# Patient Record
Sex: Female | Born: 1980 | Race: White | Hispanic: No | Smoking: Never smoker
Health system: Southern US, Community
[De-identification: ages and names within clinical notes are randomized; demographics above are authoritative.]

## PROBLEM LIST (undated history)

## (undated) DIAGNOSIS — D649 Anemia, unspecified: Secondary | ICD-10-CM

## (undated) DIAGNOSIS — X12XXXA Contact with other hot fluids, initial encounter: Secondary | ICD-10-CM

## (undated) DIAGNOSIS — N390 Urinary tract infection, site not specified: Secondary | ICD-10-CM

## (undated) DIAGNOSIS — I959 Hypotension, unspecified: Secondary | ICD-10-CM

## (undated) DIAGNOSIS — F419 Anxiety disorder, unspecified: Secondary | ICD-10-CM

## (undated) DIAGNOSIS — F329 Major depressive disorder, single episode, unspecified: Secondary | ICD-10-CM

## (undated) DIAGNOSIS — T7840XA Allergy, unspecified, initial encounter: Secondary | ICD-10-CM

## (undated) DIAGNOSIS — B019 Varicella without complication: Secondary | ICD-10-CM

## (undated) DIAGNOSIS — F32A Depression, unspecified: Secondary | ICD-10-CM

## (undated) DIAGNOSIS — G43909 Migraine, unspecified, not intractable, without status migrainosus: Secondary | ICD-10-CM

## (undated) DIAGNOSIS — T300 Burn of unspecified body region, unspecified degree: Secondary | ICD-10-CM

## (undated) DIAGNOSIS — E11610 Type 2 diabetes mellitus with diabetic neuropathic arthropathy: Secondary | ICD-10-CM

## (undated) HISTORY — PX: OTHER SURGICAL HISTORY: SHX169

## (undated) HISTORY — PX: TONSILLECTOMY: SUR1361

## (undated) HISTORY — PX: BELOW KNEE LEG AMPUTATION: SUR23

## (undated) HISTORY — DX: Allergy, unspecified, initial encounter: T78.40XA

## (undated) HISTORY — DX: Urinary tract infection, site not specified: N39.0

## (undated) HISTORY — DX: Varicella without complication: B01.9

## (undated) HISTORY — DX: Depression, unspecified: F32.A

## (undated) HISTORY — DX: Migraine, unspecified, not intractable, without status migrainosus: G43.909

## (undated) HISTORY — DX: Type 2 diabetes mellitus with diabetic neuropathic arthropathy: E11.610

## (undated) HISTORY — DX: Contact with other hot fluids, initial encounter: X12.XXXA

## (undated) HISTORY — DX: Burn of unspecified body region, unspecified degree: T30.0

## (undated) HISTORY — DX: Major depressive disorder, single episode, unspecified: F32.9

---

## 1998-05-06 ENCOUNTER — Encounter: Admission: RE | Admit: 1998-05-06 | Discharge: 1998-08-04 | Payer: Self-pay | Admitting: Rheumatology

## 1998-08-31 ENCOUNTER — Other Ambulatory Visit: Admission: RE | Admit: 1998-08-31 | Discharge: 1998-08-31 | Payer: Self-pay | Admitting: *Deleted

## 1999-04-21 ENCOUNTER — Other Ambulatory Visit: Admission: RE | Admit: 1999-04-21 | Discharge: 1999-04-21 | Payer: Self-pay | Admitting: Internal Medicine

## 1999-09-05 ENCOUNTER — Inpatient Hospital Stay (HOSPITAL_COMMUNITY): Admission: RE | Admit: 1999-09-05 | Discharge: 1999-09-10 | Payer: Self-pay | Admitting: Neurosurgery

## 2001-07-22 ENCOUNTER — Other Ambulatory Visit: Admission: RE | Admit: 2001-07-22 | Discharge: 2001-07-22 | Payer: Self-pay | Admitting: Internal Medicine

## 2002-06-17 ENCOUNTER — Encounter: Payer: Self-pay | Admitting: Emergency Medicine

## 2002-06-17 ENCOUNTER — Inpatient Hospital Stay (HOSPITAL_COMMUNITY): Admission: EM | Admit: 2002-06-17 | Discharge: 2002-06-18 | Payer: Self-pay | Admitting: Emergency Medicine

## 2002-06-17 ENCOUNTER — Encounter: Payer: Self-pay | Admitting: *Deleted

## 2002-09-23 ENCOUNTER — Other Ambulatory Visit: Admission: RE | Admit: 2002-09-23 | Discharge: 2002-09-23 | Payer: Self-pay | Admitting: Internal Medicine

## 2003-09-09 ENCOUNTER — Encounter: Admission: RE | Admit: 2003-09-09 | Discharge: 2003-09-09 | Payer: Self-pay | Admitting: *Deleted

## 2003-10-20 ENCOUNTER — Other Ambulatory Visit: Admission: RE | Admit: 2003-10-20 | Discharge: 2003-10-20 | Payer: Self-pay | Admitting: Internal Medicine

## 2003-12-21 ENCOUNTER — Emergency Department (HOSPITAL_COMMUNITY): Admission: AD | Admit: 2003-12-21 | Discharge: 2003-12-21 | Payer: Self-pay | Admitting: Family Medicine

## 2004-01-28 ENCOUNTER — Encounter: Admission: RE | Admit: 2004-01-28 | Discharge: 2004-01-28 | Payer: Self-pay | Admitting: Allergy and Immunology

## 2004-08-01 ENCOUNTER — Emergency Department (HOSPITAL_COMMUNITY): Admission: EM | Admit: 2004-08-01 | Discharge: 2004-08-02 | Payer: Self-pay | Admitting: Emergency Medicine

## 2004-08-17 ENCOUNTER — Emergency Department (HOSPITAL_COMMUNITY): Admission: AC | Admit: 2004-08-17 | Discharge: 2004-08-17 | Payer: Self-pay

## 2004-09-30 ENCOUNTER — Inpatient Hospital Stay (HOSPITAL_COMMUNITY): Admission: AD | Admit: 2004-09-30 | Discharge: 2004-10-04 | Payer: Self-pay | Admitting: Internal Medicine

## 2004-12-20 ENCOUNTER — Other Ambulatory Visit: Admission: RE | Admit: 2004-12-20 | Discharge: 2004-12-20 | Payer: Self-pay | Admitting: Internal Medicine

## 2005-03-02 ENCOUNTER — Encounter: Admission: RE | Admit: 2005-03-02 | Discharge: 2005-05-31 | Payer: Self-pay | Admitting: Endocrinology

## 2005-03-15 ENCOUNTER — Emergency Department (HOSPITAL_COMMUNITY): Admission: EM | Admit: 2005-03-15 | Discharge: 2005-03-16 | Payer: Self-pay | Admitting: Emergency Medicine

## 2005-11-18 ENCOUNTER — Emergency Department (HOSPITAL_COMMUNITY): Admission: EM | Admit: 2005-11-18 | Discharge: 2005-11-19 | Payer: Self-pay | Admitting: Emergency Medicine

## 2005-11-22 ENCOUNTER — Inpatient Hospital Stay (HOSPITAL_COMMUNITY): Admission: EM | Admit: 2005-11-22 | Discharge: 2005-11-24 | Payer: Self-pay | Admitting: Emergency Medicine

## 2005-12-06 ENCOUNTER — Emergency Department (HOSPITAL_COMMUNITY): Admission: EM | Admit: 2005-12-06 | Discharge: 2005-12-06 | Payer: Self-pay | Admitting: Emergency Medicine

## 2005-12-21 ENCOUNTER — Other Ambulatory Visit: Admission: RE | Admit: 2005-12-21 | Discharge: 2005-12-21 | Payer: Self-pay | Admitting: Internal Medicine

## 2006-01-02 ENCOUNTER — Emergency Department (HOSPITAL_COMMUNITY): Admission: EM | Admit: 2006-01-02 | Discharge: 2006-01-02 | Payer: Self-pay | Admitting: Emergency Medicine

## 2006-01-07 ENCOUNTER — Emergency Department (HOSPITAL_COMMUNITY): Admission: EM | Admit: 2006-01-07 | Discharge: 2006-01-08 | Payer: Self-pay | Admitting: Emergency Medicine

## 2006-01-10 ENCOUNTER — Emergency Department (HOSPITAL_COMMUNITY): Admission: EM | Admit: 2006-01-10 | Discharge: 2006-01-10 | Payer: Self-pay | Admitting: Emergency Medicine

## 2006-01-14 ENCOUNTER — Emergency Department (HOSPITAL_COMMUNITY): Admission: EM | Admit: 2006-01-14 | Discharge: 2006-01-15 | Payer: Self-pay | Admitting: Emergency Medicine

## 2006-07-24 ENCOUNTER — Ambulatory Visit (HOSPITAL_COMMUNITY): Admission: RE | Admit: 2006-07-24 | Discharge: 2006-07-24 | Payer: Self-pay | Admitting: Obstetrics and Gynecology

## 2006-09-01 ENCOUNTER — Inpatient Hospital Stay (HOSPITAL_COMMUNITY): Admission: AD | Admit: 2006-09-01 | Discharge: 2006-09-01 | Payer: Self-pay | Admitting: Obstetrics and Gynecology

## 2006-10-08 ENCOUNTER — Inpatient Hospital Stay (HOSPITAL_COMMUNITY): Admission: AD | Admit: 2006-10-08 | Discharge: 2006-10-08 | Payer: Self-pay | Admitting: Obstetrics and Gynecology

## 2006-10-09 ENCOUNTER — Inpatient Hospital Stay (HOSPITAL_COMMUNITY): Admission: AD | Admit: 2006-10-09 | Discharge: 2006-10-12 | Payer: Self-pay | Admitting: Obstetrics and Gynecology

## 2006-10-10 ENCOUNTER — Encounter: Payer: Self-pay | Admitting: Vascular Surgery

## 2006-10-14 ENCOUNTER — Inpatient Hospital Stay (HOSPITAL_COMMUNITY): Admission: AD | Admit: 2006-10-14 | Discharge: 2006-10-14 | Payer: Self-pay | Admitting: Obstetrics and Gynecology

## 2006-10-16 ENCOUNTER — Inpatient Hospital Stay (HOSPITAL_COMMUNITY): Admission: AD | Admit: 2006-10-16 | Discharge: 2006-10-17 | Payer: Self-pay | Admitting: Obstetrics and Gynecology

## 2006-10-31 ENCOUNTER — Inpatient Hospital Stay (HOSPITAL_COMMUNITY): Admission: AD | Admit: 2006-10-31 | Discharge: 2006-11-01 | Payer: Self-pay | Admitting: Obstetrics and Gynecology

## 2006-11-08 ENCOUNTER — Inpatient Hospital Stay (HOSPITAL_COMMUNITY): Admission: AD | Admit: 2006-11-08 | Discharge: 2006-11-14 | Payer: Self-pay | Admitting: Obstetrics and Gynecology

## 2006-11-09 ENCOUNTER — Encounter (INDEPENDENT_AMBULATORY_CARE_PROVIDER_SITE_OTHER): Payer: Self-pay | Admitting: Specialist

## 2007-12-23 ENCOUNTER — Inpatient Hospital Stay (HOSPITAL_COMMUNITY): Admission: EM | Admit: 2007-12-23 | Discharge: 2007-12-24 | Payer: Self-pay | Admitting: Emergency Medicine

## 2009-03-07 ENCOUNTER — Inpatient Hospital Stay (HOSPITAL_COMMUNITY): Admission: EM | Admit: 2009-03-07 | Discharge: 2009-03-09 | Payer: Self-pay | Admitting: Emergency Medicine

## 2010-07-01 ENCOUNTER — Inpatient Hospital Stay (HOSPITAL_COMMUNITY): Admission: EM | Admit: 2010-07-01 | Discharge: 2010-07-03 | Payer: Self-pay | Admitting: Emergency Medicine

## 2010-11-30 LAB — CBC
HCT: 33.1 % — ABNORMAL LOW (ref 36.0–46.0)
HCT: 33.6 % — ABNORMAL LOW (ref 36.0–46.0)
Hemoglobin: 11.4 g/dL — ABNORMAL LOW (ref 12.0–15.0)
MCH: 31.5 pg (ref 26.0–34.0)
MCHC: 34.6 g/dL (ref 30.0–36.0)
RDW: 12.6 % (ref 11.5–15.5)
WBC: 6 10*3/uL (ref 4.0–10.5)

## 2010-11-30 LAB — GLUCOSE, CAPILLARY
Glucose-Capillary: 106 mg/dL — ABNORMAL HIGH (ref 70–99)
Glucose-Capillary: 121 mg/dL — ABNORMAL HIGH (ref 70–99)
Glucose-Capillary: 136 mg/dL — ABNORMAL HIGH (ref 70–99)
Glucose-Capillary: 182 mg/dL — ABNORMAL HIGH (ref 70–99)
Glucose-Capillary: 195 mg/dL — ABNORMAL HIGH (ref 70–99)
Glucose-Capillary: 199 mg/dL — ABNORMAL HIGH (ref 70–99)
Glucose-Capillary: 391 mg/dL — ABNORMAL HIGH (ref 70–99)
Glucose-Capillary: 75 mg/dL (ref 70–99)

## 2010-11-30 LAB — BASIC METABOLIC PANEL
BUN: 10 mg/dL (ref 6–23)
BUN: 13 mg/dL (ref 6–23)
CO2: 19 mEq/L (ref 19–32)
CO2: 21 mEq/L (ref 19–32)
Chloride: 112 mEq/L (ref 96–112)
Chloride: 112 mEq/L (ref 96–112)
Creatinine, Ser: 0.86 mg/dL (ref 0.4–1.2)
GFR calc Af Amer: >60 mL/min (ref >60)
GFR calc non Af Amer: >60 mL/min (ref >60)
Potassium: 3.5 mEq/L (ref 3.5–5.1)
Sodium: 136 mEq/L (ref 135–145)
Sodium: 137 mEq/L (ref 135–145)

## 2010-12-01 LAB — BASIC METABOLIC PANEL
BUN: 15 mg/dL (ref 6–23)
BUN: 17 mg/dL (ref 6–23)
CO2: 10 mEq/L — ABNORMAL LOW (ref 19–32)
Calcium: 9.3 mg/dL (ref 8.4–10.5)
Chloride: 107 mEq/L (ref 96–112)
Chloride: 111 mEq/L (ref 96–112)
Creatinine, Ser: 1.28 mg/dL — ABNORMAL HIGH (ref 0.4–1.2)
GFR calc Af Amer: 33 mL/min — ABNORMAL LOW (ref >60)
GFR calc non Af Amer: 27 mL/min — ABNORMAL LOW (ref >60)
GFR calc non Af Amer: 56 mL/min — ABNORMAL LOW (ref >60)
Glucose, Bld: 241 mg/dL — ABNORMAL HIGH (ref 70–99)
Potassium: 3.9 mEq/L (ref 3.5–5.1)
Potassium: 4.5 mEq/L (ref 3.5–5.1)
Sodium: 125 mEq/L — ABNORMAL LOW (ref 135–145)
Sodium: 135 mEq/L (ref 135–145)

## 2010-12-01 LAB — GLUCOSE, CAPILLARY
Glucose-Capillary: 241 mg/dL — ABNORMAL HIGH (ref 70–99)
Glucose-Capillary: 245 mg/dL — ABNORMAL HIGH (ref 70–99)
Glucose-Capillary: 328 mg/dL — ABNORMAL HIGH (ref 70–99)
Glucose-Capillary: 367 mg/dL — ABNORMAL HIGH (ref 70–99)
Glucose-Capillary: 495 mg/dL — ABNORMAL HIGH (ref 70–99)

## 2010-12-01 LAB — URINE MICROSCOPIC-ADD ON

## 2010-12-01 LAB — DIFFERENTIAL
Basophils Absolute: 0 10*3/uL (ref 0.0–0.1)
Lymphocytes Relative: 7 % — ABNORMAL LOW (ref 12–46)
Lymphs Abs: 1.3 10*3/uL (ref 0.7–4.0)
Neutro Abs: 16 10*3/uL — ABNORMAL HIGH (ref 1.7–7.7)
Neutrophils Relative %: 88 % — ABNORMAL HIGH (ref 43–77)

## 2010-12-01 LAB — CBC
Hemoglobin: 15 g/dL (ref 12.0–15.0)
MCHC: 33.9 g/dL (ref 30.0–36.0)
RBC: 4.69 MIL/uL (ref 3.87–5.11)
RDW: 12.6 % (ref 11.5–15.5)
WBC: 18.2 10*3/uL — ABNORMAL HIGH (ref 4.0–10.5)

## 2010-12-01 LAB — URINALYSIS, ROUTINE W REFLEX MICROSCOPIC
Bilirubin Urine: NEGATIVE
Glucose, UA: >1000 mg/dL — AB
Specific Gravity, Urine: 1.03 (ref 1.005–1.030)
Urobilinogen, UA: 0.2 mg/dL (ref 0.0–1.0)
pH: 5 (ref 5.0–8.0)

## 2010-12-01 LAB — LIPASE, BLOOD: Lipase: 41 U/L (ref 11–59)

## 2010-12-01 LAB — MRSA PCR SCREENING: MRSA by PCR: NEGATIVE

## 2010-12-01 LAB — PREGNANCY, URINE: Preg Test, Ur: NEGATIVE

## 2010-12-14 ENCOUNTER — Observation Stay (HOSPITAL_COMMUNITY)
Admission: EM | Admit: 2010-12-14 | Discharge: 2010-12-14 | Disposition: A | Discharge status: Home / Self Care | Payer: PRIVATE HEALTH INSURANCE | Attending: Emergency Medicine | Admitting: Emergency Medicine

## 2010-12-14 ENCOUNTER — Emergency Department (HOSPITAL_COMMUNITY): Payer: PRIVATE HEALTH INSURANCE

## 2010-12-14 DIAGNOSIS — E109 Type 1 diabetes mellitus without complications: Principal | ICD-10-CM | POA: Insufficient documentation

## 2010-12-14 DIAGNOSIS — R631 Polydipsia: Secondary | ICD-10-CM | POA: Insufficient documentation

## 2010-12-14 DIAGNOSIS — R3589 Other polyuria: Secondary | ICD-10-CM | POA: Insufficient documentation

## 2010-12-14 DIAGNOSIS — R358 Other polyuria: Secondary | ICD-10-CM | POA: Insufficient documentation

## 2010-12-14 DIAGNOSIS — Z794 Long term (current) use of insulin: Secondary | ICD-10-CM | POA: Insufficient documentation

## 2010-12-14 LAB — COMPREHENSIVE METABOLIC PANEL
ALT: 41 U/L — ABNORMAL HIGH (ref 0–35)
AST: 21 U/L (ref 0–37)
Albumin: 3.3 g/dL — ABNORMAL LOW (ref 3.5–5.2)
Alkaline Phosphatase: 85 U/L (ref 39–117)
BUN: 10 mg/dL (ref 6–23)
CO2: 20 mEq/L (ref 19–32)
Calcium: 8.7 mg/dL (ref 8.4–10.5)
Chloride: 104 mEq/L (ref 96–112)
Creatinine, Ser: 0.9 mg/dL (ref 0.4–1.2)
GFR calc Af Amer: >60 mL/min (ref >60)
GFR calc non Af Amer: >60 mL/min (ref >60)
Glucose, Bld: 306 mg/dL — ABNORMAL HIGH (ref 70–99)
Potassium: 4 mEq/L (ref 3.5–5.1)
Sodium: 133 mEq/L — ABNORMAL LOW (ref 135–145)
Total Bilirubin: 1.1 mg/dL (ref 0.3–1.2)
Total Protein: 6.3 g/dL (ref 6.0–8.3)

## 2010-12-14 LAB — GLUCOSE, CAPILLARY
Glucose-Capillary: 545 mg/dL — ABNORMAL HIGH (ref 70–99)
Glucose-Capillary: 364 mg/dL — ABNORMAL HIGH (ref 70–99)
Glucose-Capillary: 195 mg/dL — ABNORMAL HIGH (ref 70–99)

## 2010-12-14 LAB — DIFFERENTIAL
Basophils Absolute: 0 10*3/uL (ref 0.0–0.1)
Basophils Relative: 0 % (ref 0–1)
Eosinophils Absolute: 0 10*3/uL (ref 0.0–0.7)
Eosinophils Relative: 0 % (ref 0–5)
Lymphocytes Relative: 28 % (ref 12–46)
Lymphs Abs: 2 10*3/uL (ref 0.7–4.0)
Monocytes Absolute: 0.8 10*3/uL (ref 0.1–1.0)
Monocytes Relative: 11 % (ref 3–12)
Neutro Abs: 4.3 10*3/uL (ref 1.7–7.7)
Neutrophils Relative %: 61 % (ref 43–77)

## 2010-12-14 LAB — BASIC METABOLIC PANEL
CO2: 13 mEq/L — ABNORMAL LOW (ref 19–32)
Calcium: 9.2 mg/dL (ref 8.4–10.5)
Creatinine, Ser: 1.27 mg/dL — ABNORMAL HIGH (ref 0.4–1.2)
GFR calc Af Amer: 59 mL/min — ABNORMAL LOW (ref >60)
GFR calc non Af Amer: 49 mL/min — ABNORMAL LOW (ref >60)
Sodium: 126 mEq/L — ABNORMAL LOW (ref 135–145)

## 2010-12-14 LAB — URINALYSIS, ROUTINE W REFLEX MICROSCOPIC
Nitrite: NEGATIVE
Protein, ur: 30 mg/dL — AB
Specific Gravity, Urine: 1.043 — ABNORMAL HIGH (ref 1.005–1.030)
Urobilinogen, UA: 0.2 mg/dL (ref 0.0–1.0)

## 2010-12-14 LAB — URINE MICROSCOPIC-ADD ON

## 2010-12-14 LAB — POCT PREGNANCY, URINE: Preg Test, Ur: NEGATIVE

## 2010-12-14 LAB — CBC
MCH: 32.8 pg (ref 26.0–34.0)
MCHC: 36.4 g/dL — ABNORMAL HIGH (ref 30.0–36.0)
Platelets: 359 10*3/uL (ref 150–400)
RDW: 11.9 % (ref 11.5–15.5)

## 2010-12-26 LAB — GLUCOSE, CAPILLARY
Glucose-Capillary: 122 mg/dL — ABNORMAL HIGH (ref 70–99)
Glucose-Capillary: 146 mg/dL — ABNORMAL HIGH (ref 70–99)
Glucose-Capillary: 157 mg/dL — ABNORMAL HIGH (ref 70–99)
Glucose-Capillary: 157 mg/dL — ABNORMAL HIGH (ref 70–99)
Glucose-Capillary: 160 mg/dL — ABNORMAL HIGH (ref 70–99)
Glucose-Capillary: 162 mg/dL — ABNORMAL HIGH (ref 70–99)
Glucose-Capillary: 168 mg/dL — ABNORMAL HIGH (ref 70–99)
Glucose-Capillary: 178 mg/dL — ABNORMAL HIGH (ref 70–99)
Glucose-Capillary: 248 mg/dL — ABNORMAL HIGH (ref 70–99)
Glucose-Capillary: 280 mg/dL — ABNORMAL HIGH (ref 70–99)
Glucose-Capillary: 296 mg/dL — ABNORMAL HIGH (ref 70–99)
Glucose-Capillary: 352 mg/dL — ABNORMAL HIGH (ref 70–99)
Glucose-Capillary: 507 mg/dL (ref 70–99)
Glucose-Capillary: 523 mg/dL (ref 70–99)
Glucose-Capillary: 562 mg/dL (ref 70–99)

## 2010-12-26 LAB — CBC
HCT: 38.7 % (ref 36.0–46.0)
HCT: 45.4 % (ref 36.0–46.0)
Hemoglobin: 16 g/dL — ABNORMAL HIGH (ref 12.0–15.0)
MCHC: 35.3 g/dL (ref 30.0–36.0)
MCV: 91.3 fL (ref 78.0–100.0)
Platelets: 334 10*3/uL (ref 150–400)
RBC: 4.18 MIL/uL (ref 3.87–5.11)
RBC: 4.98 MIL/uL (ref 3.87–5.11)
WBC: 7.4 10*3/uL (ref 4.0–10.5)

## 2010-12-26 LAB — BASIC METABOLIC PANEL
BUN: 14 mg/dL (ref 6–23)
BUN: 16 mg/dL (ref 6–23)
BUN: 20 mg/dL (ref 6–23)
CO2: 20 mEq/L (ref 19–32)
CO2: 21 mEq/L (ref 19–32)
CO2: 21 mEq/L (ref 19–32)
CO2: 9 mEq/L — CL (ref 19–32)
Calcium: 8.3 mg/dL — ABNORMAL LOW (ref 8.4–10.5)
Calcium: 8.3 mg/dL — ABNORMAL LOW (ref 8.4–10.5)
Calcium: 9.6 mg/dL (ref 8.4–10.5)
Chloride: 103 mEq/L (ref 96–112)
Chloride: 108 mEq/L (ref 96–112)
Chloride: 97 mEq/L (ref 96–112)
Chloride: 98 mEq/L (ref 96–112)
Creatinine, Ser: 0.74 mg/dL (ref 0.4–1.2)
Creatinine, Ser: 0.81 mg/dL (ref 0.4–1.2)
Creatinine, Ser: 0.82 mg/dL (ref 0.4–1.2)
Creatinine, Ser: 0.93 mg/dL (ref 0.4–1.2)
Creatinine, Ser: 1.15 mg/dL (ref 0.4–1.2)
Creatinine, Ser: 1.17 mg/dL (ref 0.4–1.2)
Creatinine, Ser: 1.32 mg/dL — ABNORMAL HIGH (ref 0.4–1.2)
GFR calc Af Amer: >60 mL/min (ref >60)
GFR calc Af Amer: >60 mL/min (ref >60)
GFR calc Af Amer: >60 mL/min (ref >60)
GFR calc Af Amer: >60 mL/min (ref >60)
GFR calc non Af Amer: 55 mL/min — ABNORMAL LOW (ref >60)
GFR calc non Af Amer: >60 mL/min (ref >60)
Glucose, Bld: 216 mg/dL — ABNORMAL HIGH (ref 70–99)
Glucose, Bld: 410 mg/dL — ABNORMAL HIGH (ref 70–99)
Glucose, Bld: 487 mg/dL — ABNORMAL HIGH (ref 70–99)
Potassium: 3.2 mEq/L — ABNORMAL LOW (ref 3.5–5.1)
Potassium: 3.6 mEq/L (ref 3.5–5.1)
Potassium: 3.8 mEq/L (ref 3.5–5.1)
Potassium: 4 mEq/L (ref 3.5–5.1)
Sodium: 128 mEq/L — ABNORMAL LOW (ref 135–145)
Sodium: 134 mEq/L — ABNORMAL LOW (ref 135–145)

## 2010-12-26 LAB — POCT I-STAT, CHEM 8
Calcium, Ion: 1.13 mmol/L (ref 1.12–1.32)
Glucose, Bld: 437 mg/dL — ABNORMAL HIGH (ref 70–99)
HCT: 48 % — ABNORMAL HIGH (ref 36.0–46.0)
TCO2: 10 mmol/L (ref 0–100)

## 2010-12-26 LAB — POCT I-STAT 3, VENOUS BLOOD GAS (G3P V)
Acid-base deficit: 12 mmol/L — ABNORMAL HIGH (ref 0.0–2.0)
O2 Saturation: 97 %
Patient temperature: 98.4
TCO2: 10 mmol/L (ref 0–100)

## 2010-12-26 LAB — DIFFERENTIAL
Basophils Relative: 0 % (ref 0–1)
Eosinophils Absolute: 0 10*3/uL (ref 0.0–0.7)
Eosinophils Relative: 0 % (ref 0–5)
Monocytes Absolute: 0.7 10*3/uL (ref 0.1–1.0)
Monocytes Relative: 8 % (ref 3–12)
Neutrophils Relative %: 66 % (ref 43–77)

## 2010-12-26 LAB — URINALYSIS, ROUTINE W REFLEX MICROSCOPIC
Bilirubin Urine: NEGATIVE
Nitrite: NEGATIVE
Protein, ur: NEGATIVE mg/dL
Specific Gravity, Urine: 1.031 — ABNORMAL HIGH (ref 1.005–1.030)
Urobilinogen, UA: 0.2 mg/dL (ref 0.0–1.0)

## 2010-12-26 LAB — URINE MICROSCOPIC-ADD ON

## 2011-01-31 NOTE — H&P (Signed)
NAMEMALI, EPPARD NO.:  [id number]   MEDICAL RECORD NO.:  [id number]          PATIENT TYPE:  INP   LOCATION:  1823                         FACILITY:  [facility]   PHYSICIAN:  Hollice Espy, M.D.DATE OF BIRTH:  1981-05-01   DATE OF ADMISSION:  03/07/2009  DATE OF DISCHARGE:                              HISTORY & PHYSICAL   PCP:  Dr. Debara Pickett, endocrinology.  She also goes to [place] at [facility].   CHIEF COMPLAINT:  Elevated blood sugars.   HISTORY OF PRESENT ILLNESS:  The patient is a 30 year old white female  with a past medical history of diabetes mellitus type 1 who had recently  exchanged her diabetic insulin pump after it had been not working;  however, this pump itself today, when she looked at it, it was reported  nonfunctioning.  She tried to give herself doses of subcutaneous insulin  but her sugars continued to elevate, she came into the emergency room.  She at that time was found to have a blood sugar of 437 and I-STAT labs  done, however, an actual BMET was not done.  However, between the ABG  and the I-STAT, it looked like her anion gap was 15.  The patient was  started on a glucomander, IV insulin and IV fluids.  When I saw her she  was complaining of just some mild generalized weakness.  She denied any  headaches, vision changes, dysphagia, chest pain, palpitations,  shortness breath, wheeze, cough, abdominal pain, hematuria, dysuria,  constipation, diarrhea, focal weakness or numbness, weakness or pain.   REVIEW OF SYSTEMS:  Otherwise negative.   PAST MEDICAL HISTORY:  Diabetes mellitus type 1 with insulin pump.   MEDICATIONS:  She is just on an insulin pump.   ALLERGIES:  She is allergic to CODEINE.   SOCIAL HISTORY:  She denies any tobacco, alcohol or drug use.   FAMILY HISTORY:  Noncontributory.   PHYSICAL EXAMINATION:  VITALS ON ADMISSION:  Temperature 98.4, heart  rate 133, now down to 106, blood pressure 115/85 now down to  102/65,  respirations 25, O2 saturation 98% on room air.  GENERAL:  She is alert and oriented, slightly drowsy but able to awaken  and answer questions appropriately.  HEENT:  Normocephalic, atraumatic.  Her mucous membranes are dry.  She  has no carotid bruits.  HEART:  Regular rate and rhythm, normal S1-S2.  LUNGS:  Clear to auscultation bilaterally.  ABDOMEN:  Soft, nontender, nondistended with positive bowel sounds.  EXTREMITIES:  No clubbing, cyanosis or edema.   LAB WORK:  UA notes greater than 1000 glucose, trace hemoglobin, greater  than 80 ketones but the micro does note many yeast.  PH 7.37, pCO2 17,  pO2 85, bicarbonate 10, sodium 130, potassium 4.1, chloride 105, BUN is  24, there was no bicarbonate drawn, creatinine 1.1.  The follow-up  glucose at 212, when the insulin pump and glucomander started it was  248.   ASSESSMENT AND PLAN:  1. Diabetic ketoacidosis.  Check a follow-up BMET now.  It looks like      because of her subcutaneous  insulin self-administering her acidosis      will be able to be quickly reversed.  Will keep her nothing by      mouth until then.  Will observe her overnight.  Fluids once her      anion gap and acidosis resolve.  Will discuss with Dr. Sharl Ma, her      primary care physician, in the morning as to what we should do      about her pump.  2. Yeast urinary tract infection.  Will go ahead and put her on some      IV Diflucan, change over to by mouth once she is able to take by      mouth.      Hollice Espy, M.D.  Electronically Signed     SKK/MEDQ  D:  03/07/2009  T:  03/07/2009  Job:  [id number]   cc:   Tonita Cong, M.D.

## 2011-01-31 NOTE — Discharge Summary (Signed)
NAMESEMAJA, [name] NO.:  [id number]   MEDICAL RECORD NO.:  [id number]          PATIENT TYPE:  INP   LOCATION:  5125                         FACILITY:  [facility]   PHYSICIAN:  Veverly Fells. Altheimer, M.D.DATE OF BIRTH:  1981/03/30   DATE OF ADMISSION:  12/22/2007  DATE OF DISCHARGE:  12/24/2007                               DISCHARGE SUMMARY   REASON FOR ADMISSION:  Type 1 diabetes with severe hyperglycemia and  dehydration.   HISTORY:  This is a 30 year old woman with type 1 diabetes diagnosed in  January 2006 with information as below.  She has been in poor control  with A1c's chronically above 10, most recently 10.9 on November 21, 2007.  She is on an insulin pump with basal rates 0.9 units per hour starting  at midnight, 1.50 units per hour starting at 6:00 a.m., and 1.30 units  per hour starting at 6:00 p.m.  She boluses 1 unit per 6 grams of  carbohydrates plus a correction of 1 unit per 30 mg/dL above 147 mg/dL  using the bolus wizard.  She also does have a correction bolus at  bedtime when she is above 120.  It turns out that she has not been  changing her infusion set every 3 days as she had been instructed.  At  time, she was trying to stretch them out much longer than that because  of not currently having insurance coverage.  She tests her blood sugars  8 or more times a day with extremely labile results, typically ranging  from 50-500 or so.  She has done continuous glucose monitoring for 3  days at the time, a couple times in the past, but has been unable to get  a continuous glucose monitor for continuous use due to lack of insurance  coverage.  She has occasional mild hypoglycemic episodes with no history  of severe episodes.   Two days prior to admission, she discovered a vent infusion set cannula.  She believes that she was not getting her p.m. insulin infusion at least  the latter part of that day and into the day prior to admission.  She  tried  extra bolusing and converted to insulin injections via syringe.  She eventually changed her infusion set, but the pump gave a no  delivery error message.  Her sugars escalated to probably above 500 and  she felt progressively dehydrated and developed some nausea.  She had  one episode of vomiting.  She became weak and somewhat confused and  presented to the emergency room where she was found to have a glucose of  469 with sodium 128, potassium 3.8 and CO2 of 24.  She was admitted by  Dr. Dagoberto Ligas for management as below.   PAST MEDICAL HISTORY:  1. Diabetes mellitus type 1 as above.  She was diagnosed in January      2006 when she presented with a 10-day history of classic      hyperglycemic symptoms and blood sugar 421.  C-peptide then was      1.3.  She was initially hospitalized for 1 week.  By  mid 2006, she      was chronically in poor control.  Prior to starting the insulin      pump, her sugars were often over 200 fasting and sometimes over 600      postprandial.  She had a random blood sugar of 703 in April 2007      under routine physician appointment.  She had about 20 emergency      room visits for extreme hyperglycemia in 2006 and 2007, and a      couple of hospitalizations for hyperglycemia in 2007, once for 4      days with a blood sugar of 891.  She was diagnosed with unplanned      pregnancy on April 24, 2006.  At which time, her diabetes was      severely out of control despite insulin pump.  She initially saw me      on April 26, 2006, at which time her A1c was 9.3%.  With aggressive      management and frequent followups, we got this down to 6.1% on      July 24, 2006, and she maintained good control through her      pregnancy with A1cs in the low 6 range.  She delivered on November 09, 2006, and her baby was healthy.  However, she was subsequently      lost to follow up until May 2008 when she returned with hemoglobin      A1c of 13.4%.  Since then, it had come  down slowly to 10.9% as of      March 2009.  2. History of hypertension during pregnancy which at that time was      treated with labetalol by Dr. Estanislado Pandy.  3. Contraception with Depo-Provera currently.  4. Mild dyslipidemia.  5. Migraines.  6. Allergic rhinitis.  7. Status post decompression of Chiari malformation December 2000 per      Dr. Lovell Sheehan.  8. History of depression, anxiety, and situational stress.   DRUG SENSITIVITIES:  Codeine aggravated headache.   MEDICATIONS ON ADMISSION:  1. NovoLog via MiniMed Paradigm 522 insulin pump with basal and bolus      dosing as above.  2. Depo-Provera for contraception.  3. Imitrex 100 mg p.r.n.  4. Flexeril 5 mg p.r.n. migraines.  5. Allegra 180 mg daily.  She was previously on Lexapro from 2005 through 2008.   SOCIAL HISTORY:  She lives with her husband.  She is a Runner, broadcasting/film/video.  She  does not smoke or drink significant amounts of alcohol.   PHYSICAL EXAMINATION:  GENERAL:  Exam on admission by Dr. Dagoberto Ligas noted  her to be in no acute distress, although mildly confused and generally  weak.  HEENT:  Tongue was dry.  VITAL SIGNS:  She is afebrile with  stable vital signs.  LUNGS:  Unlabored and clear.  HEART:  Regular  without murmur.  ABDOMEN:  Soft without tenderness.  NEUROLOGIC:  There  were no focal neurologic deficits.   LABORATORY ON ADMISSION:  Pertinent for the chemistries as noted above.   HOSPITAL COURSE:  She was treated in the emergency room with saline.  She was admitted by Dr. Dagoberto Ligas and started on sips of water, advanced to  clear liquids.  Saline was continued at 250 an hour for 2 liters.  She  was given Humalog 10 units followed by sensitive scale.  CBGs were  followed every 3 hours.  By December 23, 2007, she was feeling some better,  still with mild nausea, nonspecific headaches and generalized weakness  and dry mouth.  Lab glucose was down to 157 with CO2 remaining normal at  25 and potassium at 3.6.  CBG was down to 126.   She was continued on  normal saline at 200 an hour and 100 an hour, and diet was advanced and  well tolerated.  She was given NPH 12 units on the morning of December 23, 2007, and continued on NovoLog scale.   She was seen by the diabetes coordinator on December 23, 2007, who found  that the patient was alert and able to manage her insulin pump.  At that  point, the patient admitted that she had not been changing her infusion  set every 3 days as instructed because of not having insurance.  This  was determined to be the cause of her insulin pump no delivery alarms,  which she had also experienced from time to time in the past.  Pump  troubleshooting was done by the diabetes coordinator, including a phone  call to [employer] technical support with additional troubleshooting.  However, the pump was determined to be working properly.  She was  resumed on her insulin pump on the afternoon of December 23, 2007, at which  time her CBG was 204.  Her basal rates were at her previous settings and  she bolused appropriately according to her CBGs.  It was emphasized to  her that she must change her infusion sets at least every 3 days and she  acknowledges that she would.  She was given the information about [employer] Patient Assistance Program.   By December 24, 2007, she is feeling much better with no complaints.  She  was tolerating her diet and dry mouth had resolved.  CBGs had ranged  from 279 down to 86 on the morning of discharge.  Lab glucose was 93.  Potassium was 3.2 and she is to receive 40 mEq prior to discharge.   FINAL DIAGNOSES:  1. Type 1 diabetes mellitus, chronically out of control, with acute      hyperglycemia due to infusion set malfunction resulting in      dehydration.  2. Prior history of hypertension during pregnancy, subsequently      normotensive.  3. Gravida 1, para 1 delivered on November 09, 2006, currently on Depo-      Provera contraception.  4. Dyslipidemia.  5.  Migraines.  6. Allergic rhinitis.  7. Status post decompression of Chiari malformation in December 2000.  8. History of depression, anxiety, and situational stress.   DISCHARGE PLAN:  She is to continue her MiniMed insulin pump at her  prior basal rate and boluses.  She is to see our diabetes nurse  coordinator in our office at noon today to review her insulin pump  techniques and insulin adjustment strategies.  As above, the patient  acknowledges understanding of the importance of changing her infusion  set at least every 3 days and she will seek out resources to make sure  that she has adequate supplies.  She is to continue to monitor her CBGs  7 or 8 times daily.  Diabetic diet.  Activity as tolerated.  She is to  continue Depo-Provera, Imitrex 100 mg as needed for migraines, Flexeril  5 mg as needed for migraines, Allegra 180 mg daily.  She is to keep her  followup appointment with Veverly Fells. Altheimer, M.D. on March 03, 2008,  sooner as needed.  Additional followup with our diabetes nurse educator  in our office as above and as needed.      Veverly Fells. Altheimer, M.D.  Electronically Signed     MDA/MEDQ  D:  12/24/2007  T:  12/24/2007  Job:  [id number]   cc:   Dois Davenport A. Rivard, M.D.

## 2011-01-31 NOTE — H&P (Signed)
[name], [name]             ACCOUNT NO.:  [id number]   MEDICAL RECORD NO.:  [id number]          PATIENT TYPE:  INP   LOCATION:  5125                         FACILITY:  [facility]   PHYSICIAN:  Alfonse Alpers. Gegick, M.D.DATE OF BIRTH:  1981/08/19   DATE OF ADMISSION:  12/22/2007  DATE OF DISCHARGE:  12/03/2007                              HISTORY & PHYSICAL   CHIEF COMPLAINT:  This is a 30 year old woman who presents with a  history of hyperglycemia and nausea.   HISTORY OF PRESENT ILLNESS:  She has had diabetes mellitus type 1 since  January 2006.  Her control has never been quite satisfactory.  She has  had A1cs which are frequent moderately elevated in the 8 and 9 range.  She has been using an insulin pump and has had a relatively large dose  per basal; she uses between 0.9 units per hour to 1.5 units per hour.  She denies any recent severe hypoglycemia.  The day prior to this  admission, she noted that her insulin infusion set was not functioning  properly, in that the needle was bent.  She did not have any alarms at  that time and continued using the device.  Subsequently, she became  nauseated and had 1 episode of emesis prior to this admission and she  has stated that she feels bad.  The 1 episode of vomiting occurred  with her lunch today; she did not eat any supper nor did she have a  breakfast this morning.  She put on her insulin pump again and had  warnings that the pump was not delivering.  She presents now with this  history.   She has a history of hypertension for which she is taking labetalol and  she has a history of depression; she is taking Effexor for this.   PAST MEDICAL HISTORY:  1. Diagnosis of diabetes was made in January 2006.  2. She has had a history of a Chiari malformation requiring surgery.   SOCIAL HISTORY:  She does not smoke or drink excessive amounts of  alcohol.   ALLERGIES:  There is no history of allergies other than POSSIBLE  SENSITIVITY TO  CODEINE.   REVIEW OF SYSTEMS:  The patient is somewhat confused right now and does  not remember any weight loss.  CARDIOVASCULAR/RESPIRATORY:  No tachypnea  noted.  GI:  See above.  GU:  No complaints.   PHYSICAL EXAM:  GENERAL:  This a well-developed woman who appears in no  acute distress.  Her tongue is dry.  VITAL SIGNS:  Her pulse rate is approximately 80.  SKIN:  Normal.  LUNGS:  Clear.  CARDIOVASCULAR:  Rhythm is regular.  The rate is slightly elevated, as  noted above.  ABDOMEN:  Soft.  No masses or tenderness is present.  EXTREMITIES:  Normal.  NEUROMUSCULAR:  No focal neurological deficits.   IMPRESSION:  1. Diabetes mellitus with diabetic ketoacidosis.  2. Hypertension.  3. History of depression.           ______________________________  Alfonse Alpers Dagoberto Ligas, M.D.     CGG/MEDQ  D:  12/22/2007  T:  12/23/2007  Job:  [id number]

## 2011-01-31 NOTE — Discharge Summary (Signed)
[name], [name] NO.:  [id number]   MEDICAL RECORD NO.:  [id number]          PATIENT TYPE:  INP   LOCATION:  3705                         FACILITY:  [facility]   PHYSICIAN:  Hollice Espy, M.D.DATE OF BIRTH:  1981/05/15   DATE OF ADMISSION:  03/07/2009  DATE OF DISCHARGE:  03/09/2009                               DISCHARGE SUMMARY   ATTENDING PHYSICIAN:  Hollice Espy, MD   PRIMARY CARE PHYSICIAN:  Tonita Cong, MD, Endocrinology.   DISCHARGE DIAGNOSES:  1. Diabetes mellitus type 1 with diabetic ketoacidosis, now resolved.  2. Secondary malfunction of the insulin pump line.  3. Hypokalemia secondary to electrolyte shifts from hypoglycemia, now      resolved.  4. Yeast urinary tract infection.   DISCHARGE MEDICATIONS:  The patient will continue on her insulin pump  regimen.  The settings are; basal rates are 0.9 units per hour from  midnight to 6 a.m.. 1.2 units per hour from 6 a.m. to 6 p.m., 1.2 units  per hour from 6 p.m. to midnight and 1 unit for every 4 grams of carbs  with the correction fraction of 1 unit per 25 mg with the target CBG at  100.   HOSPITAL COURSE:  The patient is a 30 year old white female with past  medical history of diabetes mellitus type 1, on insulin pump.  She has  noted some previous problems requiring a change in her insulin pump, but  on the day of admission had woken up and was getting air messages in her  insulin pump.  She was checking her CBGs and found to be markedly  elevated.  She tried numerous doses of subcu NovoLog, but her sugars  started to get away from her.  She then came in to the emergency room,  she was found to be in DKA.  Her acidosis was mild with an anion gap of  15.  Her blood sugars ranging in the 400s-500s.  She was started on  insulin Glucommander.  She quickly reversed the acidosis and attempts  were made to get her off the Glucommander, however, even with the Lantus  her sugar started to  go back up, although the acidosis did not resume.  She remained on the Glucommander until the following day, June 21.  At  that time, diabetes education worked with the patient and talked to the  [employer].  They were able to ascertain that her pump appeared  to be functioning, but the line port from the pump to the patient  appeared to be malfunctioning.  [employer] then overnighted some new  lines to the patient's home and the patient's mother brought them in.  As of June 22, the patient's lines were placed, they appeared to be  functioning, there are no air messages right now.  The patient will try  to eat lunch, receive a bolus and if her CBGs are stable then she will  be discharged to home.  I spoke with her endocrinologist, Dr. Talmage Coin, who will see the patient either on Wednesday the 23rd or the  Thursday the 24th.  The patient will call for appointment.  Her other  issues are stable and evaluation on admission found that she had a mild  yeast UTI.  She has completed 2 days of Diflucan.  She will be  discharged home with 5 more days of Diflucan.  She is otherwise, stable.   DISPOSITION:  Improved.   ACTIVITIES:  Slow to increase.   DISCHARGE DIET:  Carb modified diet.  She is being discharged to home.      Hollice Espy, M.D.  Electronically Signed     SKK/MEDQ  D:  03/09/2009  T:  03/10/2009  Job:  [id number]   cc:   Tonita Cong, M.D.

## 2011-02-03 NOTE — H&P (Signed)
[name], [name]             ACCOUNT NO.:  [id number]   MEDICAL RECORD NO.:  [id number]          PATIENT TYPE:  INP   LOCATION:  9158                          FACILITY:  [facility]   PHYSICIAN:  Osborn Coho, M.D.   DATE OF BIRTH:  August 31, 1981   DATE OF ADMISSION:  10/09/2006  DATE OF DISCHARGE:                              HISTORY & PHYSICAL   [name] is a 30 year old, gravida 1, para 0, at 33-6/7 weeks who  presents today for 23-hour admission secondary to elevated blood  pressure, type 1 diabetes on insulin pump, and #3 PIH.  Pregnancy has  been remarkable for  1. Type 1 diabetes with patient on insulin pump for approximately 2      years.  2. Migraines.  3. History of Arnold-Chiari malformation with type 1 decompression in      2000.  4. Pregnancy-induced hypertension with blood pressure beginning to be      elevated at approximately 32-33 weeks.  5. Depression on Lexapro.  6. Family history of pregnancy-induced hypertension.  7. History of pyelonephritis.   The patient was seen in maternity admissions on the evening of October 08, 2006, for evaluation of chest pressure and shortness of breath.  During that time, she also had been noted in the office to have elevated  blood pressure.  She had a PIH workup which showed negative labs;  however, her chest x-ray showed mild pulmonary congestion. She had a  normal EKG.  Her D-dimer lab test was positive at 1.16.  She, therefore,  had a spiral CT to rule out PE.  This was negative.  The patient then  was sent to see the cardiologist today.  She saw Dr. Jacinto Halim.  He felt  that he issues were related to possible preeclampsia.  He gave her a  prescription for Toprol; however, the patient began to feel worse  subsequent to that before taking the Toprol and was seen in our office.  At that time, her blood pressure was still elevated, and she was sent to  the hospital for 23-hour admission for further evaluation.   PRENATAL  LABORATORY DATA:  Blood type B positive, RH antibody negative,  VDRL nonreactive, rubella titer positive, hepatitis B surface antigen  negative, HIV nonreactive, cystic fibrosis testing negative. Hemoglobin  A1c in August was 9.3.  Hemoglobin upon entry to practice was 14.6.  It  was 13.7 on January 21.  Pap was normal in April. GC and chlamydia  cultures were negative in August.  Quadruple screen was done and was  normal.  EDC of November 28, 2006, was established by first trimester  ultrasound.   HISTORY OF PRESENT PREGNANCY:  The patient entered care at approximately  11 weeks.  She was followed in her pregnancy for diabetes by Dr.  Leslie Dales.  She had an insulin pump May 2007.  She had a 24-hour urine  done early in her pregnancy that was normal.  The patient had an  ultrasound at her first visit showing normal development.  Plans were  made by Dr. Estanislado Pandy, who was her primary, for hemoglobin A1c every  trimester, a level 2 ultrasound at 18 weeks, a fetal echocardiogram at  18-20 weeks, nonstress test two times weekly at 32 weeks, and a growth  ultrasound at 28, 32, 34, 36, and 38 weeks.  She was to have a second  trimester AFP. At 14 weeks, her blood sugars were still showing some  hypoglycemic episodes. She did have some constipation.  She did receive  a flu shot.  Hemoglobin A1c was 6.3 at 17 weeks.  She had an ultrasound  at that time showing normal growth.  No anomalies were seen Quadruple  screen was done at that time and was normal.  She was having some breast  pain at 18 weeks.  Was evaluated and found to having no issues.  At 20  weeks, her fasting blood sugars were averaging 90-100.  Two-hour  averages were 150.  There were occasional 200s.  She was on Lexapro for  depression.  She was kicked in the abdomen above the umbilicus by a 1-  year-old at the day care at 20 weeks.  No significant findings were  noted, and the fetus was noted to have stable heart rate.  The basal  rate  and boluses were readjusted on her insulin pump at 22 weeks by Dr.  Leslie Dales.  She was having no issues with hypoglycemia.  She did have a  positive ANA titer noted in 2000.  Repeat ANA, LAC, ACLA were done  during this pregnancy. She had an echocardiogram of the fetus that was  normal.  All the antibody testing was negative.  She had another  ultrasound at 24 weeks showing estimated fetal weight within normal  limits, normal cervix, and normal fluid.  At 26 weeks, there was more  adjustment of her insulin pump.  She was having some difficulty  controlling her CBGs secondary to work scheduling.  She reduced to half  time at 25 weeks.  She had another ultrasound at 28 weeks showing growth  in the 91st percentile and fluid volume of 97th percentile.  She was  seen in maternity admissions unit on December 15 and required Procardia.  Cervix was within normal limits.  She had another ultrasound at 31 weeks  secondary to decreased fetal movement.  Her biophysical profile was 8/8.  Estimated fetal weight was at 74th percentile with normal fluid.  NSTs  began biweekly at 32 weeks.  Her CBGs began to improve on the pump at  that time.  She was having some right hip and thigh pain.  She was  placed on Vicodin and was referred to [facility].  On January 21, she was seen by Dr. Estanislado Pandy complaining of some chest  pain, chest pressure, with shortness of breath.  Heart rate was 100.  Respiratory rate was 24.  Blood pressure was 140/95.  She was placed out  of work at that time, was sent to maternity admissions unit for further  evaluation.  At that time, her blood pressure was in the 120-130s/low  80s to very occasional 90 to 92 diastolic.  PIH labs were within normal  limits.  Urine had been negative for protein that day.  She had a chest  x-ray that showed mild pulmonary congestion.  She also had a EKG that  was within normal limits.  Her D-dimer was done after consult by Dr. Jacinto Halim  from cardiology, and the D-dimer was positive.  A spiral CT was  done that was normal.  She was then scheduled to  see the cardiologist  today. She did see him today, and he put her on Toprol and felt her  issues were related to possible preeclampsia developing.  Plan was made  then for the patient to begin a 24-hour urine today.   OBSTETRICAL HISTORY:  The patient is primigravida.   PAST MEDICAL HISTORY:  She is a previous birth control user, Ortho-  Novum.  She had one abnormal Pap in the past with a colposcopy.  Paps  have been normal since.  She is a type 1 diabetic diagnosed 2 years ago.  She was placed on insulin pump in May 2007.  She has had several bladder  infections and a kidney infection in 2007.  She does have a history of  depression and has been on Lexapro.  The patient has also had scarlet  fever in the past.   PAST SURGICAL HISTORY:  Includes wisdom teeth in 1997.  Her tonsils were  removed in 1995.  She had decompression of an Arnold-Chiari malformation  in 2000.   She has been hospitalized several times for diabetes. She also has a  history of migraines.   ALLERGIES:  The patient is sensitive to CODEINE which causes nausea and  worsening of headache.   FAMILY HISTORY:  Maternal grandfather had heart disease . Maternal  grandfather also had thrombophlebitis.  Maternal grandmother had  emphysema.  Maternal grandmother and maternal grandfather, maternal  aunt, maternal uncle are all type 2 diabetic.  Her mother has  hypothyroidism.  Her maternal grandmother has non-Hodgkin's lymphoma.  Her mother, maternal aunt, and maternal first cousin have migraines.   GENETIC HISTORY:  Unremarkable except for patient having type 1  diabetes.   SOCIAL HISTORY:  The patient is single.  Father of the baby's name is  Sunday Shams.  He has been involved during this pregnancy.  The patient  is currently accompanied by her mother who is also involved and  supportive.  The patient is  Caucasian, of [place] faith.  She is  college educated.  She is a day care teacher.  Her partner has a high  school education.  He is employed in Therapist, music care.  She has been followed  by the physician service at [facility].  She denies any  alcohol, drug, or tobacco use during this pregnancy.   PHYSICAL EXAMINATION:  VITAL SIGNS: Blood pressure 134/92, pulse 100,  respiratory rate 20.  Fetal heart rate is 150.  CHEST:  Clear.  HEENT: Within normal limits.  LUNGS:  Breath sounds are clear.  HEART:  Regular rate and rhythm without murmur.  BREASTS: Soft and nontender.  ABDOMEN: Fundal height is approximately 34 cm.  Fetus is vertex by  ultrasound done on January 21.  PELVIC:  Exam is deferred.  EXTREMITIES:  Deep tendon reflexes 2-3+ with 1 beat of clonus  bilaterally, and there is 1-2+ edema noted in lower extremities.   PIH labs area pending at this time.  Blood sugar testing is also pending  at this time.   IMPRESSION: 1. Intrauterine pregnancy at 33-6/7 weeks.  2. Pregnancy-inducted hypertension, rule out preeclampsia.  3. Type 1 diabetes on insulin pump.  4. Depression on Lexapro.  5. History of Arnold-Chiari malformation repair in 2000.   PLAN:  1. Admit to [facility] antenatal unit for consult      with Dr. Osborn Coho as attending physician for 23-hour      observation.  2. Continue electronic fetal monitoring.  3.  PIH labs will be done.  4. A 24-hour urine will be begun.  5. Complete OB ultrasound will be done to evaluate fetal growth and      fluid.  6. Dr.  Su Hilt will determine further plan of care.      Renaldo Reel Emilee Hero, C.N.M.      Osborn Coho, M.D.  Electronically Signed    VLL/MEDQ  D:  10/09/2006  T:  10/09/2006  Job:  [id number]

## 2011-02-03 NOTE — Discharge Summary (Signed)
[name], [name]             ACCOUNT NO.:  [id number]   MEDICAL RECORD NO.:  [id number]          PATIENT TYPE:  INP   LOCATION:  5505                         FACILITY:  [facility]   PHYSICIAN:  Marcene Duos, M.D.DATE OF BIRTH:  1981/04/06   DATE OF ADMISSION:  09/30/2004  DATE OF DISCHARGE:  10/05/2003                                 DISCHARGE SUMMARY   ADMITTING DIAGNOSES:  1.  New onset diabetes mellitus.  2.  Dehydration.  3.  Sinusitis.  4.  Depression.  5.  Allergies.   DISCHARGE DIAGNOSES:  1.  New onset probable insulin-dependent diabetes mellitus.  2.  Hyponatremia some of which is factitious secondary to high blood sugars,      resolving.  3.  Dyslipidemia.  4.  Hypertriglyceridemia.   HISTORY OF PRESENT ILLNESS:  [name] is a 30 year old female with one and  a half to two weeks' history of polyuria, polydipsia and a 10 pound weight  loss in recent weeks who had been seen in the office the day prior to  admission with complaints of dizziness, blurred vision, general malaise for  two days.  Blood glucose was found to be 400.  They had noted at home that  it was above 200 on a family member's Glucometer.  Hemoglobin A1C in the  office was 9.3%.  C peptide low normal at 1.3.  Bicarbonate 29.  Patient was  placed on Glucotrol 5 mg and a low carbohydrate diet and told to orally  rehydrate with water only.  Unfortunately, she had a fair amount of  carbohydrates the rest of the day and went to work the following morning.  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  Denied nausea, vomiting, diarrhea, or abdominal pain and she had  been keeping her CBGs and they were running in the 300-400 range.  Patient  had been on Augmentin for sinusitis and had several more days left.  Patient's examination was unremarkable other than appearing a bit dehydrated  and weight down 1.5 pounds from the day previous to 150.5 pounds.  She was  admitted, placed on IV saline, and began insulin sliding scale and increased  dose of Glucotrol with the uncertainty as whether this was truly insulin-  dependent versus non-insulin-dependent.   HOSPITAL COURSE:  #1 - NEW ONSET DIABETES MELLITUS:  Patient's sugars have  come into the 200 range fairly regularly and sometimes high 100 range with  sliding scale NovoLog.  She has been on Glucotrol 10 mg in the morning, 5 mg  in the p.m. and she has been rehydrated.  Has received diabetic teaching  with nutrition and injections of her insulin, but not with regards to  insulin dosing per carbohydrate grams until day prior of discharge and  hopefully we will see that her sugars continue to improve that way.  The  plan is at discharge for her to follow up with me in two days.  Will  consider discontinuing her Glucotrol and adding Lantus at that time and will  also see if we cannot get her in  with Dr. Talmage Nap, endocrinology, soon to  discuss the possibility of an insulin pump down the road and also get some  more diabetic teaching as an outpatient.  Patient is now comfortable  injecting herself and is on q.a.c. and h.s. Accu-Cheks.  We will begin  dosing 1 unit per 15 g of carbohydrate with meals and snacks.   #2 - DEHYDRATION:  Patient rehydrated and doing much better now.  Dizziness  has resolved as has her visual disturbance.   #3 - SINUSITIS:  Completed Augmentin two days prior to discharge and that  issue is resolved at this point.   #4 - HYPONATREMIA:  This is felt to be somewhat factitious secondary to her  high blood sugars and this is correcting at this point.  Patient thus  discharged in improved condition, stable, to follow up with me in two days.   DISCHARGE MEDICATIONS:  1.  NovoLog insulin pen, insulin sensitive sliding scale and to add 1 unit      per 15 g of carbohydrates to dose before meals including snacks.  2.  Glucotrol 10 mg p.o. q.a.m., 5 mg p.o. q.p.m.  3.  Lexapro 10 mg  p.o. daily.  4.  Allegra 180 mg p.o. daily.   DISCHARGE INSTRUCTIONS:  1400 calorie carbohydrate modified diet.  To not  work this week and to follow up with me in two days, October 06, 2004,  Thursday.  To call with any problems with her sugars or nausea, vomiting.  Patient was also given a Glucagon pen for low blood sugars where she is  affected to the point where she cannot take anything p.o.       EMM/MEDQ  D:  10/04/2004  T:  10/04/2004  Job:  [id number]

## 2011-02-03 NOTE — Discharge Summary (Signed)
[name], [name]             ACCOUNT NO.:  [id number]   MEDICAL RECORD NO.:  [id number]          PATIENT TYPE:  INP   LOCATION:  9119                          [facility]ACILITY[facility]   PHYSICIAN:  Osborn Coho, M.D.   DATE OF BIRTH:  1981-05-11   DATE OF ADMISSION:  11/08/2006  DATE OF DISCHARGE:                               DISCHARGE SUMMARY   ADMITTING DIAGNOSES:  1. Intrauterine pregnancy at 37 and three-sevenths weeks.  2. Early labor.  3. Insulin-dependent diabetes on insulin pump.  4. Pregnancy-induced hypertension on labetalol.  5. Positive group B streptococcus.   PREOPERATIVE DIAGNOSIS:  Nonreassuring fetal heart rate tracing.   PROCEDURE:  Primary low transverse cesarean section.   POSTOPERATIVE DIAGNOSES:  1. Intrauterine pregnancy at 37 and three-sevenths weeks, delivered.  2. Diabetes.  3. Pregnancy-induced hypertension.  4. Nonreassuring fetal heart rate tracing with nuchal cord.  5. Primary low-transverse cesarean section.   [name] is a 30 year old gravida 1, para 0 who presented at 15 and  three-sevenths weeks in early labor.  Her labor progressed to 4 cm when  fetal heart rate became nonreassuring.  At that point options were  discussed with the patient by Dr. Marline Backbone and decision was made  to proceed with primary low transverse cesarean section.  This was  accomplished on November 09, 2006, at 4:24 a.m.  The patient gave birth  to a 64-gauge female infant named [name] with Apgar scores of 2, 4 and  7, the cord pH 6.96.  Infant went to the NICU and has been stable in the  NICU since that time.  The patient has done fair in the postoperative  period.  She has had some issues with poor control with her insulin pump  and diabetes.  She has since been seen by Dr. Leslie Dales and Dr. Lucianne Muss  and has had consult with diabetic coordinator.  The patient's blood  pressure has been managed with labetalol.  She has had some anxiety and  depression related baby  in the NICU and has stayed an extra day due to  diabetes instruction and blood sugar management and blood pressure  evaluation with baby in the NICU.  On this her fourth postoperative day  she is judged to be in satisfactory condition for discharge.   DISCHARGE INSTRUCTIONS:  Per [facility] handout.   DISCHARGE MEDICATIONS:  1. Motrin 600 mg p.o. q.6h. p.r.n. pain.  2. Tylox one to two p.o. q.3-4h. p.r.n. pain.  3. Labetalol 100 mg p.o. b.i.d.  4. Prenatal vitamins.   The patient will follow up with Dr. Lucianne Muss and Dr. Leslie Dales for her  diabetes, and will follow up at the office of [facility] on Friday, February  29 at 10:30 a.m. with Dr. Estanislado Pandy.  She will call for any problems or  concerns.      Rica Koyanagi, C.N.M.      Osborn Coho, M.D.  Electronically Signed    SDM/MEDQ  D:  11/13/2006  T:  11/13/2006  Job:  [id number]

## 2011-02-03 NOTE — Discharge Summary (Signed)
[name], [name]             ACCOUNT NO.:  [id number]   MEDICAL RECORD NO.:  [id number]          PATIENT TYPE:  INP   LOCATION:  9158                          [facility]AC[facility]   PHYSICIAN:  Osborn Coho, M.D.   DATE OF BIRTH:  [birth year]   DATE OF ADMISSION:  10/09/2006  DATE OF DISCHARGE:  10/12/2006                               DISCHARGE SUMMARY   ADMISSION DIAGNOSES:  1. Intrauterine pregnancy at 33-6/7 weeks.  2. Pregnancy-induced hypertension, rule out preeclampsia.  3. Type 1 diabetes on insulin pump.  4. Depression on Lexapro.  5. History of Arnold-Chiari malformation repair in 2000.   DISCHARGE DIAGNOSES:  1. Intrauterine pregnancy at 33-6/7 weeks.  2. Newly diagnosed with pregnancy-induced hypertension without      evidence of preeclampsia.  3. Type 1 diabetes on insulin pump.  4. Depression on Lexapro.  5. History of Arnold-Chiari malformation repair in 2000.   CONDITION ON DISCHARGE:  Good.   HOSPITAL PROCEDURES:  1. Electronic fetal monitoring.  2. A 24-hour urine.  3. Doppler studies, left lower extremity.  4. Diabetes mellitus management.  5. Ultrasound.   HOSPITAL COURSE:  The patient was admitted for hypertension, 23-hour  observation and 24-hour urine with PIH labs and complete fetal  monitoring. She had a complete OB ultrasound to evaluate growth and  fluid. She had continuous monitoring of the baby which continued to be  reassuring throughout her stay. On October 10, 2006, she was feeling  better. She was still having some visual changes and did report  shortness of breath and chest tightness but reported that it was better.  Blood sugars ranged from 76 to 122, and she managed her sugars with an  insulin pump. Chest x-ray was done and was normal. She has left lower  leg extremity Dopplers done. Biophysical profile done showed a score of  6/8. The Doppler studies were normal. On October 11, 2006, she was doing  well. Did have a headache but no  visual changes. She expressed anxiety  about going home. A 24-hour urine showed a protein level of 252. Blood  pressures ranged 125 to 127 over 72 to 81. On October 11, 2006, she  continued to complain of shortness of breath and chest tightness but  that they have improved. Blood pressures were stable. On October 12, 2006, she was continuing to complain of anxiety about going home and not  wanting to be alone. She complained of dizziness spells and shortness of  breath and chest pressure. She did report that these were unchanged from  before but that they were persistent. Blood sugars were 130 to 133 over  84 to 96. Blood sugars were 54 to 151. Fetal monitor showed a reactive  fetal heart tracing. Weight showed evidence of diuresis with a decrease  from 180 down to 175. She was seen by Dr. Estanislado Pandy to discuss discharge.  Dr. Estanislado Pandy felt like her blood pressure was well controlled with  labetalol 100 mg p.o. b.i.d., NST was reactive and that her diabetes was  well controlled with insulin pump. The patient agreed to go home with  set up of advanced home health visits on a daily basis and close follow  up in the office, so she was deemed to have received full benefit of her  stay and was discharged home.   DISCHARGE MEDICATIONS:  1. Labetalol 100 mg p.o. b.i.d.  2. Prenatal vitamins 1 p.o. daily.   DISCHARGE LABORATORY DATA:  White blood cell count 12.9, hemoglobin  13.4, platelets 203. Chemistries all within normal limits with an AST of  17, an ALT of 17. Uric acid 4.2.   DISCHARGE INSTRUCTIONS:  Include bedrest and monitoring of blood sugars  at home.   DISCHARGE FOLLOWUP:  Will be advanced home health visits on January 28,  30, 31 for blood pressure, weight, urine dipstick and NST. She has an  appointment in the office on October 16, 2006 with a repeat 24-hour  urine and another office visit on October 19, 2006.   CONDITION ON DISCHARGE:  Good, and she was discharged home.       [name], C.N.M.      Osborn Coho, M.D.  Electronically Signed    MLW/MEDQ  D:  12/15/2006  T:  12/15/2006  Job:  [id number]

## 2011-02-03 NOTE — Consult Note (Signed)
[name], [name]             ACCOUNT NO.:  [id number]   MEDICAL RECORD NO.:  [id number]          PATIENT TYPE:  INP   LOCATION:  9119                          FACILITY[facility]   PHYSICIAN:  Reather Littler, M.D.       DATE OF BIRTH:  12-31-1980   DATE OF CONSULTATION:  11/09/2006  DATE OF DISCHARGE:                                 CONSULTATION   REASON FOR CONSULTATION:  Diabetes management.   HISTORY:  This is a 30 year old patient with type 1 diabetes who just  delivered last night.  The patient apparently had been poorly controlled  prior to her pregnancy with an A1c of 9.3% last August.  However, with  the help of an insulin pump which was started during her pregnancy, her  A1C had improved down to 6.6 last November.   The patient was last seen in the office in late December when her blood  sugars appeared to be fairly close to target.  The patient has a basal  rate currently of 0.9 from midnight to 4 a.m., 1.25 from 4 to 7 a.m.  and 1.4 from 7 to 3:30 p.m. and also 1.3 from 3:30 to 7, 1.1 units from  7 p.m. to 10 p.m. and 1 unit from 10 p.m. until midnight.  Her carb  coverage is 1 for 3 gram and her sensitivity is 1 for 25 with target of  75-90.   Today her blood sugars after delivery have been nearly normal with only  one reading of 68 at 6 a.m. but subsequently they were gradually  increasing up to 144.  In view of the patient's blood loss history,  shows that she took 8.8 units for the high blood sugar but not clear of  what carbohydrates she had and subsequently she also gave herself 10  units for snack, presumably 30 g.  Subsequently, her blood sugar fell  down to 52 with symptoms of lightheadedness.   The patient currently somewhat drowsy because of getting pain  medications and is not able to clearly follow directions.   PAST MEDICAL HISTORY:  1. History of dyslipidemia.  2. Migraines.  3. Depression/anxiety.  4. History of Chiari malformation surgery.  5.  Allergic rhinitis.   MEDICATIONS:  She does not take any regular medications now except for  her insulin.   ALLERGIES:  NO KNOWN DRUG ALLERGIES.  She is sensitive to codeine.   PHYSICAL EXAMINATION:  Not done since the patient is postpartum and in  acute pain currently.   ASSESSMENT/RECOMMENDATIONS:  The patient's blood sugars are fairly well  controlled but she appears to be a little more sensitive to her insulin,  especially mealtime boluses.   Since we expect her insulin requirement to gradually reduce in the next  few hours, we will reduce her basal rate to 0.5, only bolus 1 for 10 g  of carbohydrates with a correction of 1 for 50.           ______________________________  Reather Littler, M.D.     AK/MEDQ  D:  11/09/2006  T:  11/09/2006  Job:  [id number]  cc:   Hal Morales, M.D.  Fax: [phone]   Veverly Fells. Altheimer, M.D.  Fax: [phone]

## 2011-02-03 NOTE — Discharge Summary (Signed)
[name], [name]             ACCOUNT NO.:  [id number]   MEDICAL RECORD NO.:  [id number]          PATIENT TYPE:  INP   LOCATION:  9149                          FACILITY:  [facility]   PHYSICIAN:  Crist Fat. Rivard, M.D. DATE OF BIRTH:  1981/02/27   DATE OF ADMISSION:  10/16/2006  DATE OF DISCHARGE:  10/17/2006                               DISCHARGE SUMMARY   ADMISSION DIAGNOSES:  1. Intrauterine pregnancy at 33-6/7 weeks.  2. Pregnancy induced hypertension, rule out preeclampsia.  3. Type 1 diabetes.   DISCHARGE DIAGNOSES:  1. Intrauterine pregnancy at [redacted] weeks gestation.  2. Pregnancy induced hypertension with no evidence of preeclampsia.  3. Type 1 diabetes, stable.   HISTORY OF PRESENT ILLNESS:  [name] is a 30 year old gravida 1, para  0 who presented at 33-6/7 weeks with increased PIH symptoms, a constant  headache times several days, seeing spots, and epigastric pain. Her  blood pressures on admission were 129/99, 130/63, 136/78 and her PIH  labs were normal. Her 24 hour urine results were pending on admission.  She was therefore admitted for further evaluation.   Her vital signs have remained stable and she is afebrile throughout her  admission. Her blood pressures have normalized and are not elevated. Her  baby's heart rate has been reactive and reassuring throughout admission.  She is contracting irregularly. Not having any vaginal bleeding or  leaking of fluid. This morning on October 17, 2006 her headache has  resolved. Her 24 hour urine results returned with 162 mg of protein per  24 hours. After discussion with Dr. Dois Davenport Rivard the decision was made  for the patient to be discharged home. Her PIH is well controlled with  no evidence of preeclampsia.   She will continue maintaining her blood sugars with her insulin pump,  and continue Labetalol 100 mg p.o. b.i.d. She has a Home Health visit  tomorrow on October 18, 2006 and will return to the office of [facility] on  Friday, October 19, 2006 for an appointment, and call for any further  problems or concerns.    Dictated by Rica Koyanagi, C.N.M.     Rica Koyanagi, C.N.M.      Crist Fat Rivard, M.D.  Electronically Signed   SDM/MEDQ  D:  10/17/2006  T:  10/17/2006  Job:  [id number]

## 2011-02-03 NOTE — H&P (Signed)
NAMEATHLEEN, FELTNER NO.:  [id number]   MEDICAL RECORD NO.:  [id number]          PATIENT TYPE:  INP   LOCATION:  1833                         FACILITY:  [facility]   PHYSICIAN:  Georgann Housekeeper, MD      DATE OF BIRTH:  1981/06/16   DATE OF ADMISSION:  11/22/2005  DATE OF DISCHARGE:                                HISTORY & PHYSICAL   PHYSICIANS:  Primary care physician: Marcene Duos, M.D.  Endocrinologist: Dorisann Frames, M.D.   CHIEF COMPLAINT:  Elevated sugars and lethargy.   HISTORY OF PRESENT ILLNESS:  A 30 year old female with type 1 diabetes  diagnosed about a year ago, presented with elevated blood sugars and  lethargy.  She was doing fine until this morning.  She says sugar was 110 in  the morning, and then later in the day she had a little bit of Rice Krispies  and skim milk, and the sugars went over 600 at home.  She started feeling  lethargy, some nausea, no vomiting, no abdominal pain.  She had recently  been in the ER on Saturday with elevated blood sugar, was sent home.  She  was on and off with fluctuating sugars.  Also had been treated with Diflucan  last week for 4 days for a yeast infection, and that cleared up.  She feels  under a lot of stress at school.  No fevers, no vomiting, no abdominal pain.  Some nausea.  No chest congestion or cold.   ALLERGIES:  CODEINE.   MEDICATIONS:  1.  Lantus 35 units daily.  2.  Birth control pill once a day.  3.  Imitrex 100 mg p.r.n.  4.  Flexeril 10 mg daily.  5.  Allegra 180 mg daily.  6.  Insulin sliding scale with NovoLog.   PAST MEDICAL HISTORY:  1.  Diabetes, type 1, on fluctuating blood sugar for the last 6 to 8 months,      under the care of Dr. Talmage Nap, endocrinologist.  2.  Allergies.  3.  Migraine headaches.   PAST SURGICAL HISTORY:  1.  __________ repair in 2000.  2.  T&A and 1996.   FAMILY HISTORY:  Noncontributory.   SOCIAL HISTORY:  No tobacco or alcohol.  She is in school.  Her  mom was  present at the time of exam.   PHYSICAL EXAMINATION:  VITAL SIGNS: Temperature 98.3, blood pressure 118/73,  pulse 66, respirations 18, O2 saturation 98%.  GENERAL: Awake and alert, no acute distress.  HEENT:  Pupils reactive.  Oral mucosa moist.  LUNGS: Clear.  CARDIAC: Regular S1 and S2 without murmurs.  EXTREMITIES: No edema.  ABDOMEN:  Soft, good bowel sounds.   LABORATORY DATA:  UA was negative.  Pregnancy test was negative.  Sodium  124, potassium 0.9, BUN 10, chloride 94, glucose over 700.  The pH was 7.36.  White count 4.3.   IMPRESSION:  A 30 year old female with type 1 diabetes, fluctuating blood  sugar with elevated blood sugars.  Hyperglycemia.  Rule out diabetic ketoacidosis.  Check ketones, IV fluids,  Glucomander, IV insulin.  I will keep  her n.p.o. with ice chips while on the  Glucomander.  Once the sugars are better, switch her to Lantus and NovoLog  sliding scale, and start a diabetic diet.  She will benefit from the pump  because of the fluctuating blood sugars.  We will check her blood  chemistries and electrolytes.      Georgann Housekeeper, MD  Electronically Signed     KH/MEDQ  D:  11/22/2005  T:  11/22/2005  Job:  [id number]

## 2011-02-03 NOTE — Op Note (Signed)
NAMEMEYA, CLUTTER             ACCOUNT NO.:  [id number]   MEDICAL RECORD NO.:  [id number]          PATIENT TYPE:  INP   LOCATION:  9119                          FACILITY:  [facility]   PHYSICIAN:  Janine Limbo, M.D.DATE OF BIRTH:  05-12-1981   DATE OF PROCEDURE:  11/09/2006  DATE OF DISCHARGE:                               OPERATIVE REPORT   PREOPERATIVE DIAGNOSES:  1. Thirty-seven and 3/7 weeks' gestation.  2. Insulin-dependent diabetes mellitus.  3. Hypertension.  4. Nonreassuring fetal heart rate tracing.   POSTOPERATIVE DIAGNOSES:  1. Thirty-seven and 3/7 weeks' gestation.  2. Insulin-dependent diabetes mellitus.  3. Hypertension.  4. Nonreassuring fetal heart rate tracing.  5. Nuchal cord.   PROCEDURE:  Low transverse cesarean section.   SURGEON:  Leonard Schwartz, MD   FIRST ASSISTANT:  Nigel Bridgeman, Certified Nurse Midwife.   ANESTHETIC:  General (inadequate epidural).   DISPOSITION:  [name] is a 30 year old female, gravida 1, para 0, who  has been followed at the [facility].  The patient's pregnancy has been complicated by  insulin-dependent diabetes mellitus.  She has had an insulin pump and  her blood sugars have only been moderately well-controlled.  The patient  also has a history of hypertension and she takes labetalol 100 mg twice  each day.  The patient has an Arnold-Chiari malformation and she had  surgery in 2000.  She suffers from depression and she takes Lexapro.  The patient also had a positive beta strep culture; she did receive  penicillin during her labor.  The patient presented this morning with  complaints of uterine contractions.  She spontaneously ruptured her  membranes.  The patient progressed in her labor and she was noted to be  4-cm dilated at 3:10 a.m.  The patient also was noted to have recurrent  decelerations.  An intrauterine pressure catheter and a fetal scalp  electrode were placed.  An amnioinfusion was begun.  At that point, we  did discuss cesarean section.  We discussed the associated risks which  included, but were not limited to, anesthetic complications, bleeding,  infection, and possible damage to the surrounding organs.  Her blood  pressure was noted to be 111/69.  The patient had previously received an  epidural, but her discomfort was not well-controlled.  The patient had a  Foley catheter placed on Labor and Delivery.  The fetal heart rate  decelerations continued in spite of the amnioinfusion.  The patient was  given oxygen.  She was turned from her left side to her right side.  The  cervix was checked and was noted to be unchanged.  The fetal heart rate  decelerations did not resolve with scalp stimulation.  A cord prolapse  was not detected.  The decision was made to proceed with a STAT cesarean  section.   PROCEDURE:  The patient was taken to the operating room on an emergent  basis.  The patient's abdomen was prepped with multiple layers of  Betadine and then sterilely draped.  At 3:23 a.m., we were noted to be  in the operating room and it was determined that the epidural would not  be adequate for cesarean delivery.  At 3:30 a.m., general anesthesia was  induced.  An incision was made in the patient's abdomen at 3:31 a.m.  The incision was extended sharply through the subcutaneous tissue,  fascia, and the anterior peritoneum.  The bladder flap was developed and  an incision was made in the lower uterine segment.  The incision was  extended in a low transverse fashion.  The fetal head was delivered with  the assistance of a vacuum extractor.  A nuchal cord was noted.  The  nuchal cord was reduced and the remainder of the infant was delivered.  The delivery time was noted to be 3:33 a.m..  The cord was clamped and  cut and the infant was handed to the waiting pediatric team with Dr.  Marthann Schiller in attendance.  Routine cord  blood studies were obtained.  The placenta was removed.  The uterine cavity was cleaned of amniotic  fluid, clotted blood, and membranes.  The patient was given IV  antibiotics.  The uterine incision was then closed using a running  locking suture of 2-0 Vicryl followed by an imbricating suture of 2-0  Vicryl.  Hematuria was noted and therefore we elected to instill sterile  milk into the bladder.  There was no leakage of milk from the bladder  and the bladder edge was noted to be 1-1/2 cm from our incision line.  Hemostasis was noted to be adequate.  The pelvis was vigorously  irrigated.  The fallopian tubes and ovaries were noted to be normal.  The anterior peritoneum and abdominal musculature were reapproximated in  the midline using 2-0 Vicryl.  The abdominal musculature and the fascia  were irrigated.  Hemostasis was adequate.  The fascia was closed using a  running suture of 0 Vicryl followed by 3 interrupted sutures of 0  Vicryl.  A size 7 Jackson-Pratt drain was placed in the subcutaneous  layer and brought out through the left lower quadrant; it was sutured  into place using 3-0 silk.  The subcutaneous layer was irrigated and  hemostasis was noted to be adequate.  The subcutaneous layer was closed  using a running suture of 0 Vicryl.  The skin was reapproximated using a  subcuticular suture of 3-0 Monocryl.  A pressure dressing was applied.  Sponge, needle, and instrument counts were noted to be correct.  The  estimated blood loss for the procedure was 700 mL.  The patient was  awakened from her anesthetic and taken to the recovery room in stable  condition.  The placenta was sent to Pathology.  The infant was sent to  the neonatal intensive care unit for observation.   FINDINGS:  A 2970-g female was delivered Jonna Clark).  The Apgars were 2 at  1 minute, 4 at 2 minutes and 7 at 5 minutes.  A nuchal cord was noted. The arterial cord blood pH was 6.96.  The patient was noted to have  a  small amount of hematuria, but when the bladder was tested with sterile  milk, the bladder was noted to be intact and there was no leakage of  sterile milk from the bladder.  The edge of the bladder was noted to be  away from our suture line.  The uterus, fallopian tubes, and the ovaries  appeared normal for the gravid state.      Janine Limbo, M.D.  Electronically Signed  AVS/MEDQ  D:  11/09/2006  T:  11/09/2006  Job:  [id number]

## 2011-02-03 NOTE — H&P (Signed)
[name], [name]             ACCOUNT NO.:  [id number]   MEDICAL RECORD NO.:  [id number]          PATIENT TYPE:  INP   LOCATION:  9119                          [facility]ACILITY[facility]   PHYSICIAN:  Janine Limbo, M.D.DATE OF BIRTH:  08-08-1981   DATE OF ADMISSION:  11/08/2006  DATE OF DISCHARGE:                              HISTORY & PHYSICAL   HISTORY OF PRESENT ILLNESS:  [name] is a 30 year old gravida 1, para  0 at 37-3/7 weeks, who presented complaining of uterine contractions  every 3 minutes for several hours.  She reports a small amount of bloody  show, positive fetal movement, denies headache, but has had some visual  spots, no epigastric pain.  She also had spontaneous rupture of  membranes while in Maternity Admissions Unit at approximately 12:55 a.m.  with clear fluid noted.  Pregnancy has been remarkable for:  (1) Insulin-  dependent diabetes mellitus with insulin pump, (2) PIH, with patient on  labetalol 100 mg b.i.d., (3) history of Arnold-Chiari malformation with  surgery in 2000, (4) depression, on Lexapro, (5) migraines, (6) history  of pyelonephritis, (7) positive Group B strep.  She had a recent 24-hour  urine that showed 160 mg of protein in a 24-hour specimen on February  19.   PRENATAL LABORATORY DATA:  Blood type is B positive, Rh-antibody  negative.  VDRL nonreactive.  Rubella titer positive.  Hepatitis B  surface antigen negative.  HIV is nonreactive.  Cystic fibrosis testing  was negative.  GC and Chlamydia cultures were negative in the 1st and  3rd trimester.  Pap was normal in April.  Hemoglobin A1c in August was  9.3.  Hemoglobin upon entry into practice was 14.6; it was within normal  limits on her last CBC, which was this past week.  Quadruple screen was  normal.  The patient did have ANA, LAC and ACLA titers done at 24 weeks  which were negative.  She had a 24-hour urine done most recently on  February 19 that showed 160 mg of protein in a  24-hour specimen.  Previous to that, she had had one done on October 25, 2005; this showed  188 mg of protein and one prior on January 29 that showed 162 mg of  protein.  Group B strep culture was positive at 36 weeks.   HISTORY OF PRESENT PREGNANCY:  The patient entered care at approximately  11 weeks.  She was followed by Dr. Leslie Dales for her diabetes management  and Dr. Lovell Sheehan for her Arnold-Chiari malformation repair.  She was  placed on Lexapro in the 1st trimester.  She had a 24-hour urine done at  her very first visit.  Hemoglobin A1c was done in August and was 9.3.  She had another ultrasound at 17 weeks showing normal growth; no  anomalies were seen.  Hemoglobin A1c was 6.3 at 19 weeks and blood  sugars were within normal limits.  She had leaking of breasts at 18  weeks with no abnormal findings noted.  By 20 weeks, her fasting blood  sugars were averaging 90-100 and her 2-hour p.c.'s were in the  150s with  an occasional 200.  Depression was well-controlled on Lexapro.  Fetal  echo was done and was negative.  She was kicked in the abdomen at 20  weeks by a child, but had no negative outcome.  By 20 weeks, her basal  rate and boluses were adjusted.  She had had a history of a positive ANA  titer in 2000; these were repeated and were negative.  She had another  ultrasound at 24 weeks showing normal growth.  At 28 weeks, she had  another ultrasound showing growth at the 91st percentile and fluid at  the 97th percentile.  She was seen in Maternity Admissions Unit around  that time and was placed on Procardia, but her cervix was normal.  She  had another ultrasound at 31 weeks with a biophysical profile secondary  to decreased fetal movement.  Growth at that time was at the 71st  percentile and fluid was normal.  NSTs were begun 2 times each week at  32 weeks.  She did continue to have some variations of her blood sugar,  but the pump did allow this to be managed.  She was placed on  Vicodin at  32 weeks.  At 32 weeks, she had an elevated blood pressure and was sent  to Maternity Admissions Unit for workup.  Subsequent to that she, at 33-  34 weeks, was also admitted for 23-hour observation secondary to  headache, upper abdominal pain and nausea.  A 24-hour urine was begun  and was within normal limits.  She was sent to Maternity Admissions Unit  again for an NST visit on January 29 secondary to an elevated blood  pressure; at that time, she was placed on labetalol 100 mg p.o. b.i.d.  Group B strep culture, and GC and Chlamydia cultures were done and group  B strep was positive; other cultures were negative.  She was seen in the  office this past week and cervix was 2 cm.   OBSTETRICAL HISTORY:  The patient is a primigravida.   MEDICAL HISTORY:  1. She was on birth control pills in the past.  2. She had 1 abnormal Pap in the past and had a colposcopy; Paps have      been normal since then.  3. She has a history of scarlet fever in the past.  4. She is a type 1 diabetic and has been on an insulin pump for      several years.  5. She has had several bladder infections.  6. She had a kidney infection in 2007.  7. She does have depression and is on Lexapro.  8. She had been hospitalized in the past for diabetes.  9. She also has migraines.   SURGICAL HISTORY:  1. Wisdom teeth removed in 1997 and tonsils in 1995.  2. She had Arnold-Chiari malformation repaired in 2000.   ALLERGIES:  She is sensitive to CODEINE, which causes nausea and  migraines.   FAMILY HISTORY:  Her mother had toxemia during her pregnancy.  Maternal  grandfather had heart disease; maternal grandfather also had  thrombophlebitis.  Paternal grandmother had emphysema.  Maternal  grandmother, maternal grandfather, maternal aunt and paternal uncle are  all type 2 diabetics.  Mother has hypothyroidism.  Maternal grandmother  has non-Hodgkin's lymphoma.  Maternal grandmother and  maternal grandfather have had mini-strokes.  Several family members on both sides  are smokers.   GENETIC HISTORY:  Unremarkable.   SOCIAL HISTORY:  The patient is single.  The father of the baby is  involved and supportive; his name is Sunday Shams.  The patient is  college-educated; she is a Building surveyor.  Her partner has a high  school education and he is employed in Therapist, music care.  She is Caucasian and  of the [place] faith.  She has been followed by the physicians'  service at [facility].  She denies any alcohol, drug or tobacco  use during this pregnancy.   PHYSICAL EXAMINATION:  VITAL SIGNS:  Blood pressure is 129/87, pulse is  111, respirations are 20 and temperature is 98.5.  O2 SAT is 98%.  CBG  was just done by the patient and it was 126.  HEENT:  Within normal limits.  LUNGS:  Bilateral breath sounds are clear.  HEART:  Regular rate and rhythm without murmur.  BREASTS:  Soft and nontender.  ABDOMEN:  Fundal height is approximately 39 cm.  Estimated fetal weight  is 8 pounds.  Uterine contractions are every 3 minutes, 60 seconds in  duration, of moderate quality.  Fetal heart rate is nonreactive, but is  at present reassuring.  She has had some occasional late-type  decelerations, but no repetitive pattern.  PELVIC:  Cervix:  External os is 2 cm; internal os is 4 cm.  There  appears to be a band of tissue around the outside of the external os.  Cervix is 80%, vertex at a -1 station with bulging bag of water.  The  patient is noted to be leaking fluid with positive Nitrazine and clear  fluid noted.  EXTREMITIES:  Deep tendon reflexes are 2+ without clonus.  There is 1 to  2+ edema in the lower extremities.   IMPRESSION:  1. Intrauterine pregnancy at 37-3/7 weeks.  2. Early labor.  3. Insulin-dependent diabetes, on insulin pump.  4. Pregnancy-induced hypertension, on labetalol.  5. Positive group B streptococcus.   PLAN:  1. Admit to birthing suite per  consult with Dr. Marline Backbone as      attending physician.  2. Routine physician orders.  3. Check CBGs every hour, to notify the MD if greater than 130 or less      than 70.  4. Close observation of fetal heart rate status.  5. Group B strep prophylaxis with penicillin G per standard dosing.      Renaldo Reel Emilee Hero, C.N.M.      Janine Limbo, M.D.  Electronically Signed    VLL/MEDQ  D:  11/09/2006  T:  11/09/2006  Job:  [id number]

## 2011-02-03 NOTE — H&P (Signed)
NAMEJONIECE, SMOTHERMAN             ACCOUNT NO.:  [id number]   MEDICAL RECORD NO.:  [id number]          PATIENT TYPE:  INP   LOCATION:  9149                          FACILITY:  [facility]   PHYSICIAN:  Osborn Coho, M.D.   DATE OF BIRTH:  1981-06-09   DATE OF ADMISSION:  10/16/2006  DATE OF DISCHARGE:                              HISTORY & PHYSICAL   [name] is a 30 year old, gravida 1, para 0 at 33-6/7 weeks, EDD  November 28, 2006, who presents from the office of [facility] with continued PIH symptoms.  She reports a constant headache  for several days. She is not taking any medication for it and rates it  3/10. She is still seeing spots that are like fire flies and is having a  epigastric pain in her mid upper abdomen at the top of her uterus which  she rates a 5/10.  She reports increased swelling and weight gain of 6  pounds since yesterday on her home scale.  She reports positive fetal  movement.  No contractions or cramping.  No vaginal bleeding or rupture  of membranes.  No nausea or vomiting or elevated temperature, and no  chest pain or shortness of breath. She is having regular daily bowel  movements with no constipation or diarrhea.  Her pregnancy has been  followed by the MD service at [facility] and is  remarkable for  1. Type 1 diabetes.  2. 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  3. Depression on Lexapro.  4. Migraines.  5. History of pyelonephritis.   This patient entered prenatal care at the office of [facility] on May 14, 2006, at approximately [redacted] weeks gestation.  Per  Dr. Cloretta Ned notes, she is a class B diabetic on an insulin pump. Her  hemoglobin A1c on June 25, 2006, was 6.3.  She manages her blood  sugars well on her pump with her endocrinologist, Dr. Leslie Dales.   Since 32 weeks, she has been having PIH symptoms and has had frequent  evaluation in maternity admissions and  overnight at [facility].  Her [facility] labs have been normal thus far, and she completed a 24-hour  urine as of today.  She was begun last week on labetalol 100 mg p.o.  b.i.d. Her prenatal lab work on May 14, 2006, showed hemoglobin 14.6,  hematocrit 42.1, platelets 304,000.  Blood type and Rh B+, antibody  screen negative, VDRL nonreactive, rubella immune, hepatitis B surface  antigen negative, HIV nonreactive.  Pap within normal limits.  GC and  chlamydia negative.  CF testing negative.   Fetal echocardiogram at 22 weeks was normal. The patient had ANA, LAC,  and ACLA titers checked. These were all negative.   OB HISTORY:  The current pregnancy.   MEDICAL HISTORY:  Significant for:  1. Arnold Chiari malformation with surgery in the year 2000.  2. The patient has type 1 diabetes.  3. Is being treated for depression with Lexapro.  4. Migraines.   PAST SURGICAL HISTORY:  1. Wisdom teeth in 1997.  2. Tonsils in  1995.   FAMILY HISTORY:  Maternal grandfather heart disease, maternal  grandfather thrombophlebitis, paternal grandmother emphysema, maternal  grandmother and maternal grandfather diabetes.  The patient's mother  hypothyroidism, maternal grandmother non-Hodgkin's lymphoma, maternal  grandmother and maternal grandfather stroke.   GENETIC HISTORY:  Unremarkable.  There is no family history of familial  or chromosomal disorders, children that were born with birth defects, or  any that died in infancy.   ALLERGIES:  The patient has an allergy to CODEINE.   HABITS:  She denies the use of tobacco, alcohol or illicit drugs   SOCIAL HISTORY:  [name] is a single Caucasian female.  Father of the  baby, Sunday Shams, is supportive.  They are [place] in their faith.   REVIEW OF SYSTEMS:  Is as described above.  The patient presents from  the office with increased PIH symptoms for evaluation.   PHYSICAL EXAMINATION:  VITAL SIGNS: Afebrile.  Blood pressure 129/99,   130/63 and 136/78.  HEENT: Unremarkable.  HEART:  Regular rate and rhythm.  LUNGS:  Clear.  ABDOMEN:  Soft.  It is tender to palpation over the fundal area.  EXTREMITIES:  Show 1+ edema.  DTRs were 2+ and brisk with no clonus.  There is no calf tenderness noted bilaterally.  PELVIC:  Fetal heart rate baseline is 130S with positive variability,  positive accelerations, and reactivity is present with no periodic  changes.  The patient is contracting irregularly.   PIH labs are all within normal limits. Urine is negative for protein.   ASSESSMENT:  1. Intrauterine pregnancy at 33-6/7 weeks.  2. Pregnancy-induced hypertension.  3. Rule out preeclampsia.  4. Type 1 diabetes.   PLAN:  1. Admit for 23-hour observation per Dr. Osborn Coho.  2. Obtain results of 24-hour urine returned today with the rest of      orders as written.      Rica Koyanagi, C.N.M.      Osborn Coho, M.D.  Electronically Signed    SDM/MEDQ  D:  10/16/2006  T:  10/16/2006  Job:  [id number]

## 2011-02-03 NOTE — H&P (Signed)
[name], DWIGHT NO.:  [id number]   MEDICAL RECORD NO.:  [id number]          PATIENT TYPE:  INP   LOCATION:  5505                         FACILITY:  [facility]   PHYSICIAN:  Marcene Duos, M.D.DATE OF BIRTH:  08-14-1981   DATE OF ADMISSION:  09/30/2004  DATE OF DISCHARGE:                                HISTORY & PHYSICAL   CHIEF COMPLAINT:  Polyuria, polydipsia, general malaise, blurred vision.   HISTORY OF PRESENT ILLNESS:  This is a 30 year old female with a history of  polydipsia and polyuria for about 1-1/2 to 2 weeks.  The patient has been  significantly increasing her juice intake to keep up with her thirst.  Two  days ago, she developed blurred vision and felt dizzy and lightheaded.  Two  evenings ago, they checked her blood sugar and it was 260.  She denies  nausea or vomiting, abdominal pain.  The patient has not noticed a fruity  smell to her breath, nor has her family.  The patient has been treated for a  sinusitis with Augmentin which will be completed in 2 days.  This is through  Dr. Jennye Moccasin office.  That has been improving.  Family history very  significant for non insulin-dependent diabetes mellitus, including her  grandmother who is also a patient of mine. The patient has noticed also  significant weight loss in the last 1-1/2 weeks.  The patient weighed 160  pounds prior to that and is down now to 150.5 pounds.  She was seen in our  office for the above symptoms yesterday.  Glucose check at that time was  421.  She actually looked fairly well despite her high sugar at the time.  Laboratory showed a serum glucose of 360, sodium was mildly low at 134,  which would correct to normal.  Bicarbonate was 29, BUN 14, creatinine 0.9,  TSH 2.03.  CBC was normal with a white count of 5, hemoglobin 14.1,  platelets 308.  A1C was 9.3% and a C-peptide was 1.3, normal range 1.1 to  5.0.  The patient was initiated on Glucotrol 5 mg daily and was  following up  today.  She did, however, go to work, as it was recommended that she do not,  and became very lightheaded and fatigued there.  She also ate a lot of  carbohydrates, despite our discussion yesterday, and sugars were remaining  in the 300 to 400 range.  She is following up today without improvement.   PAST MEDICAL HISTORY:  1.  Seasonal allergies, followed by Dr. Lacretia Nicks.  2. Headaches, possibly      migraines.  3. History of Chiari malformation.  4. 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  5. Menometrorrhagia,      dysmenorrhea.  6. Depression.   PAST SURGICAL HISTORY:  1.  Tonsillectomy.  2. Wisdom tooth extraction.  3. Chiari decompression      12/01, Dr. Lovell Sheehan.   CURRENT MEDICATIONS:  1.  Allegra 180 mg daily.  2. Lexapro 10 mg daily.  3. Calcium and Ortho-      Novum, I believe 7/7/7.   ALLERGIES:  Codeine.   FAMILY HISTORY:  Grandmother with non insulin-dependent diabetes mellitus,  noncompliance.  She also has CML, hypertension, hyperlipidemia, obesity,  coronary artery disease.  Mother, 83, generally healthy.  Father, age 53,  healthy.  Sister, age 52, healthy.  Grandfather died at age 46 of heart  disease, TIAs.   SOCIAL HISTORY:  Single, lives at home with her parents.  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  Tobacco use, none.  Alcohol, none.   PHYSICAL EXAMINATION:  VITAL SIGNS:  Blood pressure 120/80, pulse 70, weight  150.5 pounds, down 1.5 pounds from yesterday.  Pale.  HEENT:  Conjunctivae without injection.  Pupils equal, round, reactive to  light.  Tympanic membranes pearly gray.  Throat without injection.  Mouth  appears actually fairly moist.  NECK:  Supple without adenopathy.  CHEST:  Clear.  CARDIOVASCULAR:  Regular rate and rhythm without murmur, rub.  Radial pulses  _________ and equal.  ABDOMEN:  Soft, nontender.  No organomegaly or  masses appreciated.   ASSESSMENT AND PLAN:  1.  New onset diabetes mellitus.  Appears to be probably non insulin-      dependent at this time, although her C-peptide is low-normal.  Will      continue her on the Glucotrol, increased to 10 mg in the morning, 5 mg      at night.  Insulin sensitive Humalog sliding scale q.a.c. and q.h.s.      Will have diabetic teaching and dietary see her and discuss treatment.      She is familiar now with the Glucometer that she received yesterday, but      really needs information on diet and we have not yet had a chance to      discuss complications without good control.  We will also begin IV      rehydration with normal saline.  BMET on admission and in the morning      and also fasting lipid profile in the morning.  Previously, cholesterol      has been okay.  Hopefully, we will be able to get her sugars under      control and just get her on oral medications.  2. Dehydration, as above.      3. Depression.  Continue Lexapro.  4. Sinusitis, resolving.  Continue      Augmentin for completed course through Sunday, October 02, 2004.       EMM/MEDQ  D:  09/30/2004  T:  09/30/2004  Job:  [id number]   cc:   Hoy Register, M.D.  Fax: [phone]

## 2011-06-13 LAB — BASIC METABOLIC PANEL
CO2: 24
CO2: 25
Calcium: 11.3 — ABNORMAL HIGH
Calcium: 8.2 — ABNORMAL LOW
Creatinine, Ser: 0.62
GFR calc non Af Amer: >60
Glucose, Bld: 469 — ABNORMAL HIGH
Glucose, Bld: 93
Sodium: 128 — ABNORMAL LOW
Sodium: 138

## 2011-06-13 LAB — COMPREHENSIVE METABOLIC PANEL
Alkaline Phosphatase: 90
BUN: 14
CO2: 25
Chloride: 104
GFR calc non Af Amer: >60
Glucose, Bld: 157 — ABNORMAL HIGH
Potassium: 3.6
Total Bilirubin: 0.9
Total Protein: 6.5

## 2011-07-22 ENCOUNTER — Emergency Department (HOSPITAL_COMMUNITY)
Admission: EM | Admit: 2011-07-22 | Discharge: 2011-07-22 | Disposition: A | Discharge status: Home / Self Care | Payer: PRIVATE HEALTH INSURANCE | Attending: Emergency Medicine | Admitting: Emergency Medicine

## 2011-07-22 DIAGNOSIS — R3589 Other polyuria: Secondary | ICD-10-CM | POA: Insufficient documentation

## 2011-07-22 DIAGNOSIS — E119 Type 2 diabetes mellitus without complications: Secondary | ICD-10-CM | POA: Insufficient documentation

## 2011-07-22 DIAGNOSIS — R631 Polydipsia: Secondary | ICD-10-CM | POA: Insufficient documentation

## 2011-07-22 DIAGNOSIS — Z9889 Other specified postprocedural states: Secondary | ICD-10-CM | POA: Insufficient documentation

## 2011-07-22 DIAGNOSIS — Z794 Long term (current) use of insulin: Secondary | ICD-10-CM | POA: Insufficient documentation

## 2011-07-22 DIAGNOSIS — R5381 Other malaise: Secondary | ICD-10-CM | POA: Insufficient documentation

## 2011-07-22 DIAGNOSIS — R358 Other polyuria: Secondary | ICD-10-CM | POA: Insufficient documentation

## 2011-07-22 DIAGNOSIS — R5383 Other fatigue: Secondary | ICD-10-CM | POA: Insufficient documentation

## 2011-07-22 LAB — URINALYSIS, ROUTINE W REFLEX MICROSCOPIC
Glucose, UA: 250 mg/dL — AB
Leukocytes, UA: NEGATIVE
Nitrite: NEGATIVE
Specific Gravity, Urine: 1.043 — ABNORMAL HIGH (ref 1.005–1.030)
pH: 5.5 (ref 5.0–8.0)

## 2011-07-22 LAB — BASIC METABOLIC PANEL
CO2: 20 mEq/L (ref 19–32)
Chloride: 93 mEq/L — ABNORMAL LOW (ref 96–112)
Creatinine, Ser: 0.69 mg/dL (ref 0.50–1.10)
Glucose, Bld: 495 mg/dL — ABNORMAL HIGH (ref 70–99)

## 2011-07-22 LAB — BLOOD GAS, VENOUS
Acid-base deficit: 4.5 mmol/L — ABNORMAL HIGH (ref 0.0–2.0)
Bicarbonate: 19.9 mEq/L — ABNORMAL LOW (ref 20.0–24.0)
FIO2: 0.21 %
O2 Saturation: 78.9 %
O2 Saturation: 76.8 %
Patient temperature: 98.6
Patient temperature: 98.6
TCO2: 17.7 mmol/L (ref 0–100)
pO2, Ven: 44.1 mmHg (ref 30.0–45.0)

## 2011-07-22 LAB — GLUCOSE, CAPILLARY
Glucose-Capillary: 267 mg/dL — ABNORMAL HIGH (ref 70–99)
Glucose-Capillary: 215 mg/dL — ABNORMAL HIGH (ref 70–99)
Glucose-Capillary: 190 mg/dL — ABNORMAL HIGH (ref 70–99)

## 2011-07-22 LAB — DIFFERENTIAL
Eosinophils Absolute: 0.1 10*3/uL (ref 0.0–0.7)
Lymphocytes Relative: 28 % (ref 12–46)
Lymphs Abs: 1.9 10*3/uL (ref 0.7–4.0)
Monocytes Relative: 10 % (ref 3–12)
Neutro Abs: 4 10*3/uL (ref 1.7–7.7)
Neutrophils Relative %: 60 % (ref 43–77)

## 2011-07-22 LAB — CBC
Hemoglobin: 14.5 g/dL (ref 12.0–15.0)
MCH: 31.2 pg (ref 26.0–34.0)
MCV: 86.9 fL (ref 78.0–100.0)
Platelets: 407 10*3/uL — ABNORMAL HIGH (ref 150–400)
RBC: 4.65 MIL/uL (ref 3.87–5.11)
WBC: 6.7 10*3/uL (ref 4.0–10.5)

## 2011-07-22 LAB — POCT I-STAT, CHEM 8
Hemoglobin: 12.9 g/dL (ref 12.0–15.0)
Potassium: 3.4 mEq/L — ABNORMAL LOW (ref 3.5–5.1)
Sodium: 139 mEq/L (ref 135–145)
TCO2: 22 mmol/L (ref 0–100)

## 2011-09-22 ENCOUNTER — Encounter: Payer: Self-pay | Admitting: Emergency Medicine

## 2011-09-22 ENCOUNTER — Inpatient Hospital Stay (HOSPITAL_COMMUNITY)
Admission: EM | Admit: 2011-09-22 | Discharge: 2011-09-26 | DRG: 638 | Disposition: A | Discharge status: Home / Self Care | Payer: PRIVATE HEALTH INSURANCE | Attending: Internal Medicine | Admitting: Internal Medicine

## 2011-09-22 ENCOUNTER — Emergency Department (HOSPITAL_COMMUNITY): Payer: PRIVATE HEALTH INSURANCE

## 2011-09-22 DIAGNOSIS — R0989 Other specified symptoms and signs involving the circulatory and respiratory systems: Secondary | ICD-10-CM | POA: Diagnosis present

## 2011-09-22 DIAGNOSIS — Z888 Allergy status to other drugs, medicaments and biological substances status: Secondary | ICD-10-CM

## 2011-09-22 DIAGNOSIS — R0609 Other forms of dyspnea: Secondary | ICD-10-CM | POA: Diagnosis present

## 2011-09-22 DIAGNOSIS — Z794 Long term (current) use of insulin: Secondary | ICD-10-CM

## 2011-09-22 DIAGNOSIS — E1110 Type 2 diabetes mellitus with ketoacidosis without coma: Secondary | ICD-10-CM | POA: Diagnosis present

## 2011-09-22 DIAGNOSIS — E1010 Type 1 diabetes mellitus with ketoacidosis without coma: Principal | ICD-10-CM | POA: Diagnosis present

## 2011-09-22 DIAGNOSIS — E871 Hypo-osmolality and hyponatremia: Secondary | ICD-10-CM | POA: Diagnosis present

## 2011-09-22 DIAGNOSIS — E875 Hyperkalemia: Secondary | ICD-10-CM | POA: Diagnosis present

## 2011-09-22 LAB — BASIC METABOLIC PANEL
BUN: 13 mg/dL (ref 6–23)
BUN: 16 mg/dL (ref 6–23)
CO2: 8 mEq/L — CL (ref 19–32)
CO2: 10 mEq/L — CL (ref 19–32)
CO2: 16 mEq/L — ABNORMAL LOW (ref 19–32)
CO2: 19 mEq/L (ref 19–32)
CO2: 15 mEq/L — ABNORMAL LOW (ref 19–32)
CO2: 15 mEq/L — ABNORMAL LOW (ref 19–32)
CO2: 18 mEq/L — ABNORMAL LOW (ref 19–32)
Calcium: 10.1 mg/dL (ref 8.4–10.5)
Calcium: 8.6 mg/dL (ref 8.4–10.5)
Calcium: 8.4 mg/dL (ref 8.4–10.5)
Calcium: 7.8 mg/dL — ABNORMAL LOW (ref 8.4–10.5)
Chloride: 100 mEq/L (ref 96–112)
Chloride: 103 mEq/L (ref 96–112)
Chloride: 103 mEq/L (ref 96–112)
Chloride: 104 mEq/L (ref 96–112)
Creatinine, Ser: 0.81 mg/dL (ref 0.50–1.10)
Creatinine, Ser: 0.65 mg/dL (ref 0.50–1.10)
Creatinine, Ser: 0.59 mg/dL (ref 0.50–1.10)
Creatinine, Ser: 0.6 mg/dL (ref 0.50–1.10)
GFR calc Af Amer: >90 mL/min (ref >90)
GFR calc Af Amer: >90 mL/min (ref >90)
GFR calc non Af Amer: >90 mL/min (ref >90)
GFR calc non Af Amer: >90 mL/min (ref >90)
GFR calc non Af Amer: >90 mL/min (ref >90)
Glucose, Bld: 142 mg/dL — ABNORMAL HIGH (ref 70–99)
Glucose, Bld: 195 mg/dL — ABNORMAL HIGH (ref 70–99)
Potassium: 4.6 mEq/L (ref 3.5–5.1)
Potassium: 3.8 mEq/L (ref 3.5–5.1)
Potassium: 3.7 mEq/L (ref 3.5–5.1)
Sodium: 127 mEq/L — ABNORMAL LOW (ref 135–145)
Sodium: 130 mEq/L — ABNORMAL LOW (ref 135–145)
Sodium: 133 mEq/L — ABNORMAL LOW (ref 135–145)
Sodium: 133 mEq/L — ABNORMAL LOW (ref 135–145)

## 2011-09-22 LAB — CBC
HCT: 44.2 % (ref 36.0–46.0)
HCT: 29 % — ABNORMAL LOW (ref 36.0–46.0)
MCH: 31.1 pg (ref 26.0–34.0)
MCHC: 33 g/dL (ref 30.0–36.0)
MCV: 94.2 fL (ref 78.0–100.0)
MCV: 91.2 fL (ref 78.0–100.0)
RBC: 3.18 MIL/uL — ABNORMAL LOW (ref 3.87–5.11)
RDW: 14.1 % (ref 11.5–15.5)
WBC: 6.5 10*3/uL (ref 4.0–10.5)

## 2011-09-22 LAB — CARDIAC PANEL(CRET KIN+CKTOT+MB+TROPI)
CK, MB: 3.2 ng/mL (ref 0.3–4.0)
Total CK: 54 U/L (ref 7–177)

## 2011-09-22 LAB — URINE MICROSCOPIC-ADD ON

## 2011-09-22 LAB — GLUCOSE, CAPILLARY
Glucose-Capillary: 238 mg/dL — ABNORMAL HIGH (ref 70–99)
Glucose-Capillary: 151 mg/dL — ABNORMAL HIGH (ref 70–99)
Glucose-Capillary: 259 mg/dL — ABNORMAL HIGH (ref 70–99)
Glucose-Capillary: 210 mg/dL — ABNORMAL HIGH (ref 70–99)
Glucose-Capillary: 171 mg/dL — ABNORMAL HIGH (ref 70–99)
Glucose-Capillary: 121 mg/dL — ABNORMAL HIGH (ref 70–99)

## 2011-09-22 LAB — DIFFERENTIAL
Basophils Absolute: 0.1 10*3/uL (ref 0.0–0.1)
Eosinophils Relative: 1 % (ref 0–5)
Lymphocytes Relative: 16 % (ref 12–46)
Monocytes Absolute: 0.7 10*3/uL (ref 0.1–1.0)

## 2011-09-22 LAB — URINALYSIS, ROUTINE W REFLEX MICROSCOPIC
Bilirubin Urine: NEGATIVE
Glucose, UA: >1000 mg/dL — AB
Ketones, ur: >80 mg/dL — AB
pH: 5 (ref 5.0–8.0)

## 2011-09-22 LAB — MRSA PCR SCREENING: MRSA by PCR: NEGATIVE

## 2011-09-22 MED ORDER — ONDANSETRON HCL 4 MG/2ML IJ SOLN
4.0000 mg | Freq: Four times a day (QID) | INTRAMUSCULAR | Status: DC | PRN
  Administered 2011-09-23: 4 mg via INTRAVENOUS
  Filled 2011-09-22: qty 2

## 2011-09-22 MED ORDER — POTASSIUM CHLORIDE 10 MEQ/100ML IV SOLN
10.0000 meq | INTRAVENOUS | Status: AC
  Filled 2011-09-22: qty 100

## 2011-09-22 MED ORDER — SODIUM CHLORIDE 0.9 % IV SOLN
Freq: Once | INTRAVENOUS | Status: AC
  Administered 2011-09-22: 09:00:00 via INTRAVENOUS

## 2011-09-22 MED ORDER — SODIUM CHLORIDE 0.9 % IV SOLN
INTRAVENOUS | Status: DC
  Filled 2011-09-22: qty 1

## 2011-09-22 MED ORDER — SODIUM CHLORIDE 0.9 % IV SOLN: INTRAVENOUS | Status: DC

## 2011-09-22 MED ORDER — ONDANSETRON HCL 4 MG/2ML IJ SOLN
4.0000 mg | Freq: Once | INTRAMUSCULAR | Status: AC
  Administered 2011-09-22: 4 mg via INTRAVENOUS
  Filled 2011-09-22: qty 2

## 2011-09-22 MED ORDER — DEXTROSE 50 % IV SOLN: 25.0000 mL | INTRAVENOUS | Status: DC | PRN

## 2011-09-22 MED ORDER — SODIUM CHLORIDE 0.9 % IV SOLN
Freq: Once | INTRAVENOUS | Status: AC
  Administered 2011-09-22: 07:00:00 via INTRAVENOUS

## 2011-09-22 MED ORDER — INSULIN GLARGINE 100 UNIT/ML ~~LOC~~ SOLN
15.0000 [IU] | Freq: Every day | SUBCUTANEOUS | Status: DC
  Administered 2011-09-22: 15 [IU] via SUBCUTANEOUS
  Filled 2011-09-22: qty 3

## 2011-09-22 MED ORDER — POTASSIUM CHLORIDE 10 MEQ/100ML IV SOLN
10.0000 meq | INTRAVENOUS | Status: AC
  Administered 2011-09-22 (×2): 10 meq via INTRAVENOUS
  Filled 2011-09-22: qty 100

## 2011-09-22 MED ORDER — SODIUM CHLORIDE 0.9 % IV SOLN
INTRAVENOUS | Status: DC
  Administered 2011-09-22 (×2): via INTRAVENOUS

## 2011-09-22 MED ORDER — DEXTROSE-NACL 5-0.45 % IV SOLN: INTRAVENOUS | Status: DC

## 2011-09-22 MED ORDER — SODIUM CHLORIDE 0.9 % IV SOLN
INTRAVENOUS | Status: DC
  Administered 2011-09-22: 3 [IU]/h via INTRAVENOUS

## 2011-09-22 MED ORDER — INSULIN REGULAR BOLUS VIA INFUSION
0.0000 [IU] | Freq: Three times a day (TID) | INTRAVENOUS | Status: DC
  Filled 2011-09-22 (×6): qty 10

## 2011-09-22 MED ORDER — DEXTROSE-NACL 5-0.45 % IV SOLN
INTRAVENOUS | Status: DC
  Administered 2011-09-22 – 2011-09-23 (×3): via INTRAVENOUS

## 2011-09-22 MED ORDER — SODIUM CHLORIDE 0.9 % IV BOLUS (SEPSIS)
1000.0000 mL | Freq: Once | INTRAVENOUS | Status: AC
  Administered 2011-09-22: 1000 mL via INTRAVENOUS

## 2011-09-22 MED ORDER — ENOXAPARIN SODIUM 40 MG/0.4ML ~~LOC~~ SOLN
40.0000 mg | SUBCUTANEOUS | Status: DC
  Administered 2011-09-22 – 2011-09-25 (×4): 40 mg via SUBCUTANEOUS
  Filled 2011-09-22 (×5): qty 0.4

## 2011-09-22 MED ORDER — POTASSIUM CHLORIDE CRYS ER 20 MEQ PO TBCR
20.0000 meq | EXTENDED_RELEASE_TABLET | Freq: Two times a day (BID) | ORAL | Status: AC
  Administered 2011-09-22 (×2): 20 meq via ORAL
  Filled 2011-09-22: qty 1

## 2011-09-22 MED ORDER — INSULIN ASPART 100 UNIT/ML ~~LOC~~ SOLN
0.0000 [IU] | SUBCUTANEOUS | Status: DC
  Administered 2011-09-23: 5 [IU] via SUBCUTANEOUS
  Administered 2011-09-23 (×2): 2 [IU] via SUBCUTANEOUS
  Filled 2011-09-22: qty 3

## 2011-09-22 NOTE — H&P (Signed)
[name] MRN: [id number] DOB/AGE: March 06, 1981 31 y.o. Primary Care Physician:KERR,JEFFREY, MD  PRIMARY CARE PHYSICIAN:  Theatre stage manager at [facility].      ENDOCRINOLOGIST:  Dr. Sharl Ma, also in the same location.  Admit date: 09/22/2011 Chief Complaint: Hyperglycemia    HPI:  The patient is a 31 year old Caucasian   female who has a past medical history of diabetes, which is type 1, that   she has had about 7 years. She has had elevated CBGs for the last 6 hours. The patient tells me that over the past   month or so she has been having problems with her insulin pump.Patient woke from sleep short of breath checked her blood sugar and was elevated she presents here. History of type 1 diabetes and she is on a sliding scale insulin. She is out of her long acting insulin. Patient had emesis x1, denies any diarrhea or fever. No cough or chest pain noted. Nothing makes her symptoms better or worse    She tells me that her CBGs   usually run about 200, which implies poor control.    No history of any fever,   chills or dysuria.  She has been feeling a little bit short of breath,   but denies any cough or chest pain.  Denies any sick contacts at home or   elsewhere.  Since the symptoms did not improve, she decided to come into   the hospital for further evaluation.   Past Medical History  Diagnosis Date  . Diabetes mellitus      Positive for diabetes type 1 for the past 7   years, history of migraine headaches, history of depression and seasonal   allergies  Past Surgical History  Procedure Date  . Tonsillectomy   . Cesarean section   . Chiari malformation repair     Prior to Admission medications   Medication Sig Start Date End Date Taking? Authorizing Provider  insulin aspart (NOVOLOG) 100 UNIT/ML injection Inject 25 Units into the skin 3 (three) times daily before meals. 1 unit for every 4 gm carb   Yes Historical Provider, MD  insulin glargine (LANTUS) 100 UNIT/ML injection  Inject 26 Units into the skin at bedtime.     Yes Historical Provider, MD  Multiple Vitamin (MULTIVITAMIN) tablet Take 1 tablet by mouth daily.      Historical Provider, MD    Allergies:  Allergies  Allergen Reactions  . Codeine Nausea And Vomiting    Family History  Problem Relation Age of Onset  . Heart failure Other    SOCIAL HISTORY:  She lives in [place] with her husband and is currently   unemployed.  Denies smoking, alcohol or illicit drug use.      FAMILY HISTORY:  Positive for thyroid disorder.  Otherwise unremarkable.     ROS complete 14 point review of systems was done, but pertinent positives in history of present illness.  PHYSICAL EXAM: Blood pressure 110/68, pulse 110, temperature 98.2 F (36.8 C), temperature source Oral, resp. rate 20, last menstrual period 09/05/2011, SpO2 100.00%. Constitutional: She is oriented to person, place, and time. She appears well-developed and well-nourished. Non-toxic appearance. No distress.  HENT:  Head: Normocephalic and atraumatic.  Eyes: Conjunctivae, EOM and lids are normal. Pupils are equal, round, and reactive to light.  Neck: Normal range of motion. Neck supple. No tracheal deviation present. No mass present.  Cardiovascular: Regular rhythm and normal heart sounds. Tachycardia present. Exam reveals no gallop.  No murmur  heard.  Pulmonary/Chest: Effort normal and breath sounds normal. No stridor. No respiratory distress. She has no decreased breath sounds. She has no wheezes. She has no rhonchi. She has no rales.  Abdominal: Soft. Normal appearance and bowel sounds are normal. She exhibits no distension. There is no tenderness. There is no rebound and no CVA tenderness.  Musculoskeletal: Normal range of motion. She exhibits no edema and no tenderness.  Neurological: She is alert and oriented to person, place, and time. She has normal strength. No cranial nerve deficit or sensory deficit. GCS eye subscore is 4. GCS verbal  subscore is 5. GCS motor subscore is 6.  Skin: Skin is warm and dry. No abrasion and no rash noted.  Psychiatric: She has a normal mood and affect. Her speech is normal and behavior is normal.    No results found for this or any previous visit (from the past 240 hour(s)).   Results for orders placed during the hospital encounter of 09/22/11 (from the past 48 hour(s))  GLUCOSE, CAPILLARY     Status: Abnormal   Collection Time   09/22/11  5:05 AM      Component Value Range Comment   Glucose-Capillary >600 (*) 70 - 99 (mg/dL)    Comment 1 Notify RN     CBC     Status: Abnormal   Collection Time   09/22/11  5:44 AM      Component Value Range Comment   WBC 13.8 (*) 4.0 - 10.5 (K/uL)    RBC 4.69  3.87 - 5.11 (MIL/uL)    Hemoglobin 14.6  12.0 - 15.0 (g/dL)    HCT 16.1  09.6 - 04.5 (%)    MCV 94.2  78.0 - 100.0 (fL)    MCH 31.1  26.0 - 34.0 (pg)    MCHC 33.0  30.0 - 36.0 (g/dL)    RDW 40.9  81.1 - 91.4 (%)    Platelets 649 (*) 150 - 400 (K/uL)   DIFFERENTIAL     Status: Abnormal   Collection Time   09/22/11  5:44 AM      Component Value Range Comment   Neutrophils Relative 78 (*) 43 - 77 (%)    Neutro Abs 10.8 (*) 1.7 - 7.7 (K/uL)    Lymphocytes Relative 16  12 - 46 (%)    Lymphs Abs 2.2  0.7 - 4.0 (K/uL)    Monocytes Relative 5  3 - 12 (%)    Monocytes Absolute 0.7  0.1 - 1.0 (K/uL)    Eosinophils Relative 1  0 - 5 (%)    Eosinophils Absolute 0.1  0.0 - 0.7 (K/uL)    Basophils Relative 0  0 - 1 (%)    Basophils Absolute 0.1  0.0 - 0.1 (K/uL)   BASIC METABOLIC PANEL     Status: Abnormal   Collection Time   09/22/11  5:44 AM      Component Value Range Comment   Sodium 124 (*) 135 - 145 (mEq/L)    Potassium 5.4 (*) 3.5 - 5.1 (mEq/L) MODERATE HEMOLYSIS   Chloride 84 (*) 96 - 112 (mEq/L)    CO2 8 (*) 19 - 32 (mEq/L)    Glucose, Bld 488 (*) 70 - 99 (mg/dL)    BUN 22  6 - 23 (mg/dL)    Creatinine, Ser 7.82  0.50 - 1.10 (mg/dL)    Calcium 95.6  8.4 - 10.5 (mg/dL)    GFR calc non Af Amer  >90  >90 (mL/min)  GFR calc Af Amer >90  >90 (mL/min)   BLOOD GAS, VENOUS     Status: Abnormal   Collection Time   09/22/11  6:15 AM      Component Value Range Comment   FIO2 .21      pH, Ven 7.062 (*) 7.250 - 7.300     pCO2, Ven 30.3 (*) 45.0 - 50.0 (mmHg)    pO2, Ven 26.3 (*) 30.0 - 45.0 (mmHg)    Bicarbonate 8.2 (*) 20.0 - 24.0 (mEq/L)    TCO2 8.1  0 - 100 (mmol/L)    Acid-base deficit 21.9 (*) 0.0 - 2.0 (mmol/L)    O2 Saturation 33.0      Patient temperature 98.6      Collection site VEIN      Drawn by COLLECTED BY LABORATORY      Sample type VENOUS     GLUCOSE, CAPILLARY     Status: Abnormal   Collection Time   09/22/11  7:01 AM      Component Value Range Comment   Glucose-Capillary 238 (*) 70 - 99 (mg/dL)   BASIC METABOLIC PANEL     Status: Abnormal   Collection Time   09/22/11  7:45 AM      Component Value Range Comment   Sodium 127 (*) 135 - 145 (mEq/L)    Potassium 3.3 (*) 3.5 - 5.1 (mEq/L)    Chloride 95 (*) 96 - 112 (mEq/L)    CO2 10 (*) 19 - 32 (mEq/L)    Glucose, Bld 215 (*) 70 - 99 (mg/dL)    BUN 17  6 - 23 (mg/dL)    Creatinine, Ser 4.09  0.50 - 1.10 (mg/dL)    Calcium 8.6  8.4 - 10.5 (mg/dL)    GFR calc non Af Amer >90  >90 (mL/min)    GFR calc Af Amer >90  >90 (mL/min)   BLOOD GAS, ARTERIAL     Status: Abnormal (Preliminary result)   Collection Time   09/22/11  7:55 AM      Component Value Range Comment   FIO2 .21      O2 Content PENDING      pH, Arterial 7.306 (*) 7.350 - 7.400     pCO2 arterial 29.4 (*) 35.0 - 45.0 (mmHg)    pO2, Arterial 99.9  80.0 - 100.0 (mmHg)    Bicarbonate 14.2 (*) 20.0 - 24.0 (mEq/L)    TCO2 13.3  0 - 100 (mmol/L)    Acid-base deficit 10.6 (*) 0.0 - 2.0 (mmol/L)    O2 Saturation 97.5      Patient temperature 98.6      Collection site LEFT RADIAL      Drawn by COLLECTED BY RT      Sample type ARTERIAL DRAW      Allens test (pass/fail) PASS  PASS    GLUCOSE, CAPILLARY     Status: Abnormal   Collection Time   09/22/11  8:01 AM        Component Value Range Comment   Glucose-Capillary 162 (*) 70 - 99 (mg/dL)    Comment 1 Documented in Chart      Comment 2 Notify RN     URINALYSIS, ROUTINE W REFLEX MICROSCOPIC     Status: Abnormal   Collection Time   09/22/11  8:07 AM      Component Value Range Comment   Color, Urine YELLOW  YELLOW     APPearance CLOUDY (*) CLEAR     Specific Gravity, Urine 1.025  1.005 - 1.030     pH 5.0  5.0 - 8.0     Glucose, UA >1000 (*) NEGATIVE (mg/dL)    Hgb urine dipstick TRACE (*) NEGATIVE     Bilirubin Urine NEGATIVE  NEGATIVE     Ketones, ur >80 (*) NEGATIVE (mg/dL)    Protein, ur 30 (*) NEGATIVE (mg/dL)    Urobilinogen, UA 0.2  0.0 - 1.0 (mg/dL)    Nitrite NEGATIVE  NEGATIVE     Leukocytes, UA NEGATIVE  NEGATIVE    URINE MICROSCOPIC-ADD ON     Status: Abnormal   Collection Time   09/22/11  8:07 AM      Component Value Range Comment   Squamous Epithelial / LPF RARE  RARE     WBC, UA 3-6  <3 (WBC/hpf)    RBC / HPF 0-2  <3 (RBC/hpf)    Bacteria, UA FEW (*) RARE     Casts GRANULAR CAST (*) NEGATIVE    POCT PREGNANCY, URINE     Status: Normal   Collection Time   09/22/11  8:17 AM      Component Value Range Comment   Preg Test, Ur NEGATIVE     GLUCOSE, CAPILLARY     Status: Abnormal   Collection Time   09/22/11  8:43 AM      Component Value Range Comment   Glucose-Capillary 151 (*) 70 - 99 (mg/dL)     Dg Chest 2 View  10/22/4008  *RADIOLOGY REPORT*  Clinical Data: Chest pain and acute shortness of breath  CHEST - 2 VIEW  Comparison: 12/14/2010  Findings: Slightly shallow inspiration. The heart size and pulmonary vascularity are normal. The lungs appear clear and expanded without focal air space disease or consolidation. No blunting of the costophrenic angles.  No pneumothorax.  No significant change since prior study.  IMPRESSION: No evidence of active pulmonary disease.  Original Report Authenticated By: Marlon Pel, M.D.    Impression:   PLAN:   1. DKA will be managed with  the DKA protocol.  She will be given IV       fluids and insulin intravenously.  Electrolytes will be checked q.3-4       h.  She does have hyperkalemia, however, this should correct with       insulin and IV fluids.  If it does not go down by the next blood       draw, definitive therapy will be required.   2. Leukocytosis is probably because of dehydration. She is       afebrile, so we will hold off on any antibiotics for now.   4. Dyspnea:  She is saturating 100% and the lungs are clear.  This is       probably due to the acidosis.  We will monitor for now.   5. DVT prophylaxis with heparin.   6. HbA1c will be checked.  7. She is a full code.              Tylie Golonka 09/22/2011, 9:29 AM

## 2011-09-22 NOTE — Progress Notes (Signed)
ED CM consulted by ED RN stating pt concern about seeing a financial counselor and trouble getting medicines.  CM spoke with pt about DSS for medicaid application, financial counselor can see pt after 24 hrs to assist with medicaid if needed, low cost pharmacies, www.needymeds.com for pt assistance programs, and local health department medication assistance programs. Provided with written information about the discussed resources.  Pt states she is only person working in her home but has insurance coverage.  Cm also referred pt to her insurance company contact information on insurance card (contact number & web site) to view medications tiers. Referred to pcp/specialists to discuss decreasing cost of medications if possible (using generics, samples, etc)

## 2011-09-22 NOTE — ED Notes (Signed)
EDP notified of VBG results, second PIV will be started.

## 2011-09-22 NOTE — ED Notes (Signed)
Pt states her blood sugar is elevated  States it was 140 when she went to bed at 9pm last night  Pt states she woke up at 2am having shortness of breath  Pt states the shortness of breath got worse at work [employer] reading here is too high to read  Pt states she is currently out of her lantus insulin and has been for about a week  Pt states her dr will not see her because she has an outstanding balance with the office

## 2011-09-22 NOTE — ED Provider Notes (Signed)
History     CSN: [id number]  Arrival date & time 09/22/11  0455   First MD Initiated Contact with Patient 09/22/11 [phone]      Chief Complaint  Patient presents with  . Hyperglycemia    (Consider location/radiation/quality/duration/timing/severity/associated sxs/prior treatment) The history is provided by the patient.   patient here with high blood sugar x6 hours. Patient woke from sleep short of breath checked her blood sugar and was elevated she presents here. History of type 1 diabetes and she is on a sliding scale insulin. She is out of her long acting insulin. Patient had emesis x1, denies any diarrhea or fever. No cough or chest pain noted. Nothing makes her symptoms better or worse  Past Medical History  Diagnosis Date  . Diabetes mellitus     Past Surgical History  Procedure Date  . Tonsillectomy   . Cesarean section   . Chiari malformation repair     Family History  Problem Relation Age of Onset  . Heart failure Other     History  Substance Use Topics  . Smoking status: Never Smoker   . Smokeless tobacco: Not on file  . Alcohol Use: No    OB History    Grav Para Term Preterm Abortions TAB SAB Ect Mult Living                  Review of Systems  All other systems reviewed and are negative.    Allergies  Codeine  Home Medications   Current Outpatient Rx  Name Route Sig Dispense Refill  . INSULIN ASPART 100 UNIT/ML [place] SOLN Subcutaneous Inject into the skin 3 (three) times daily before meals. 1 unit for every 4 gm carb     . INSULIN GLARGINE 100 UNIT/ML [place] SOLN Subcutaneous Inject 26 Units into the skin at bedtime.      Marland Kitchen ONE-DAILY MULTI VITAMINS PO TABS Oral Take 1 tablet by mouth daily.        BP 145/103  Pulse 123  Temp(Src) 97.6 F (36.4 C) (Oral)  Resp 24  SpO2 100%  LMP 09/05/2011  Physical Exam  Nursing note and vitals reviewed. Constitutional: She is oriented to person, place, and time. She appears well-developed and well-nourished.   Non-toxic appearance. No distress.  HENT:  Head: Normocephalic and atraumatic.  Eyes: Conjunctivae, EOM and lids are normal. Pupils are equal, round, and reactive to light.  Neck: Normal range of motion. Neck supple. No tracheal deviation present. No mass present.  Cardiovascular: Regular rhythm and normal heart sounds.  Tachycardia present.  Exam reveals no gallop.   No murmur heard. Pulmonary/Chest: Effort normal and breath sounds normal. No stridor. No respiratory distress. She has no decreased breath sounds. She has no wheezes. She has no rhonchi. She has no rales.  Abdominal: Soft. Normal appearance and bowel sounds are normal. She exhibits no distension. There is no tenderness. There is no rebound and no CVA tenderness.  Musculoskeletal: Normal range of motion. She exhibits no edema and no tenderness.  Neurological: She is alert and oriented to person, place, and time. She has normal strength. No cranial nerve deficit or sensory deficit. GCS eye subscore is 4. GCS verbal subscore is 5. GCS motor subscore is 6.  Skin: Skin is warm and dry. No abrasion and no rash noted.  Psychiatric: She has a normal mood and affect. Her speech is normal and behavior is normal.    ED Course  Procedures (including critical care time)  Labs Reviewed  GLUCOSE, CAPILLARY - Abnormal; Notable for the following:    Glucose-Capillary >600 (*)    All other components within normal limits  CBC  DIFFERENTIAL  BASIC METABOLIC PANEL  BLOOD GAS, VENOUS  URINALYSIS, ROUTINE W REFLEX MICROSCOPIC  POCT PREGNANCY, URINE   No results found.   No diagnosis found.    MDM  Pt given iv fluids, blood sugar repeated and decreasing with fluids, will hold insulin for now, spoke with triad, will admit pending abg results        Toy Baker, MD 09/22/11 0800

## 2011-09-22 NOTE — ED Notes (Signed)
Patient transported to X-ray 

## 2011-09-23 ENCOUNTER — Inpatient Hospital Stay (HOSPITAL_COMMUNITY): Payer: PRIVATE HEALTH INSURANCE

## 2011-09-23 LAB — BASIC METABOLIC PANEL
BUN: 10 mg/dL (ref 6–23)
CO2: 18 mEq/L — ABNORMAL LOW (ref 19–32)
Calcium: 7.9 mg/dL — ABNORMAL LOW (ref 8.4–10.5)
Calcium: 8.3 mg/dL — ABNORMAL LOW (ref 8.4–10.5)
Chloride: 102 mEq/L (ref 96–112)
Chloride: 104 mEq/L (ref 96–112)
Creatinine, Ser: 0.45 mg/dL — ABNORMAL LOW (ref 0.50–1.10)
GFR calc Af Amer: >90 mL/min (ref >90)
GFR calc Af Amer: >90 mL/min (ref >90)
GFR calc non Af Amer: >90 mL/min (ref >90)
GFR calc non Af Amer: >90 mL/min (ref >90)
GFR calc non Af Amer: >90 mL/min (ref >90)
Glucose, Bld: 288 mg/dL — ABNORMAL HIGH (ref 70–99)
Potassium: 3.3 mEq/L — ABNORMAL LOW (ref 3.5–5.1)
Sodium: 133 mEq/L — ABNORMAL LOW (ref 135–145)
Sodium: 132 mEq/L — ABNORMAL LOW (ref 135–145)
Sodium: 136 mEq/L (ref 135–145)

## 2011-09-23 LAB — GLUCOSE, CAPILLARY
Glucose-Capillary: 140 mg/dL — ABNORMAL HIGH (ref 70–99)
Glucose-Capillary: 145 mg/dL — ABNORMAL HIGH (ref 70–99)
Glucose-Capillary: 190 mg/dL — ABNORMAL HIGH (ref 70–99)
Glucose-Capillary: 208 mg/dL — ABNORMAL HIGH (ref 70–99)
Glucose-Capillary: 150 mg/dL — ABNORMAL HIGH (ref 70–99)
Glucose-Capillary: 118 mg/dL — ABNORMAL HIGH (ref 70–99)
Glucose-Capillary: 142 mg/dL — ABNORMAL HIGH (ref 70–99)
Glucose-Capillary: 150 mg/dL — ABNORMAL HIGH (ref 70–99)
Glucose-Capillary: 144 mg/dL — ABNORMAL HIGH (ref 70–99)

## 2011-09-23 LAB — BLOOD GAS, ARTERIAL
Bicarbonate: 19.7 mEq/L — ABNORMAL LOW (ref 20.0–24.0)
FIO2: 0.21 %
Patient temperature: 98.6
TCO2: 18.1 mmol/L (ref 0–100)
pCO2 arterial: 35.4 mmHg (ref 35.0–45.0)
pH, Arterial: 7.365 (ref 7.350–7.400)
pO2, Arterial: 87.3 mmHg (ref 80.0–100.0)

## 2011-09-23 LAB — CBC
Hemoglobin: 10.3 g/dL — ABNORMAL LOW (ref 12.0–15.0)
MCH: 31.8 pg (ref 26.0–34.0)
Platelets: 335 10*3/uL (ref 150–400)
RBC: 3.24 MIL/uL — ABNORMAL LOW (ref 3.87–5.11)
WBC: 4.5 10*3/uL (ref 4.0–10.5)

## 2011-09-23 MED ORDER — DEXTROSE-NACL 5-0.9 % IV SOLN
INTRAVENOUS | Status: DC
  Administered 2011-09-23 – 2011-09-24 (×5): via INTRAVENOUS

## 2011-09-23 MED ORDER — POTASSIUM CHLORIDE 10 MEQ/100ML IV SOLN
10.0000 meq | INTRAVENOUS | Status: DC
  Filled 2011-09-23: qty 100

## 2011-09-23 MED ORDER — INSULIN ASPART 100 UNIT/ML ~~LOC~~ SOLN
4.0000 [IU] | Freq: Three times a day (TID) | SUBCUTANEOUS | Status: DC
  Administered 2011-09-24 – 2011-09-26 (×6): 4 [IU] via SUBCUTANEOUS

## 2011-09-23 MED ORDER — SODIUM CHLORIDE 0.9 % IV SOLN: INTRAVENOUS | Status: DC

## 2011-09-23 MED ORDER — POTASSIUM CHLORIDE CRYS ER 20 MEQ PO TBCR
20.0000 meq | EXTENDED_RELEASE_TABLET | Freq: Two times a day (BID) | ORAL | Status: AC
  Administered 2011-09-23 – 2011-09-24 (×2): 20 meq via ORAL
  Filled 2011-09-23 (×2): qty 1

## 2011-09-23 MED ORDER — GUAIFENESIN 100 MG/5ML PO SOLN: 5.0000 mL | Freq: Four times a day (QID) | ORAL | Status: DC | PRN

## 2011-09-23 MED ORDER — SODIUM CHLORIDE 0.9 % IV SOLN
INTRAVENOUS | Status: DC
  Administered 2011-09-23: 2.5 [IU]/h via INTRAVENOUS
  Administered 2011-09-23: 1.7 [IU]/h via INTRAVENOUS
  Administered 2011-09-23: 3.6 [IU]/h via INTRAVENOUS
  Administered 2011-09-23: 10.5 [IU]/h via INTRAVENOUS
  Filled 2011-09-23: qty 1

## 2011-09-23 MED ORDER — FLUCONAZOLE 150 MG PO TABS
150.0000 mg | ORAL_TABLET | Freq: Every day | ORAL | Status: DC
  Administered 2011-09-23 – 2011-09-24 (×2): 150 mg via ORAL
  Filled 2011-09-23 (×3): qty 1

## 2011-09-23 MED ORDER — DEXTROSE 50 % IV SOLN: 25.0000 mL | INTRAVENOUS | Status: DC | PRN

## 2011-09-23 MED ORDER — INSULIN ASPART 100 UNIT/ML ~~LOC~~ SOLN
0.0000 [IU] | Freq: Three times a day (TID) | SUBCUTANEOUS | Status: DC
  Administered 2011-09-24 (×3): 3 [IU] via SUBCUTANEOUS
  Administered 2011-09-24: 2 [IU] via SUBCUTANEOUS
  Administered 2011-09-25: 11 [IU] via SUBCUTANEOUS
  Administered 2011-09-25: 15 [IU] via SUBCUTANEOUS
  Administered 2011-09-25: 2 [IU] via SUBCUTANEOUS
  Administered 2011-09-26: 5 [IU] via SUBCUTANEOUS
  Filled 2011-09-23: qty 3

## 2011-09-23 MED ORDER — INSULIN ASPART 100 UNIT/ML ~~LOC~~ SOLN
0.0000 [IU] | Freq: Every day | SUBCUTANEOUS | Status: DC
  Administered 2011-09-24 – 2011-09-25 (×2): 2 [IU] via SUBCUTANEOUS

## 2011-09-23 MED ORDER — INSULIN GLARGINE 100 UNIT/ML ~~LOC~~ SOLN
20.0000 [IU] | Freq: Every day | SUBCUTANEOUS | Status: DC
  Administered 2011-09-23: 20 [IU] via SUBCUTANEOUS
  Filled 2011-09-23: qty 3

## 2011-09-23 MED ORDER — POTASSIUM CHLORIDE 10 MEQ/100ML IV SOLN
10.0000 meq | INTRAVENOUS | Status: AC
  Administered 2011-09-23 (×4): 10 meq via INTRAVENOUS
  Filled 2011-09-23 (×3): qty 100

## 2011-09-23 NOTE — Progress Notes (Signed)
Subjective: Not feeling too well yet, felt better yesterday  Objective: Vital signs in last 24 hours: Filed Vitals:   09/23/11 0425 09/23/11 0500 09/23/11 0749 09/23/11 0900  BP: 120/76  122/85   Pulse: 84  91 93  Temp:   97.5 F (36.4 C)   TempSrc:   Oral   Resp: 14  13 16   Height:      Weight:  71.3 kg (157 lb 3 oz)    SpO2: 98%  100% 100%    Intake/Output Summary (Last 24 hours) at 09/23/11 1135 Last data filed at 09/23/11 1015  Gross per 24 hour  Intake   3390 ml  Output   3001 ml  Net    389 ml    Weight change:   General: Alert, awake, oriented x3, in no acute distress. HEENT: No bruits, no goiter. Heart: Regular rate and rhythm, without murmurs, rubs, gallops. Lungs: Clear to auscultation bilaterally. Abdomen: Soft, nontender, nondistended, positive bowel sounds. Extremities: No clubbing cyanosis or edema with positive pedal pulses. Neuro: Grossly intact, nonfocal.    Lab Results: Results for orders placed during the hospital encounter of 09/22/11 (from the past 24 hour(s))  GLUCOSE, CAPILLARY     Status: Abnormal   Collection Time   09/22/11 12:39 PM      Component Value Range   Glucose-Capillary 259 (*) 70 - 99 (mg/dL)  BASIC METABOLIC PANEL     Status: Abnormal   Collection Time   09/22/11  1:40 PM      Component Value Range   Sodium 130 (*) 135 - 145 (mEq/L)   Potassium 4.6  3.5 - 5.1 (mEq/L)   Chloride 100  96 - 112 (mEq/L)   CO2 19  19 - 32 (mEq/L)   Glucose, Bld 324 (*) 70 - 99 (mg/dL)   BUN 15  6 - 23 (mg/dL)   Creatinine, Ser 4.09  0.50 - 1.10 (mg/dL)   Calcium 7.8 (*) 8.4 - 10.5 (mg/dL)   GFR calc non Af Amer >90  >90 (mL/min)   GFR calc Af Amer >90  >90 (mL/min)  GLUCOSE, CAPILLARY     Status: Abnormal   Collection Time   09/22/11  1:45 PM      Component Value Range   Glucose-Capillary 360 (*) 70 - 99 (mg/dL)  GLUCOSE, CAPILLARY     Status: Abnormal   Collection Time   09/22/11  2:44 PM      Component Value Range   Glucose-Capillary 262  (*) 70 - 99 (mg/dL)  BASIC METABOLIC PANEL     Status: Abnormal   Collection Time   09/22/11  3:30 PM      Component Value Range   Sodium 133 (*) 135 - 145 (mEq/L)   Potassium 3.8  3.5 - 5.1 (mEq/L)   Chloride 103  96 - 112 (mEq/L)   CO2 15 (*) 19 - 32 (mEq/L)   Glucose, Bld 195 (*) 70 - 99 (mg/dL)   BUN 16  6 - 23 (mg/dL)   Creatinine, Ser 8.11  0.50 - 1.10 (mg/dL)   Calcium 8.0 (*) 8.4 - 10.5 (mg/dL)   GFR calc non Af Amer >90  >90 (mL/min)   GFR calc Af Amer >90  >90 (mL/min)  CBC     Status: Abnormal   Collection Time   09/22/11  3:30 PM      Component Value Range   WBC 6.5  4.0 - 10.5 (K/uL)   RBC 3.18 (*) 3.87 - 5.11 (MIL/uL)  Hemoglobin 9.9 (*) 12.0 - 15.0 (g/dL)   HCT 16.1 (*) 09.6 - 46.0 (%)   MCV 91.2  78.0 - 100.0 (fL)   MCH 31.1  26.0 - 34.0 (pg)   MCHC 34.1  30.0 - 36.0 (g/dL)   RDW 04.5  40.9 - 81.1 (%)   Platelets 400  150 - 400 (K/uL)  GLUCOSE, CAPILLARY     Status: Abnormal   Collection Time   09/22/11  3:47 PM      Component Value Range   Glucose-Capillary 194 (*) 70 - 99 (mg/dL)  GLUCOSE, CAPILLARY     Status: Abnormal   Collection Time   09/22/11  4:53 PM      Component Value Range   Glucose-Capillary 210 (*) 70 - 99 (mg/dL)  BASIC METABOLIC PANEL     Status: Abnormal   Collection Time   09/22/11  6:04 PM      Component Value Range   Sodium 134 (*) 135 - 145 (mEq/L)   Potassium 3.7  3.5 - 5.1 (mEq/L)   Chloride 103  96 - 112 (mEq/L)   CO2 15 (*) 19 - 32 (mEq/L)   Glucose, Bld 187 (*) 70 - 99 (mg/dL)   BUN 16  6 - 23 (mg/dL)   Creatinine, Ser 9.14  0.50 - 1.10 (mg/dL)   Calcium 8.6  8.4 - 78.2 (mg/dL)   GFR calc non Af Amer >90  >90 (mL/min)   GFR calc Af Amer >90  >90 (mL/min)  GLUCOSE, CAPILLARY     Status: Abnormal   Collection Time   09/22/11  6:25 PM      Component Value Range   Glucose-Capillary 171 (*) 70 - 99 (mg/dL)   Comment 1 Documented in Chart     Comment 2 Notify RN    MRSA PCR SCREENING     Status: Normal   Collection Time   09/22/11   7:14 PM      Component Value Range   MRSA by PCR NEGATIVE  NEGATIVE   GLUCOSE, CAPILLARY     Status: Abnormal   Collection Time   09/22/11  7:31 PM      Component Value Range   Glucose-Capillary 149 (*) 70 - 99 (mg/dL)  BASIC METABOLIC PANEL     Status: Abnormal   Collection Time   09/22/11  7:57 PM      Component Value Range   Sodium 133 (*) 135 - 145 (mEq/L)   Potassium 3.7  3.5 - 5.1 (mEq/L)   Chloride 104  96 - 112 (mEq/L)   CO2 18 (*) 19 - 32 (mEq/L)   Glucose, Bld 149 (*) 70 - 99 (mg/dL)   BUN 15  6 - 23 (mg/dL)   Creatinine, Ser 9.56  0.50 - 1.10 (mg/dL)   Calcium 8.2 (*) 8.4 - 10.5 (mg/dL)   GFR calc non Af Amer >90  >90 (mL/min)   GFR calc Af Amer >90  >90 (mL/min)  GLUCOSE, CAPILLARY     Status: Abnormal   Collection Time   09/22/11  8:02 PM      Component Value Range   Glucose-Capillary 145 (*) 70 - 99 (mg/dL)  GLUCOSE, CAPILLARY     Status: Abnormal   Collection Time   09/22/11  9:03 PM      Component Value Range   Glucose-Capillary 167 (*) 70 - 99 (mg/dL)  GLUCOSE, CAPILLARY     Status: Abnormal   Collection Time   09/22/11 10:06 PM  Component Value Range   Glucose-Capillary 121 (*) 70 - 99 (mg/dL)   Comment 1 Documented in Chart     Comment 2 Notify RN    GLUCOSE, CAPILLARY     Status: Abnormal   Collection Time   09/22/11 11:43 PM      Component Value Range   Glucose-Capillary 140 (*) 70 - 99 (mg/dL)   Comment 1 Documented in Chart    GLUCOSE, CAPILLARY     Status: Abnormal   Collection Time   09/23/11 12:47 AM      Component Value Range   Glucose-Capillary 208 (*) 70 - 99 (mg/dL)   Comment 1 Documented in Chart     Comment 2 Notify RN    GLUCOSE, CAPILLARY     Status: Abnormal   Collection Time   09/23/11  1:44 AM      Component Value Range   Glucose-Capillary 190 (*) 70 - 99 (mg/dL)   Comment 1 Documented in Chart     Comment 2 Notify RN    GLUCOSE, CAPILLARY     Status: Abnormal   Collection Time   09/23/11  3:43 AM      Component Value Range    Glucose-Capillary 135 (*) 70 - 99 (mg/dL)   Comment 1 Documented in Chart     Comment 2 Notify RN    GLUCOSE, CAPILLARY     Status: Abnormal   Collection Time   09/23/11  7:45 AM      Component Value Range   Glucose-Capillary 150 (*) 70 - 99 (mg/dL)   Comment 1 Documented in Chart     Comment 2 Notify RN    BLOOD GAS, ARTERIAL     Status: Abnormal   Collection Time   09/23/11  8:00 AM      Component Value Range   FIO2 .21     pH, Arterial 7.365  7.350 - 7.400    pCO2 arterial 35.4  35.0 - 45.0 (mmHg)   pO2, Arterial 87.3  80.0 - 100.0 (mmHg)   Bicarbonate 19.7 (*) 20.0 - 24.0 (mEq/L)   TCO2 18.1  0 - 100 (mmol/L)   Acid-base deficit 4.5 (*) 0.0 - 2.0 (mmol/L)   O2 Saturation 97.5     Patient temperature 98.6     Collection site RIGHT RADIAL     Drawn by COLLECTED BY RT     Sample type ARTERIAL DRAW     Allens test (pass/fail) PASS  PASS   CBC     Status: Abnormal   Collection Time   09/23/11  9:39 AM      Component Value Range   WBC 4.5  4.0 - 10.5 (K/uL)   RBC 3.24 (*) 3.87 - 5.11 (MIL/uL)   Hemoglobin 10.3 (*) 12.0 - 15.0 (g/dL)   HCT 69.6 (*) 29.5 - 46.0 (%)   MCV 92.9  78.0 - 100.0 (fL)   MCH 31.8  26.0 - 34.0 (pg)   MCHC 34.2  30.0 - 36.0 (g/dL)   RDW 28.4  13.2 - 44.0 (%)   Platelets 335  150 - 400 (K/uL)  BASIC METABOLIC PANEL     Status: Abnormal   Collection Time   09/23/11  9:39 AM      Component Value Range   Sodium 133 (*) 135 - 145 (mEq/L)   Potassium 4.2  3.5 - 5.1 (mEq/L)   Chloride 102  96 - 112 (mEq/L)   CO2 18 (*) 19 - 32 (mEq/L)   Glucose, Bld  288 (*) 70 - 99 (mg/dL)   BUN 9  6 - 23 (mg/dL)   Creatinine, Ser 1.61  0.50 - 1.10 (mg/dL)   Calcium 7.9 (*) 8.4 - 10.5 (mg/dL)   GFR calc non Af Amer >90  >90 (mL/min)   GFR calc Af Amer >90  >90 (mL/min)    Micro: Recent Results (from the past 240 hour(s))  MRSA PCR SCREENING     Status: Normal   Collection Time   09/22/11  7:14 PM      Component Value Range Status Comment   MRSA by PCR NEGATIVE   NEGATIVE  Final     Studies/Results: Dg Chest 2 View  09/22/2011  *RADIOLOGY REPORT*  Clinical Data: Chest pain and acute shortness of breath  CHEST - 2 VIEW  Comparison: 12/14/2010  Findings: Slightly shallow inspiration. The heart size and pulmonary vascularity are normal. The lungs appear clear and expanded without focal air space disease or consolidation. No blunting of the costophrenic angles.  No pneumothorax.  No significant change since prior study.  IMPRESSION: No evidence of active pulmonary disease.  Original Report Authenticated By: Marlon Pel, M.D.    Medications: Scheduled Meds:   . enoxaparin  40 mg Subcutaneous Q24H  . potassium chloride  10 mEq Intravenous Q1H  . potassium chloride  10 mEq Intravenous Q1 Hr x 2  . potassium chloride  10 mEq Intravenous Q1H  . potassium chloride  20 mEq Oral BID  . DISCONTD: insulin aspart  0-15 Units Subcutaneous Q4H  . DISCONTD: insulin glargine  15 Units Subcutaneous QHS  . DISCONTD: insulin regular  0-10 Units Intravenous TID WC   Continuous Infusions:   . dextrose 5 % and 0.9% NaCl 200 mL/hr at 09/23/11 0817  . insulin (NOVOLIN-R) infusion    . DISCONTD: sodium chloride Stopped (09/22/11 1244)  . DISCONTD: sodium chloride    . DISCONTD: sodium chloride    . DISCONTD: dextrose 5 % and 0.45% NaCl    . DISCONTD: dextrose 5 % and 0.45% NaCl 200 mL/hr at 09/23/11 0600  . DISCONTD: insulin (NOVOLIN-R) infusion 1.2 Units/hr (09/22/11 2209)   PRN Meds:.dextrose, dextrose, ondansetron, DISCONTD: dextrose   Assessment:   1. DKA will be managed with the DKA protocol. He had mild improvement in her bicarbonate, but it still not optimal. Nothing she was transitioned to subcutaneous Lantus too soon. Electrolytes will be checked q.3-4  h. we will restart the insulin drip and follow DKA protocol.  2. Leukocytosis resolved is probably because of dehydration. She is  afebrile, so we will hold off on any antibiotics for now.  4.  Dyspnea: She is saturating 100% and the lungs are cl ear. This is probably due to the acidosis. We will monitor for now.   5. DVT prophylaxis with heparin.  6. HbA1c was 13.4  7. She is a full code.        LOS: 1 day   [facility] 09/23/2011, 11:35 AM

## 2011-09-24 ENCOUNTER — Inpatient Hospital Stay (HOSPITAL_COMMUNITY): Payer: PRIVATE HEALTH INSURANCE

## 2011-09-24 LAB — COMPREHENSIVE METABOLIC PANEL
ALT: 141 U/L — ABNORMAL HIGH (ref 0–35)
AST: 131 U/L — ABNORMAL HIGH (ref 0–37)
Albumin: 2.6 g/dL — ABNORMAL LOW (ref 3.5–5.2)
CO2: 23 mEq/L (ref 19–32)
Chloride: 102 mEq/L (ref 96–112)
GFR calc non Af Amer: >90 mL/min (ref >90)
Potassium: 3.6 mEq/L (ref 3.5–5.1)
Sodium: 136 mEq/L (ref 135–145)
Total Bilirubin: 0.1 mg/dL — ABNORMAL LOW (ref 0.3–1.2)

## 2011-09-24 LAB — GLUCOSE, CAPILLARY
Glucose-Capillary: 182 mg/dL — ABNORMAL HIGH (ref 70–99)
Glucose-Capillary: 146 mg/dL — ABNORMAL HIGH (ref 70–99)
Glucose-Capillary: 158 mg/dL — ABNORMAL HIGH (ref 70–99)
Glucose-Capillary: 144 mg/dL — ABNORMAL HIGH (ref 70–99)
Glucose-Capillary: 208 mg/dL — ABNORMAL HIGH (ref 70–99)

## 2011-09-24 LAB — BASIC METABOLIC PANEL
CO2: 22 mEq/L (ref 19–32)
Calcium: 8.3 mg/dL — ABNORMAL LOW (ref 8.4–10.5)
GFR calc Af Amer: >90 mL/min (ref >90)
GFR calc non Af Amer: >90 mL/min (ref >90)
Sodium: 134 mEq/L — ABNORMAL LOW (ref 135–145)

## 2011-09-24 MED ORDER — INSULIN GLARGINE 100 UNIT/ML ~~LOC~~ SOLN
25.0000 [IU] | Freq: Every day | SUBCUTANEOUS | Status: DC
  Administered 2011-09-24: 25 [IU] via SUBCUTANEOUS

## 2011-09-24 MED ORDER — LIVING WELL WITH DIABETES BOOK
Freq: Once | Status: AC
  Administered 2011-09-24: 17:00:00
  Filled 2011-09-24: qty 1

## 2011-09-24 MED ORDER — SODIUM CHLORIDE 0.9 % IV SOLN
INTRAVENOUS | Status: DC
  Administered 2011-09-24 – 2011-09-26 (×3): via INTRAVENOUS

## 2011-09-24 MED ORDER — IBUPROFEN 600 MG PO TABS
600.0000 mg | ORAL_TABLET | Freq: Once | ORAL | Status: AC
  Administered 2011-09-24: 600 mg via ORAL
  Filled 2011-09-24 (×2): qty 1

## 2011-09-24 NOTE — Plan of Care (Signed)
Problem: Phase I Progression Outcomes Goal: Other Phase I Outcomes/Goals Outcome: Progressing Taught patient to use the insulin pen.  Demonstrated understanding.

## 2011-09-24 NOTE — Progress Notes (Signed)
Subjective: Feels better today cough is improving  Objective: Vital signs in last 24 hours: Filed Vitals:   09/24/11 0800 09/24/11 0810 09/24/11 1145 09/24/11 1200  BP:  133/91 124/88   Pulse:  87 95   Temp: 98.4 F (36.9 C)   98.5 F (36.9 C)  TempSrc: Oral   Oral  Resp:  18 19   Height:      Weight:      SpO2:  96% 100%     Intake/Output Summary (Last 24 hours) at 09/24/11 1311 Last data filed at 09/24/11 1200  Gross per 24 hour  Intake 3702.13 ml  Output   3826 ml  Net -123.87 ml    Weight change: 0.6 kg (1 lb 5.2 oz)  General: Alert, awake, oriented x3, in no acute distress. HEENT: No bruits, no goiter. Heart: Regular rate and rhythm, without murmurs, rubs, gallops. Lungs: Clear to auscultation bilaterally. Abdomen: Soft, nontender, nondistended, positive bowel sounds. Extremities: No clubbing cyanosis or edema with positive pedal pulses. Neuro: Grossly intact, nonfocal.    Lab Results: Results for orders placed during the hospital encounter of 09/22/11 (from the past 24 hour(s))  GLUCOSE, CAPILLARY     Status: Abnormal   Collection Time   09/23/11  1:50 PM      Component Value Range   Glucose-Capillary 199 (*) 70 - 99 (mg/dL)   Comment 1 Glucose Stabilizer    GLUCOSE, CAPILLARY     Status: Abnormal   Collection Time   09/23/11  2:52 PM      Component Value Range   Glucose-Capillary 203 (*) 70 - 99 (mg/dL)   Comment 1 Documented in Chart     Comment 2 Notify RN     Comment 3 Glucose Stabilizer    BASIC METABOLIC PANEL     Status: Abnormal   Collection Time   09/23/11  3:35 PM      Component Value Range   Sodium 136  135 - 145 (mEq/L)   Potassium 3.3 (*) 3.5 - 5.1 (mEq/L)   Chloride 103  96 - 112 (mEq/L)   CO2 20  19 - 32 (mEq/L)   Glucose, Bld 161 (*) 70 - 99 (mg/dL)   BUN 10  6 - 23 (mg/dL)   Creatinine, Ser 4.09 (*) 0.50 - 1.10 (mg/dL)   Calcium 8.2 (*) 8.4 - 10.5 (mg/dL)   GFR calc non Af Amer >90  >90 (mL/min)   GFR calc Af Amer >90  >90 (mL/min)    GLUCOSE, CAPILLARY     Status: Abnormal   Collection Time   09/23/11  3:57 PM      Component Value Range   Glucose-Capillary 150 (*) 70 - 99 (mg/dL)   Comment 1 Documented in Chart     Comment 2 Notify RN     Comment 3 Glucose Stabilizer    BASIC METABOLIC PANEL     Status: Abnormal   Collection Time   09/23/11  5:00 PM      Component Value Range   Sodium 137  135 - 145 (mEq/L)   Potassium 3.7  3.5 - 5.1 (mEq/L)   Chloride 104  96 - 112 (mEq/L)   CO2 21  19 - 32 (mEq/L)   Glucose, Bld 147 (*) 70 - 99 (mg/dL)   BUN 10  6 - 23 (mg/dL)   Creatinine, Ser 8.11 (*) 0.50 - 1.10 (mg/dL)   Calcium 8.2 (*) 8.4 - 10.5 (mg/dL)   GFR calc non Af Amer >90  >  90 (mL/min)   GFR calc Af Amer >90  >90 (mL/min)  GLUCOSE, CAPILLARY     Status: Abnormal   Collection Time   09/23/11  5:03 PM      Component Value Range   Glucose-Capillary 142 (*) 70 - 99 (mg/dL)   Comment 1 Documented in Chart     Comment 2 Notify RN     Comment 3 Glucose Stabilizer    GLUCOSE, CAPILLARY     Status: Abnormal   Collection Time   09/23/11  6:17 PM      Component Value Range   Glucose-Capillary 118 (*) 70 - 99 (mg/dL)   Comment 1 Documented in Chart     Comment 2 Notify RN     Comment 3 Glucose Stabilizer    GLUCOSE, CAPILLARY     Status: Abnormal   Collection Time   09/23/11  7:12 PM      Component Value Range   Glucose-Capillary 144 (*) 70 - 99 (mg/dL)   Comment 1 Glucose Stabilizer    GLUCOSE, CAPILLARY     Status: Abnormal   Collection Time   09/23/11  8:14 PM      Component Value Range   Glucose-Capillary 139 (*) 70 - 99 (mg/dL)   Comment 1 Documented in Chart     Comment 2 Notify RN    GLUCOSE, CAPILLARY     Status: Abnormal   Collection Time   09/23/11  9:01 PM      Component Value Range   Glucose-Capillary 132 (*) 70 - 99 (mg/dL)   Comment 1 Documented in Chart     Comment 2 Notify RN    GLUCOSE, CAPILLARY     Status: Abnormal   Collection Time   09/23/11 10:13 PM      Component Value Range    Glucose-Capillary 152 (*) 70 - 99 (mg/dL)  GLUCOSE, CAPILLARY     Status: Abnormal   Collection Time   09/23/11 11:08 PM      Component Value Range   Glucose-Capillary 120 (*) 70 - 99 (mg/dL)  BASIC METABOLIC PANEL     Status: Abnormal   Collection Time   09/23/11 11:24 PM      Component Value Range   Sodium 134 (*) 135 - 145 (mEq/L)   Potassium 3.6  3.5 - 5.1 (mEq/L)   Chloride 101  96 - 112 (mEq/L)   CO2 22  19 - 32 (mEq/L)   Glucose, Bld 118 (*) 70 - 99 (mg/dL)   BUN 14  6 - 23 (mg/dL)   Creatinine, Ser 0.98  0.50 - 1.10 (mg/dL)   Calcium 8.3 (*) 8.4 - 10.5 (mg/dL)   GFR calc non Af Amer >90  >90 (mL/min)   GFR calc Af Amer >90  >90 (mL/min)  GLUCOSE, CAPILLARY     Status: Abnormal   Collection Time   09/23/11 11:54 PM      Component Value Range   Glucose-Capillary 128 (*) 70 - 99 (mg/dL)   Comment 1 Documented in Chart     Comment 2 Notify RN    GLUCOSE, CAPILLARY     Status: Abnormal   Collection Time   09/24/11  1:07 AM      Component Value Range   Glucose-Capillary 182 (*) 70 - 99 (mg/dL)  COMPREHENSIVE METABOLIC PANEL     Status: Abnormal   Collection Time   09/24/11  3:33 AM      Component Value Range   Sodium 136  135 -  145 (mEq/L)   Potassium 3.6  3.5 - 5.1 (mEq/L)   Chloride 102  96 - 112 (mEq/L)   CO2 23  19 - 32 (mEq/L)   Glucose, Bld 162 (*) 70 - 99 (mg/dL)   BUN 13  6 - 23 (mg/dL)   Creatinine, Ser 1.61  0.50 - 1.10 (mg/dL)   Calcium 8.5  8.4 - 09.6 (mg/dL)   Total Protein 5.8 (*) 6.0 - 8.3 (g/dL)   Albumin 2.6 (*) 3.5 - 5.2 (g/dL)   AST 045 (*) 0 - 37 (U/L)   ALT 141 (*) 0 - 35 (U/L)   Alkaline Phosphatase 112  39 - 117 (U/L)   Total Bilirubin 0.1 (*) 0.3 - 1.2 (mg/dL)   GFR calc non Af Amer >90  >90 (mL/min)   GFR calc Af Amer >90  >90 (mL/min)  GLUCOSE, CAPILLARY     Status: Abnormal   Collection Time   09/24/11  7:36 AM      Component Value Range   Glucose-Capillary 146 (*) 70 - 99 (mg/dL)   Comment 1 Documented in Chart     Comment 2 Notify RN      CARDIAC PANEL(CRET KIN+CKTOT+MB+TROPI)     Status: Normal   Collection Time   09/24/11  9:29 AM      Component Value Range   Total CK 52  7 - 177 (U/L)   CK, MB 1.9  0.3 - 4.0 (ng/mL)   Troponin I <0.30  <0.30 (ng/mL)   Relative Index RELATIVE INDEX IS INVALID  0.0 - 2.5   GLUCOSE, CAPILLARY     Status: Abnormal   Collection Time   09/24/11 11:32 AM      Component Value Range   Glucose-Capillary 200 (*) 70 - 99 (mg/dL)   Comment 1 Documented in Chart     Comment 2 Notify RN       Micro: Recent Results (from the past 240 hour(s))  MRSA PCR SCREENING     Status: Normal   Collection Time   09/22/11  7:14 PM      Component Value Range Status Comment   MRSA by PCR NEGATIVE  NEGATIVE  Final     Studies/Results: Dg Chest Port 1 View  09/23/2011  *RADIOLOGY REPORT*  Clinical Data: Chest tightness, evaluate for pneumonia  PORTABLE CHEST - 1 VIEW  Comparison: 09/22/2011  Findings: Lungs are essentially clear. No pleural effusion or pneumothorax.  Cardiomediastinal silhouette is within normal limits.  IMPRESSION: No evidence of acute cardiopulmonary disease.  Original Report Authenticated By: Charline Bills, M.D.    Medications:  Scheduled Meds:   . enoxaparin  40 mg Subcutaneous Q24H  . fluconazole  150 mg Oral Daily  . insulin aspart  0-15 Units Subcutaneous TID WC  . insulin aspart  0-5 Units Subcutaneous QHS  . insulin aspart  4 Units Subcutaneous TID WC  . insulin glargine  25 Units Subcutaneous QHS  . potassium chloride  10 mEq Intravenous Q1 Hr x 4  . potassium chloride  20 mEq Oral BID  . DISCONTD: insulin glargine  20 Units Subcutaneous QHS   Continuous Infusions:   . sodium chloride    . dextrose 5 % and 0.9% NaCl 200 mL/hr at 09/24/11 1152  . DISCONTD: insulin (NOVOLIN-R) infusion 2.5 Units/hr (09/23/11 1915)   PRN Meds:.dextrose, dextrose, guaiFENesin, ondansetron   Assessment:  1. DKA now seems to have resolved. She has been transitioned to Lantus and sliding  scale insulin. 2. Leukocytosis resolved is probably  because of dehydration. She is  afebrile, so we will hold off on any antibiotics for now.  4. Dyspnea: She is saturating 100% and the lungs are cl  ear. This is probably due to the acidosis. Chest x-ray has been negative x2. We will monitor for now.  5. DVT prophylaxis with heparin.  6. HbA1c was 13.4  7. She is a full code.  #8 transaminitis the patient will have a right upper quadrant ultrasound today. 2 rule out cholecystitis    Disposition anticipate discharge tomorrow      LOS: 2 days   [name] 09/24/2011, 1:11 PM

## 2011-09-25 LAB — COMPREHENSIVE METABOLIC PANEL
Alkaline Phosphatase: 109 U/L (ref 39–117)
BUN: 15 mg/dL (ref 6–23)
Calcium: 8.6 mg/dL (ref 8.4–10.5)
GFR calc Af Amer: >90 mL/min (ref >90)
Glucose, Bld: 365 mg/dL — ABNORMAL HIGH (ref 70–99)
Potassium: 4 mEq/L (ref 3.5–5.1)
Total Protein: 5.6 g/dL — ABNORMAL LOW (ref 6.0–8.3)

## 2011-09-25 LAB — BLOOD GAS, VENOUS
Bicarbonate: 8.2 mEq/L — ABNORMAL LOW (ref 20.0–24.0)
FIO2: 0.21 %
O2 Saturation: 33 %
Patient temperature: 98.6
TCO2: 8.1 mmol/L (ref 0–100)

## 2011-09-25 LAB — BLOOD GAS, ARTERIAL
Bicarbonate: 14.2 mEq/L — ABNORMAL LOW (ref 20.0–24.0)
FIO2: 0.21 %
O2 Saturation: 97.5 %
Patient temperature: 98.6
pH, Arterial: 7.306 — ABNORMAL LOW (ref 7.350–7.400)

## 2011-09-25 LAB — GLUCOSE, CAPILLARY: Glucose-Capillary: 323 mg/dL — ABNORMAL HIGH (ref 70–99)

## 2011-09-25 MED ORDER — INSULIN ASPART 100 UNIT/ML ~~LOC~~ SOLN
15.0000 [IU] | Freq: Once | SUBCUTANEOUS | Status: AC
  Administered 2011-09-25: 15 [IU] via SUBCUTANEOUS

## 2011-09-25 MED ORDER — INSULIN GLARGINE 100 UNIT/ML ~~LOC~~ SOLN
40.0000 [IU] | Freq: Every day | SUBCUTANEOUS | Status: DC
  Administered 2011-09-25: 40 [IU] via SUBCUTANEOUS

## 2011-09-25 MED ORDER — INSULIN GLARGINE 100 UNIT/ML ~~LOC~~ SOLN
15.0000 [IU] | Freq: Once | SUBCUTANEOUS | Status: AC
  Administered 2011-09-25: 15 [IU] via SUBCUTANEOUS

## 2011-09-25 MED ORDER — SODIUM CHLORIDE 0.9 % IV SOLN
Freq: Once | INTRAVENOUS | Status: AC
  Administered 2011-09-25: 03:00:00 via INTRAVENOUS

## 2011-09-25 NOTE — Progress Notes (Signed)
Subjective: [name] G. is elevated this morning, 455  Objective: Vital signs in last 24 hours: Filed Vitals:   09/24/11 2200 09/25/11 0000 09/25/11 0250 09/25/11 0322  BP: 127/84  120/85 125/89  Pulse: 90   79  Temp:  98.3 F (36.8 C)  98.1 F (36.7 C)  TempSrc:  Oral  Oral  Resp: 24   16  Height:      Weight:      SpO2:    98%    Intake/Output Summary (Last 24 hours) at 09/25/11 0831 Last data filed at 09/25/11 0300  Gross per 24 hour  Intake   2965 ml  Output   3076 ml  Net   -111 ml    Weight change:  General: Alert, awake, oriented x3, in no acute distress. HEENT: No bruits, no goiter. Heart: Regular rate and rhythm, without murmurs, rubs, gallops. Lungs: Clear to auscultation bilaterally. Abdomen: Soft, nontender, nondistended, positive bowel sounds. Extremities: No clubbing cyanosis or edema with positive pedal pulses. Neuro: Grossly intact, nonfocal.     Lab Results: Results for orders placed during the hospital encounter of 09/22/11 (from the past 24 hour(s))  CARDIAC PANEL(CRET KIN+CKTOT+MB+TROPI)     Status: Normal   Collection Time   09/24/11  9:29 AM      Component Value Range   Total CK 52  7 - 177 (U/L)   CK, MB 1.9  0.3 - 4.0 (ng/mL)   Troponin I <0.30  <0.30 (ng/mL)   Relative Index RELATIVE INDEX IS INVALID  0.0 - 2.5   GLUCOSE, CAPILLARY     Status: Abnormal   Collection Time   09/24/11 11:32 AM      Component Value Range   Glucose-Capillary 200 (*) 70 - 99 (mg/dL)   Comment 1 Documented in Chart     Comment 2 Notify RN    GLUCOSE, CAPILLARY     Status: Abnormal   Collection Time   09/24/11  2:47 PM      Component Value Range   Glucose-Capillary 170 (*) 70 - 99 (mg/dL)   Comment 1 PATIENT REQUEST.     GLUCOSE, CAPILLARY     Status: Abnormal   Collection Time   09/24/11  4:09 PM      Component Value Range   Glucose-Capillary 158 (*) 70 - 99 (mg/dL)   Comment 1 Documented in Chart     Comment 2 Notify RN    GLUCOSE, CAPILLARY     Status:  Abnormal   Collection Time   09/24/11  7:50 PM      Component Value Range   Glucose-Capillary 144 (*) 70 - 99 (mg/dL)   Comment 1 Documented in Chart     Comment 2 Notify RN    GLUCOSE, CAPILLARY     Status: Abnormal   Collection Time   09/24/11  9:58 PM      Component Value Range   Glucose-Capillary 208 (*) 70 - 99 (mg/dL)   Comment 1 Documented in Chart     Comment 2 Notify RN    COMPREHENSIVE METABOLIC PANEL     Status: Abnormal   Collection Time   09/25/11  3:25 AM      Component Value Range   Sodium 133 (*) 135 - 145 (mEq/L)   Potassium 4.0  3.5 - 5.1 (mEq/L)   Chloride 98  96 - 112 (mEq/L)   CO2 24  19 - 32 (mEq/L)   Glucose, Bld 365 (*) 70 - 99 (mg/dL)  BUN 15  6 - 23 (mg/dL)   Creatinine, Ser 4.54  0.50 - 1.10 (mg/dL)   Calcium 8.6  8.4 - 09.8 (mg/dL)   Total Protein 5.6 (*) 6.0 - 8.3 (g/dL)   Albumin 2.5 (*) 3.5 - 5.2 (g/dL)   AST 119 (*) 0 - 37 (U/L)   ALT 152 (*) 0 - 35 (U/L)   Alkaline Phosphatase 109  39 - 117 (U/L)   Total Bilirubin 0.2 (*) 0.3 - 1.2 (mg/dL)   GFR calc non Af Amer >90  >90 (mL/min)   GFR calc Af Amer >90  >90 (mL/min)  GLUCOSE, CAPILLARY     Status: Abnormal   Collection Time   09/25/11  3:28 AM      Component Value Range   Glucose-Capillary 323 (*) 70 - 99 (mg/dL)   Comment 1 Notify RN       Micro: Recent Results (from the past 240 hour(s))  MRSA PCR SCREENING     Status: Normal   Collection Time   09/22/11  7:14 PM      Component Value Range Status Comment   MRSA by PCR NEGATIVE  NEGATIVE  Final     Studies/Results: US Abdomen Complete  09/24/2011  *RADIOLOGY REPORT*  Clinical Data:  Abdominal pain.  COMPLETE ABDOMINAL ULTRASOUND 09/24/2011:  Comparison:  None.  Findings:  Gallbladder:  No shadowing gallstones or echogenic sludge.  No gallbladder wall thickening or pericholecystic fluid.  Negative sonographic Murphy's sign according to the ultrasound technologist.  Common bile duct:  Normal in caliber with maximum diameter approximating 4  mm.  Liver:  Diffusely increased and coarsened echotexture without focal parenchymal abnormality.  Patent portal vein with hepatopetal flow.  IVC:  Patent.  Pancreas:  Although the pancreas is difficult to visualize in its entirety, no focal pancreatic abnormality is identified.  Spleen:  Normal size and echotexture without focal parenchymal abnormality.  Right Kidney:  No hydronephrosis.  Well-preserved cortex.  No shadowing calculi.  Normal size and parenchymal echotexture without focal abnormalities.  Approximately 12.9 cm in length.  Left Kidney:  No hydronephrosis.  Well-preserved cortex.  No shadowing calculi.  Normal size and parenchymal echotexture without focal abnormalities.  Approximately 13.0 cm in length.  Abdominal aorta:  Normal in caliber throughout its visualized course in the abdomen without significant atherosclerosis.  IMPRESSION:  1.  Diffuse hepatic steatosis without focal hepatic parenchymal abnormality. 2.  Otherwise normal examination.  Original Report Authenticated By: Arnell Sieving, M.D.   Dg Chest Port 1 View  09/23/2011  *RADIOLOGY REPORT*  Clinical Data: Chest tightness, evaluate for pneumonia  PORTABLE CHEST - 1 VIEW  Comparison: 09/22/2011  Findings: Lungs are essentially clear. No pleural effusion or pneumothorax.  Cardiomediastinal silhouette is within normal limits.  IMPRESSION: No evidence of acute cardiopulmonary disease.  Original Report Authenticated By: Charline Bills, M.D.    Medications:  Scheduled Meds:   . sodium chloride   Intravenous Once  . enoxaparin  40 mg Subcutaneous Q24H  . ibuprofen  600 mg Oral Once  . insulin aspart  0-15 Units Subcutaneous TID WC  . insulin aspart  0-5 Units Subcutaneous QHS  . insulin aspart  4 Units Subcutaneous TID WC  . insulin glargine  15 Units Subcutaneous Once  . insulin glargine  40 Units Subcutaneous QHS  . living well with diabetes book   Does not apply Once  . potassium chloride  20 mEq Oral BID  . DISCONTD:  fluconazole  150 mg Oral Daily  .  DISCONTD: insulin glargine  20 Units Subcutaneous QHS  . DISCONTD: insulin glargine  25 Units Subcutaneous QHS   Continuous Infusions:   . sodium chloride 75 mL/hr at 09/24/11 1321  . DISCONTD: dextrose 5 % and 0.9% NaCl 200 mL/hr at 09/24/11 1152   PRN Meds:.dextrose, dextrose, guaiFENesin, ondansetron   Assessment:   1. DKA now seems to have resolved. However her CBGs are still elevated. She received 25 units of Lantus at bedtime yesterday. The dose of her Lantus needs to be increased to 40 units. We will give her an extra 15 units this morning. She will receive 15 units based on a sliding scale. We will change her CBG monitoring 2 every 4 hours.    2. Leukocytosis resolved is probably because of dehydration. She is  afebrile, so we will hold off on any antibiotics for now.   4. Dyspnea: She is saturating 100% and the lungs are clear. This is probably due to the acidosis. Chest x-ray has been negative x2. We will monitor for now.   5. DVT prophylaxis with heparin.  6. HbA1c was 13.4  7. She is a full code.  #8 transaminitis right upper quadrant ultrasound shows hepatic steatosis.  Disposition we are still trying to regulate her CBGs. Anticipate discharge tomorrow morning.     LOS: 3 days   [facility] 09/25/2011, 8:31 AM

## 2011-09-25 NOTE — Progress Notes (Signed)
Spoke with patient at bedside. States she has insurance coverage to assist with Lantus. Patient states she was unable to obtain Lantus because she did not have a script, she could not get the script because she had not been to her endocrinologist, the endocrinologist would not see her until she had paid her bill at the office. Patient requested to be sent home with a vial of Lantus at d/c, we are unable to assist with indigent meds since patient has insurance to cover meds. Patient states she will be able to see her endocrinologist at d/c because her mother has agreed to pay the patients bill. Patient states no other needs currently, has all other diabetic supplies.

## 2011-09-25 NOTE — Progress Notes (Addendum)
Inpatient Diabetes Program Recommendations  AACE/ADA: New Consensus Statement on Inpatient Glycemic Control (2009)  Target Ranges:  Prepandial:   less than 140 mg/dL      Peak postprandial:   less than 180 mg/dL (1-2 hours)      Critically ill patients:  140 - 180 mg/dL   Reason for Visit: Hyperglycemia with type 1 diabetes  Inpatient Diabetes Program Recommendations Insulin - Basal: Pt with type 1 dm per Dr. Sharl Ma.  Basal needs should not exeed 35 units Lantus Correction (SSI): Use sensitive correction tidwc and the HS correction scale. Insulin - Meal Coverage: Would start with 6 units tidwc.(may need as much as 10 units tidwc (takes 25 with each meal at home, but may have been due to lack of basal insulin availability)  Pt may go home with her insulin pens at discharge that are used here for her specifically.  Will only need script for needles to fit the pen.  There are 1500 units of insulin in each pen which should last her plenty of time until she can get a prescription. Actually prescription can be written here at discharge.  Pt has Lantus assistance.  She can get Apidra (like Novolog) free of charge as well through the same program as Lantus ([employer]).   Note: Thank you. Lenor Coffin, RN, CNS [phone])

## 2011-09-25 NOTE — Progress Notes (Signed)
CSW received referral that patient is interested in applying for Medicaid benefits. Noted patient has [employer], CSW advised patient to go to Department of Social Services to apply for Medicaid in person once discharged from the hospital. Patient stated she knew where DSS is located and will apply. CSW signing off.   Unice Bailey, LCSWA [phone]

## 2011-09-25 NOTE — Progress Notes (Signed)
UR complete 

## 2011-09-26 LAB — BASIC METABOLIC PANEL
CO2: 25 mEq/L (ref 19–32)
Chloride: 98 mEq/L (ref 96–112)
Creatinine, Ser: 0.5 mg/dL (ref 0.50–1.10)
Potassium: 3.6 mEq/L (ref 3.5–5.1)

## 2011-09-26 MED ORDER — INSULIN GLARGINE 100 UNIT/ML ~~LOC~~ SOLN: 45.0000 [IU] | Freq: Every day | SUBCUTANEOUS | Status: DC

## 2011-09-26 MED ORDER — GUAIFENESIN 100 MG/5ML PO SOLN: 5.0000 mL | Freq: Four times a day (QID) | ORAL | Status: DC | PRN

## 2011-09-26 MED ORDER — INSULIN ASPART 100 UNIT/ML ~~LOC~~ SOLN: 10.0000 [IU] | Freq: Three times a day (TID) | SUBCUTANEOUS | Status: DC

## 2011-09-26 NOTE — Progress Notes (Signed)
Patient d/c home, ambubatory stable. Mother came to picked her up.

## 2011-09-26 NOTE — Discharge Summary (Signed)
Physician Discharge Summary  MUNIRAH DOERNER MRN: [id number] DOB/AGE: 12-13-1980 31 y.o.  PCP: Talmage Coin, MD, MD   Admit date: 09/22/2011 Discharge date: 09/26/2011  Discharge Diagnoses:  Diabetic ketoacidosis  *Hyponatremia Transaminitis    Hyperkalemia   Current Discharge Medication List    START taking these medications   Details  guaiFENesin (ROBITUSSIN) 100 MG/5ML SOLN Take 5 mLs (100 mg total) by mouth every 6 (six) hours as needed. Qty: 1200 mL, Refills: 0      CONTINUE these medications which have CHANGED   Details  insulin aspart (NOVOLOG) 100 UNIT/ML injection Inject 10 Units into the skin 3 (three) times daily before meals. 1 unit for every 4 gm carb Qty: 1000 vial, Refills: 6    insulin glargine (LANTUS) 100 UNIT/ML injection Inject 45 Units into the skin at bedtime. Qty: 1000 mL, Refills: 2      CONTINUE these medications which have NOT CHANGED   Details  Multiple Vitamin (MULTIVITAMIN) tablet Take 1 tablet by mouth daily.          Discharge Condition: Stable Disposition: Home or Self Care   Consults: None  Significant Diagnostic Studies: Dg Chest 2 View  09/22/2011  *RADIOLOGY REPORT*  Clinical Data: Chest pain and acute shortness of breath  CHEST - 2 VIEW  Comparison: 12/14/2010  Findings: Slightly shallow inspiration. The heart size and pulmonary vascularity are normal. The lungs appear clear and expanded without focal air space disease or consolidation. No blunting of the costophrenic angles.  No pneumothorax.  No significant change since prior study.  IMPRESSION: No evidence of active pulmonary disease.  Original Report Authenticated By: Marlon Pel, M.D.   US Abdomen Complete  09/24/2011  *RADIOLOGY REPORT*  Clinical Data:  Abdominal pain.  COMPLETE ABDOMINAL ULTRASOUND 09/24/2011:  Comparison:  None.  Findings:  Gallbladder:  No shadowing gallstones or echogenic sludge.  No gallbladder wall thickening or pericholecystic fluid.   Negative sonographic Murphy's sign according to the ultrasound technologist.  Common bile duct:  Normal in caliber with maximum diameter approximating 4 mm.  Liver:  Diffusely increased and coarsened echotexture without focal parenchymal abnormality.  Patent portal vein with hepatopetal flow.  IVC:  Patent.  Pancreas:  Although the pancreas is difficult to visualize in its entirety, no focal pancreatic abnormality is identified.  Spleen:  Normal size and echotexture without focal parenchymal abnormality.  Right Kidney:  No hydronephrosis.  Well-preserved cortex.  No shadowing calculi.  Normal size and parenchymal echotexture without focal abnormalities.  Approximately 12.9 cm in length.  Left Kidney:  No hydronephrosis.  Well-preserved cortex.  No shadowing calculi.  Normal size and parenchymal echotexture without focal abnormalities.  Approximately 13.0 cm in length.  Abdominal aorta:  Normal in caliber throughout its visualized course in the abdomen without significant atherosclerosis.  IMPRESSION:  1.  Diffuse hepatic steatosis without focal hepatic parenchymal abnormality. 2.  Otherwise normal examination.  Original Report Authenticated By: Arnell Sieving, M.D.   Dg Chest Port 1 View  09/23/2011  *RADIOLOGY REPORT*  Clinical Data: Chest tightness, evaluate for pneumonia  PORTABLE CHEST - 1 VIEW  Comparison: 09/22/2011  Findings: Lungs are essentially clear. No pleural effusion or pneumothorax.  Cardiomediastinal silhouette is within normal limits.  IMPRESSION: No evidence of acute cardiopulmonary disease.  Original Report Authenticated By: Charline Bills, M.D.      Microbiology: Recent Results (from the past 240 hour(s))  MRSA PCR SCREENING     Status: Normal   Collection Time  09/22/11  7:14 PM      Component Value Range Status Comment   MRSA by PCR NEGATIVE  NEGATIVE  Final      Labs: Results for orders placed during the hospital encounter of 09/22/11 (from the past 48 hour(s))  GLUCOSE,  CAPILLARY     Status: Abnormal   Collection Time   09/24/11 11:32 AM      Component Value Range Comment   Glucose-Capillary 200 (*) 70 - 99 (mg/dL)    Comment 1 Documented in Chart      Comment 2 Notify RN     GLUCOSE, CAPILLARY     Status: Abnormal   Collection Time   09/24/11  2:47 PM      Component Value Range Comment   Glucose-Capillary 170 (*) 70 - 99 (mg/dL)    Comment 1 PATIENT REQUEST.      GLUCOSE, CAPILLARY     Status: Abnormal   Collection Time   09/24/11  4:09 PM      Component Value Range Comment   Glucose-Capillary 158 (*) 70 - 99 (mg/dL)    Comment 1 Documented in Chart      Comment 2 Notify RN     GLUCOSE, CAPILLARY     Status: Abnormal   Collection Time   09/24/11  7:50 PM      Component Value Range Comment   Glucose-Capillary 144 (*) 70 - 99 (mg/dL)    Comment 1 Documented in Chart      Comment 2 Notify RN     GLUCOSE, CAPILLARY     Status: Abnormal   Collection Time   09/24/11  9:58 PM      Component Value Range Comment   Glucose-Capillary 208 (*) 70 - 99 (mg/dL)    Comment 1 Documented in Chart      Comment 2 Notify RN     COMPREHENSIVE METABOLIC PANEL     Status: Abnormal   Collection Time   09/25/11  3:25 AM      Component Value Range Comment   Sodium 133 (*) 135 - 145 (mEq/L)    Potassium 4.0  3.5 - 5.1 (mEq/L)    Chloride 98  96 - 112 (mEq/L)    CO2 24  19 - 32 (mEq/L)    Glucose, Bld 365 (*) 70 - 99 (mg/dL)    BUN 15  6 - 23 (mg/dL)    Creatinine, Ser 1.61  0.50 - 1.10 (mg/dL)    Calcium 8.6  8.4 - 10.5 (mg/dL)    Total Protein 5.6 (*) 6.0 - 8.3 (g/dL)    Albumin 2.5 (*) 3.5 - 5.2 (g/dL)    AST 096 (*) 0 - 37 (U/L)    ALT 152 (*) 0 - 35 (U/L)    Alkaline Phosphatase 109  39 - 117 (U/L)    Total Bilirubin 0.2 (*) 0.3 - 1.2 (mg/dL)    GFR calc non Af Amer >90  >90 (mL/min)    GFR calc Af Amer >90  >90 (mL/min)   GLUCOSE, CAPILLARY     Status: Abnormal   Collection Time   09/25/11  3:28 AM      Component Value Range Comment   Glucose-Capillary 323 (*)  70 - 99 (mg/dL)    Comment 1 Notify RN     BASIC METABOLIC PANEL     Status: Abnormal   Collection Time   09/26/11  3:25 AM      Component Value Range Comment   Sodium 136  135 - 145 (mEq/L)    Potassium 3.6  3.5 - 5.1 (mEq/L)    Chloride 98  96 - 112 (mEq/L)    CO2 25  19 - 32 (mEq/L)    Glucose, Bld 135 (*) 70 - 99 (mg/dL)    BUN 15  6 - 23 (mg/dL)    Creatinine, Ser 0.98  0.50 - 1.10 (mg/dL)    Calcium 9.1  8.4 - 10.5 (mg/dL)    GFR calc non Af Amer >90  >90 (mL/min)    GFR calc Af Amer >90  >90 (mL/min)      HPI The patient is a 31 year old Caucasian female who has a past medical history of diabetes, which is type 1, that she has had about 7 years. She has had elevated CBGs for the last 6 hours. Over the past month or so she has been having problems with her Lantus. She is not filling her prescription because of cost issues.Patient woke from sleep short of breath checked her blood sugar and was elevated she presents here. History of type 1 diabetes and she is on a sliding scale insulin. She is out of her long acting insulin. Patient had emesis x1, denies any diarrhea or fever. No cough or chest pain noted. Nothing makes her symptoms better or worse   . Denies any sick contacts at home or  elsewhere. Since the symptoms did not improve, she decided to come into  the hospital for further evaluation.    HOSPITAL COURSE:  #1 DKA the patient was hospitalized and treated for DKA protocol in ICU, she required repletion of her potassium. She was transitioned to Lantus and sliding scale insulin with Q4 hour CBG monitoring. She has been very labile during this admission as far as her glucose control is concerned. We have strongly emphasized the need for being compliant. She had a case Production designer, theatre/television/film and social work consultation as well, to help her with her financial needs. I am going to refill her prescriptions with 2 refills and each of her short-acting and long-acting insulin. A full infectious workup  was done and was found to be negative. No obvious precipitating cause of her DKA was found other than noncompliance with her Lantus and her sliding scale insulin   #2 transaminitis the patient denied any abdominal symptoms, she had an ultrasound of the right upper quadrant that showed diffuse hepatic steatosis  Discharge Exam:  Blood pressure 120/80, pulse 73, temperature 97.9 F (36.6 C), temperature source Oral, resp. rate 18, height 5\' 4"  (1.626 m), weight 71.9 kg (158 lb 8.2 oz), last menstrual period 09/05/2011, SpO2 99.00%.   General: Alert, awake, oriented x3, in no acute distress. HEENT: No bruits, no goiter. Heart: Regular rate and rhythm, without murmurs, rubs, gallops. Lungs: Clear to auscultation bilaterally. Abdomen: Soft, nontender, nondistended, positive bowel sounds. Extremities: No clubbing cyanosis or edema with positive pedal pulses. Neuro: Grossly intact, nonfocal.    Discharge Orders    Future Orders Please Complete By Expires   Diet - low sodium heart healthy      Increase activity slowly      Call MD for:  temperature >100.4      Call MD for:  persistant nausea and vomiting      Call MD for:  difficulty breathing, headache or visual disturbances         Follow-up Information    Follow up with KERR,JEFFREY, MD .         SignedRicharda Overlie 09/26/2011, 9:57 AM

## 2011-09-26 NOTE — Progress Notes (Signed)
Patient d/c instructions given verbalized understanding,,prescriptions given, care notes re: DKA provided, stable, no c/o pain, mother present  accompany her home. Alert and oriented x4.

## 2011-09-27 LAB — GLUCOSE, CAPILLARY
Glucose-Capillary: 209 mg/dL — ABNORMAL HIGH (ref 70–99)
Glucose-Capillary: 134 mg/dL — ABNORMAL HIGH (ref 70–99)
Glucose-Capillary: 334 mg/dL — ABNORMAL HIGH (ref 70–99)
Glucose-Capillary: 226 mg/dL — ABNORMAL HIGH (ref 70–99)
Glucose-Capillary: 73 mg/dL (ref 70–99)
Glucose-Capillary: 241 mg/dL — ABNORMAL HIGH (ref 70–99)

## 2011-10-17 ENCOUNTER — Ambulatory Visit: Payer: PRIVATE HEALTH INSURANCE | Admitting: Family Medicine

## 2011-10-17 DIAGNOSIS — Z0289 Encounter for other administrative examinations: Secondary | ICD-10-CM

## 2011-11-16 ENCOUNTER — Ambulatory Visit: Payer: PRIVATE HEALTH INSURANCE | Admitting: Endocrinology

## 2011-12-19 ENCOUNTER — Emergency Department (HOSPITAL_COMMUNITY)
Admission: EM | Admit: 2011-12-19 | Discharge: 2011-12-19 | Disposition: A | Discharge status: Home / Self Care | Payer: PRIVATE HEALTH INSURANCE | Attending: Emergency Medicine | Admitting: Emergency Medicine

## 2011-12-19 ENCOUNTER — Encounter (HOSPITAL_COMMUNITY): Payer: Self-pay

## 2011-12-19 DIAGNOSIS — E119 Type 2 diabetes mellitus without complications: Secondary | ICD-10-CM | POA: Insufficient documentation

## 2011-12-19 DIAGNOSIS — Z794 Long term (current) use of insulin: Secondary | ICD-10-CM | POA: Insufficient documentation

## 2011-12-19 DIAGNOSIS — R112 Nausea with vomiting, unspecified: Secondary | ICD-10-CM | POA: Insufficient documentation

## 2011-12-19 DIAGNOSIS — R000 Tachycardia, unspecified: Secondary | ICD-10-CM | POA: Insufficient documentation

## 2011-12-19 DIAGNOSIS — R739 Hyperglycemia, unspecified: Secondary | ICD-10-CM

## 2011-12-19 LAB — URINE MICROSCOPIC-ADD ON

## 2011-12-19 LAB — DIFFERENTIAL
Eosinophils Absolute: 0.1 10*3/uL (ref 0.0–0.7)
Eosinophils Relative: 2 % (ref 0–5)
Lymphocytes Relative: 35 % (ref 12–46)
Lymphs Abs: 2.4 10*3/uL (ref 0.7–4.0)
Monocytes Absolute: 0.6 10*3/uL (ref 0.1–1.0)

## 2011-12-19 LAB — URINALYSIS, ROUTINE W REFLEX MICROSCOPIC
Bilirubin Urine: NEGATIVE
Glucose, UA: >1000 mg/dL — AB
Hgb urine dipstick: NEGATIVE
Ketones, ur: 40 mg/dL — AB
Protein, ur: NEGATIVE mg/dL
pH: 5 (ref 5.0–8.0)

## 2011-12-19 LAB — CBC
HCT: 38.8 % (ref 36.0–46.0)
MCH: 30.4 pg (ref 26.0–34.0)
MCV: 87.4 fL (ref 78.0–100.0)
RBC: 4.44 MIL/uL (ref 3.87–5.11)
WBC: 6.9 10*3/uL (ref 4.0–10.5)

## 2011-12-19 LAB — BASIC METABOLIC PANEL
CO2: 23 mEq/L (ref 19–32)
Calcium: 9.4 mg/dL (ref 8.4–10.5)
Chloride: 99 mEq/L (ref 96–112)
Creatinine, Ser: 0.77 mg/dL (ref 0.50–1.10)
Glucose, Bld: 211 mg/dL — ABNORMAL HIGH (ref 70–99)
Sodium: 135 mEq/L (ref 135–145)

## 2011-12-19 LAB — GLUCOSE, CAPILLARY: Glucose-Capillary: 94 mg/dL (ref 70–99)

## 2011-12-19 LAB — BLOOD GAS, VENOUS
Acid-Base Excess: 3 mmol/L — ABNORMAL HIGH (ref 0.0–2.0)
Bicarbonate: 27.5 mEq/L — ABNORMAL HIGH (ref 20.0–24.0)
TCO2: 24.7 mmol/L (ref 0–100)
pCO2, Ven: 44.1 mmHg — ABNORMAL LOW (ref 45.0–50.0)
pO2, Ven: 29.9 mmHg — CL (ref 30.0–45.0)

## 2011-12-19 MED ORDER — SODIUM CHLORIDE 0.9 % IV BOLUS (SEPSIS)
1000.0000 mL | Freq: Once | INTRAVENOUS | Status: AC
  Administered 2011-12-19: 1000 mL via INTRAVENOUS

## 2011-12-19 NOTE — ED Provider Notes (Signed)
History     CSN: [id number]  Arrival date & time 12/19/11  1751   First MD Initiated Contact with Patient 12/19/11 1909      Chief Complaint  Patient presents with  . Hyperglycemia    (Consider location/radiation/quality/duration/timing/severity/associated sxs/prior treatment) HPI Comments: Patient with a history of type 1 DM presents to the emergency department with chief complaint of hyperglycemia.  Patient reports that she had an abdominal bug associated with vomiting and that her glucose levels have not been able to be maintained since she had a stomach virus.  Patient denies current abdominal pain or emesis.  Patient states concerned because her glucometer has read high more than once this last week, meaning her blood sugars are greater than 600.  Patient controls her diabetes with a sliding scale and has had 60-80 units of NovoLog since 12 o'clock this afternoon do to a recent reading stating her CBG was greater than 600.  Patient states she has not eaten anything after this morning.  Patient does have a history of DKA in January, but denies any increased thirst or urination.  The history is provided by the patient.    Past Medical History  Diagnosis Date  . Diabetes mellitus     Past Surgical History  Procedure Date  . Tonsillectomy   . Cesarean section   . Chiari malformation repair     Family History  Problem Relation Age of Onset  . Heart failure Other     History  Substance Use Topics  . Smoking status: Never Smoker   . Smokeless tobacco: Never Used  . Alcohol Use: No    OB History    Grav Para Term Preterm Abortions TAB SAB Ect Mult Living                  Review of Systems  Constitutional: Positive for appetite change. Negative for fever and chills.  HENT: Negative for neck stiffness and dental problem.   Eyes: Negative for visual disturbance.  Respiratory: Negative for cough, chest tightness, shortness of breath and wheezing.   Cardiovascular:  Negative for chest pain.  Gastrointestinal: Positive for nausea and vomiting. Negative for abdominal pain, diarrhea, constipation, blood in stool, abdominal distention, anal bleeding and rectal pain.  Genitourinary: Negative for dysuria, urgency, hematuria and flank pain.  Musculoskeletal: Negative for myalgias and arthralgias.  Skin: Negative for rash.  Neurological: Negative for dizziness, syncope, speech difficulty, numbness and headaches.  Hematological: Does not bruise/bleed easily.  All other systems reviewed and are negative.    Allergies  Codeine  Home Medications   Current Outpatient Rx  Name Route Sig Dispense Refill  . INSULIN ASPART 100 UNIT/ML [place] SOLN Subcutaneous Inject 10 Units into the skin 3 (three) times daily before meals. Patient on sliding scale    . INSULIN GLARGINE 100 UNIT/ML [place] SOLN Subcutaneous Inject 40 Units into the skin at bedtime.    Marland Kitchen ONE-DAILY MULTI VITAMINS PO TABS Oral Take 1 tablet by mouth daily.        BP 111/75  Pulse 106  Temp(Src) 97.7 F (36.5 C) (Oral)  Resp 16  Ht 5\' 4"  (1.626 m)  Wt 148 lb (67.132 kg)  BMI 25.40 kg/m2  SpO2 98%  LMP 12/14/2011  Physical Exam  Nursing note and vitals reviewed. Constitutional: Vital signs are normal. She appears well-developed and well-nourished. No distress.  HENT:  Head: Normocephalic and atraumatic.  Mouth/Throat: Uvula is midline, oropharynx is clear and moist and mucous membranes are normal.  Eyes: Conjunctivae and EOM are normal. Pupils are equal, round, and reactive to light.  Neck: Normal range of motion and full passive range of motion without pain. Neck supple. No spinous process tenderness and no muscular tenderness present. No rigidity. No Brudzinski's sign noted.  Cardiovascular: Regular rhythm.        Tachycardia   Pulmonary/Chest: Effort normal and breath sounds normal. No accessory muscle usage. Not tachypneic. No respiratory distress.  Abdominal: Soft. Normal appearance. She  exhibits no distension, no ascites, no pulsatile midline mass and no mass. There is no tenderness. There is no CVA tenderness. No hernia.  Lymphadenopathy:    She has no cervical adenopathy.  Neurological: She is alert.  Skin: Skin is warm and dry. No rash noted. She is not diaphoretic.  Psychiatric: She has a normal mood and affect. Her speech is normal and behavior is normal.    ED Course  Procedures (including critical care time)  Labs Reviewed  GLUCOSE, CAPILLARY - Abnormal; Notable for the following:    Glucose-Capillary 237 (*)    All other components within normal limits  BASIC METABOLIC PANEL  CBC  DIFFERENTIAL   No results found.   No diagnosis found.    MDM  Hyperglycemia, N/V  Pt care resumed by Elsie Stain, PA-C. Labs pending.         Jaci Carrel, [place] 12/19/11 2014

## 2011-12-19 NOTE — ED Notes (Signed)
CBG 237 

## 2011-12-19 NOTE — ED Provider Notes (Signed)
Medical screening examination/treatment/procedure(s) were performed by non-physician practitioner and as supervising physician I was immediately available for consultation/collaboration.   Lynleigh Kovack Y. Kurt Azimi, MD 12/19/11 2246 

## 2011-12-19 NOTE — ED Provider Notes (Signed)
History     CSN: [id number]  Arrival date & time 12/19/11  1751   First MD Initiated Contact with Patient 12/19/11 1909      Chief Complaint  Patient presents with  . Hyperglycemia    (Consider location/radiation/quality/duration/timing/severity/associated sxs/prior treatment) HPI  Past Medical History  Diagnosis Date  . Diabetes mellitus     Past Surgical History  Procedure Date  . Tonsillectomy   . Cesarean section   . Chiari malformation repair     Family History  Problem Relation Age of Onset  . Heart failure Other     History  Substance Use Topics  . Smoking status: Never Smoker   . Smokeless tobacco: Never Used  . Alcohol Use: No    OB History    Grav Para Term Preterm Abortions TAB SAB Ect Mult Living                  Review of Systems  Allergies  Codeine  Home Medications   Current Outpatient Rx  Name Route Sig Dispense Refill  . INSULIN ASPART 100 UNIT/ML [place] SOLN Subcutaneous Inject 10 Units into the skin 3 (three) times daily before meals. Patient on sliding scale    . INSULIN GLARGINE 100 UNIT/ML [place] SOLN Subcutaneous Inject 40 Units into the skin at bedtime.    Marland Kitchen ONE-DAILY MULTI VITAMINS PO TABS Oral Take 1 tablet by mouth daily.        BP 111/75  Pulse 106  Temp(Src) 97.7 F (36.5 C) (Oral)  Resp 16  Ht 5\' 4"  (1.626 m)  Wt 148 lb (67.132 kg)  BMI 25.40 kg/m2  SpO2 98%  LMP 12/14/2011  Physical Exam  ED Course  Procedures (including critical care time)  Labs Reviewed  GLUCOSE, CAPILLARY - Abnormal; Notable for the following:    Glucose-Capillary 237 (*)    All other components within normal limits  BASIC METABOLIC PANEL - Abnormal; Notable for the following:    Potassium 3.4 (*)    Glucose, Bld 211 (*)    All other components within normal limits  CBC - Abnormal; Notable for the following:    Platelets 445 (*)    All other components within normal limits  URINALYSIS, ROUTINE W REFLEX MICROSCOPIC - Abnormal; Notable for  the following:    Specific Gravity, Urine 1.036 (*)    Glucose, UA >1000 (*)    Ketones, ur 40 (*)    All other components within normal limits  BLOOD GAS, VENOUS - Abnormal; Notable for the following:    pH, Ven 7.412 (*)    pCO2, Ven 44.1 (*)    pO2, Ven 29.9 (*)    Bicarbonate 27.5 (*)    Acid-Base Excess 3.0 (*)    All other components within normal limits  DIFFERENTIAL  URINE MICROSCOPIC-ADD ON  GLUCOSE, CAPILLARY  PREGNANCY, URINE   No results found.   1. Hyperglycemia without ketosis       MDM  Hyperglycemia        Arman Filter, NP 12/19/11 2226

## 2011-12-19 NOTE — Discharge Instructions (Signed)

## 2011-12-19 NOTE — ED Provider Notes (Signed)
Medical screening examination/treatment/procedure(s) were performed by non-physician practitioner and as supervising physician I was immediately available for consultation/collaboration.   Gavin Pound. Oletta Lamas, MD 12/19/11 2246

## 2011-12-19 NOTE — ED Notes (Signed)
Pt had been vomiting this weekend.  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  CBG has been "crazy" since Saturday but today since noon, hasn't been able to get a reading on her glucometer (meaning CBG>600).  Has had between 60-80units of novolog since noon to try to get the meter to give a number less than 600.  Hasn't had an appetite but hasn't eaten in fear of it being soo high.

## 2012-07-27 ENCOUNTER — Observation Stay (HOSPITAL_COMMUNITY)
Admission: EM | Admit: 2012-07-27 | Discharge: 2012-07-28 | Disposition: A | Discharge status: Home / Self Care | Payer: BC Managed Care – PPO | Attending: Internal Medicine | Admitting: Internal Medicine

## 2012-07-27 ENCOUNTER — Encounter (HOSPITAL_COMMUNITY): Payer: Self-pay | Admitting: Emergency Medicine

## 2012-07-27 ENCOUNTER — Emergency Department (HOSPITAL_COMMUNITY): Payer: BC Managed Care – PPO

## 2012-07-27 DIAGNOSIS — F3289 Other specified depressive episodes: Secondary | ICD-10-CM

## 2012-07-27 DIAGNOSIS — F329 Major depressive disorder, single episode, unspecified: Secondary | ICD-10-CM | POA: Diagnosis present

## 2012-07-27 DIAGNOSIS — E1110 Type 2 diabetes mellitus with ketoacidosis without coma: Secondary | ICD-10-CM

## 2012-07-27 DIAGNOSIS — Z79899 Other long term (current) drug therapy: Secondary | ICD-10-CM | POA: Insufficient documentation

## 2012-07-27 DIAGNOSIS — F32A Depression, unspecified: Secondary | ICD-10-CM | POA: Diagnosis present

## 2012-07-27 DIAGNOSIS — E875 Hyperkalemia: Secondary | ICD-10-CM | POA: Diagnosis present

## 2012-07-27 DIAGNOSIS — R0602 Shortness of breath: Secondary | ICD-10-CM | POA: Insufficient documentation

## 2012-07-27 DIAGNOSIS — E1010 Type 1 diabetes mellitus with ketoacidosis without coma: Principal | ICD-10-CM | POA: Insufficient documentation

## 2012-07-27 DIAGNOSIS — E109 Type 1 diabetes mellitus without complications: Secondary | ICD-10-CM | POA: Diagnosis present

## 2012-07-27 DIAGNOSIS — E871 Hypo-osmolality and hyponatremia: Secondary | ICD-10-CM | POA: Diagnosis present

## 2012-07-27 DIAGNOSIS — Z794 Long term (current) use of insulin: Secondary | ICD-10-CM | POA: Insufficient documentation

## 2012-07-27 LAB — BASIC METABOLIC PANEL
BUN: 15 mg/dL (ref 6–23)
BUN: 11 mg/dL (ref 6–23)
CO2: 20 mEq/L (ref 19–32)
CO2: 15 mEq/L — ABNORMAL LOW (ref 19–32)
Calcium: 8.5 mg/dL (ref 8.4–10.5)
Calcium: 8 mg/dL — ABNORMAL LOW (ref 8.4–10.5)
Calcium: 8.2 mg/dL — ABNORMAL LOW (ref 8.4–10.5)
Chloride: 101 mEq/L (ref 96–112)
Creatinine, Ser: 0.6 mg/dL (ref 0.50–1.10)
Creatinine, Ser: 0.63 mg/dL (ref 0.50–1.10)
GFR calc Af Amer: >90 mL/min (ref >90)
GFR calc non Af Amer: >90 mL/min (ref >90)
GFR calc non Af Amer: >90 mL/min (ref >90)
Glucose, Bld: 376 mg/dL — ABNORMAL HIGH (ref 70–99)
Glucose, Bld: 195 mg/dL — ABNORMAL HIGH (ref 70–99)

## 2012-07-27 LAB — CBC WITH DIFFERENTIAL/PLATELET
Eosinophils Absolute: 0.1 10*3/uL (ref 0.0–0.7)
Eosinophils Relative: 1 % (ref 0–5)
HCT: 42.2 % (ref 36.0–46.0)
Hemoglobin: 14.8 g/dL (ref 12.0–15.0)
Lymphocytes Relative: 25 % (ref 12–46)
Lymphs Abs: 2.3 10*3/uL (ref 0.7–4.0)
MCH: 30.6 pg (ref 26.0–34.0)
MCV: 87.4 fL (ref 78.0–100.0)
Monocytes Absolute: 0.8 10*3/uL (ref 0.1–1.0)
Monocytes Relative: 9 % (ref 3–12)
RBC: 4.83 MIL/uL (ref 3.87–5.11)
WBC: 9.1 10*3/uL (ref 4.0–10.5)

## 2012-07-27 LAB — MAGNESIUM: Magnesium: 1.8 mg/dL (ref 1.5–2.5)

## 2012-07-27 LAB — GLUCOSE, CAPILLARY
Glucose-Capillary: 210 mg/dL — ABNORMAL HIGH (ref 70–99)
Glucose-Capillary: 138 mg/dL — ABNORMAL HIGH (ref 70–99)
Glucose-Capillary: 146 mg/dL — ABNORMAL HIGH (ref 70–99)
Glucose-Capillary: 174 mg/dL — ABNORMAL HIGH (ref 70–99)

## 2012-07-27 LAB — URINE MICROSCOPIC-ADD ON

## 2012-07-27 LAB — COMPREHENSIVE METABOLIC PANEL
ALT: 48 U/L — ABNORMAL HIGH (ref 0–35)
BUN: 16 mg/dL (ref 6–23)
CO2: 12 mEq/L — ABNORMAL LOW (ref 19–32)
Calcium: 9.7 mg/dL (ref 8.4–10.5)
GFR calc Af Amer: >90 mL/min (ref >90)
GFR calc non Af Amer: >90 mL/min (ref >90)
Glucose, Bld: 222 mg/dL — ABNORMAL HIGH (ref 70–99)
Sodium: 133 mEq/L — ABNORMAL LOW (ref 135–145)
Total Protein: 8.3 g/dL (ref 6.0–8.3)

## 2012-07-27 LAB — URINALYSIS, ROUTINE W REFLEX MICROSCOPIC
Ketones, ur: >80 mg/dL — AB
Leukocytes, UA: NEGATIVE
Nitrite: NEGATIVE
Specific Gravity, Urine: 1.028 (ref 1.005–1.030)
Urobilinogen, UA: 0.2 mg/dL (ref 0.0–1.0)

## 2012-07-27 LAB — PHOSPHORUS: Phosphorus: 3.1 mg/dL (ref 2.3–4.6)

## 2012-07-27 LAB — TROPONIN I: Troponin I: <0.3 ng/mL (ref <0.30)

## 2012-07-27 LAB — CBC
Hemoglobin: 12.1 g/dL (ref 12.0–15.0)
MCH: 30 pg (ref 26.0–34.0)
Platelets: 321 10*3/uL (ref 150–400)
RBC: 4.03 MIL/uL (ref 3.87–5.11)

## 2012-07-27 LAB — HEMOGLOBIN A1C: Hgb A1c MFr Bld: 12.8 % — ABNORMAL HIGH (ref <5.7)

## 2012-07-27 MED ORDER — DULOXETINE HCL 20 MG PO CPEP
20.0000 mg | ORAL_CAPSULE | Freq: Two times a day (BID) | ORAL | Status: DC
Start: 2012-07-27 — End: 2012-07-28
  Administered 2012-07-27 – 2012-07-28 (×2): 20 mg via ORAL
  Filled 2012-07-27 (×3): qty 1

## 2012-07-27 MED ORDER — FLUCONAZOLE 100 MG PO TABS
100.0000 mg | ORAL_TABLET | Freq: Every day | ORAL | Status: DC
  Administered 2012-07-27 – 2012-07-28 (×2): 100 mg via ORAL
  Filled 2012-07-27 (×2): qty 1

## 2012-07-27 MED ORDER — ONDANSETRON HCL 4 MG/2ML IJ SOLN: 4.0000 mg | Freq: Four times a day (QID) | INTRAMUSCULAR | Status: DC | PRN

## 2012-07-27 MED ORDER — SODIUM CHLORIDE 0.9 % IV SOLN
INTRAVENOUS | Status: AC
  Administered 2012-07-27: 0.8 [IU]/h via INTRAVENOUS
  Filled 2012-07-27: qty 1

## 2012-07-27 MED ORDER — ADULT MULTIVITAMIN W/MINERALS CH
1.0000 | ORAL_TABLET | Freq: Every day | ORAL | Status: DC
  Administered 2012-07-28: 1 via ORAL
  Filled 2012-07-27: qty 1

## 2012-07-27 MED ORDER — SODIUM CHLORIDE 0.9 % IV BOLUS (SEPSIS)
1000.0000 mL | Freq: Once | INTRAVENOUS | Status: AC
  Administered 2012-07-27: 1000 mL via INTRAVENOUS

## 2012-07-27 MED ORDER — ENOXAPARIN SODIUM 40 MG/0.4ML ~~LOC~~ SOLN
40.0000 mg | SUBCUTANEOUS | Status: DC
  Administered 2012-07-27: 40 mg via SUBCUTANEOUS
  Filled 2012-07-27 (×2): qty 0.4

## 2012-07-27 MED ORDER — SODIUM CHLORIDE 0.9 % IV SOLN: 1000.0000 mL | INTRAVENOUS | Status: DC

## 2012-07-27 MED ORDER — POTASSIUM CHLORIDE CRYS ER 20 MEQ PO TBCR
40.0000 meq | EXTENDED_RELEASE_TABLET | ORAL | Status: AC
  Administered 2012-07-27 – 2012-07-28 (×2): 40 meq via ORAL
  Filled 2012-07-27 (×2): qty 2

## 2012-07-27 MED ORDER — ACETAMINOPHEN 325 MG PO TABS: 650.0000 mg | ORAL_TABLET | Freq: Four times a day (QID) | ORAL | Status: DC | PRN

## 2012-07-27 MED ORDER — POLYETHYLENE GLYCOL 3350 17 G PO PACK
17.0000 g | PACK | Freq: Every day | ORAL | Status: DC | PRN
  Filled 2012-07-27: qty 1

## 2012-07-27 MED ORDER — INSULIN REGULAR HUMAN 100 UNIT/ML IJ SOLN: INTRAMUSCULAR | Status: DC

## 2012-07-27 MED ORDER — DEXTROSE-NACL 5-0.45 % IV SOLN
INTRAVENOUS | Status: DC
  Administered 2012-07-27: 13:00:00 via INTRAVENOUS

## 2012-07-27 MED ORDER — ONE-DAILY MULTI VITAMINS PO TABS: 1.0000 | ORAL_TABLET | Freq: Every day | ORAL | Status: DC

## 2012-07-27 MED ORDER — ONDANSETRON HCL 4 MG PO TABS: 4.0000 mg | ORAL_TABLET | Freq: Four times a day (QID) | ORAL | Status: DC | PRN

## 2012-07-27 MED ORDER — SODIUM CHLORIDE 0.9 % IV BOLUS (SEPSIS): 1000.0000 mL | Freq: Once | INTRAVENOUS | Status: AC

## 2012-07-27 MED ORDER — SODIUM CHLORIDE 0.9 % IV SOLN: INTRAVENOUS | Status: DC

## 2012-07-27 MED ORDER — SODIUM CHLORIDE 0.9 % IV SOLN
1000.0000 mL | Freq: Once | INTRAVENOUS | Status: AC
  Administered 2012-07-27: 1000 mL via INTRAVENOUS

## 2012-07-27 MED ORDER — ACETAMINOPHEN 650 MG RE SUPP: 650.0000 mg | Freq: Four times a day (QID) | RECTAL | Status: DC | PRN

## 2012-07-27 MED ORDER — INSULIN GLARGINE 100 UNIT/ML ~~LOC~~ SOLN
30.0000 [IU] | Freq: Once | SUBCUTANEOUS | Status: AC
  Administered 2012-07-27: 30 [IU] via SUBCUTANEOUS

## 2012-07-27 MED ORDER — SODIUM CHLORIDE 0.9 % IV SOLN
INTRAVENOUS | Status: DC
  Administered 2012-07-27: 1000 mL via INTRAVENOUS
  Administered 2012-07-28: 04:00:00 via INTRAVENOUS

## 2012-07-27 MED ORDER — INSULIN ASPART 100 UNIT/ML ~~LOC~~ SOLN
0.0000 [IU] | Freq: Three times a day (TID) | SUBCUTANEOUS | Status: DC
  Administered 2012-07-28: 2 [IU] via SUBCUTANEOUS

## 2012-07-27 NOTE — ED Notes (Signed)
Sandwich tray, cheese, chips given per pt request. okayed by MD.

## 2012-07-27 NOTE — H&P (Signed)
[facility] History and Physical  [name]:[id number] DOB: September 01, 1981 DOA: 07/27/2012  Referring physician: Dr. Patria Mane PCP: No primary provider on file.    Chief Complaint: Elevated Blood sugar; DKA  HPI: [name] is a 31 y.o. female with past medical history significant for depression and diabetes mellitus type 1; came to the hospital secondary to elevated blood sugar for the past several days. Patient also reports increase fatigue and polyuria. Patient reports that she is compliant with her Lantus at night, but endorses that she recently around out of her NovoLog that she used 3 times a day in a sliding scale and was switched by her PCP to apidra pen, which according to the patient has no really control her sugars well. On the date of admission patient low sugar at home was above 400 and that moment she took some of her insulin and referred to the major department for further evaluation and treatment. In the ED no sources of infection were appreciated (normal chest x-ray, no urinalyses reflecting infection, patient afebrile and with normal WBCs); patient also have a negative troponin and normal EKG and her basic metabolic panel demonstrated anion gap of 22 with a bicarbonate of 12. [facility] has been called to admit the patient for further evaluation and treatment. Of note Patient denies any chest pain, cough, abdominal pain, nausea/vomiting, diarrhea, melena, headaches or fever/chills. Patient endorses increased respiratory rate and mild shortness of breath during the last 2 days that her blood sugar has been running high.  Review of Systems:  Negative except as mentioned on history of present illness.  Past Medical History  Diagnosis Date  . Diabetes mellitus    Past Surgical History  Procedure Date  . Tonsillectomy   . Cesarean section   . Chiari malformation repair    Social History:  reports that she has never smoked. She has never used smokeless  tobacco. She reports that she does not drink alcohol or use illicit drugs. patient lives at home with her husband and daughter, do not require any assistant with activities of daily living.   Allergies  Allergen Reactions  . Codeine Nausea And Vomiting    Family History  Problem Relation Age of Onset  .   Heart failure Hypothyroidism and asthma  Other Mother      Prior to Admission medications   Medication Sig Start Date End Date Taking? Authorizing Provider  DULoxetine (CYMBALTA) 20 MG capsule Take 20 mg by mouth 2 (two) times daily.   Yes Historical Provider, MD  insulin aspart (NOVOLOG) 100 UNIT/ML injection Inject 10-30 Units into the skin 3 (three) times daily before meals. Patient on sliding scale 09/26/11  Yes Richarda Overlie, MD  insulin glargine (LANTUS) 100 UNIT/ML injection Inject 40 Units into the skin at bedtime. 09/26/11  Yes Richarda Overlie, MD  insulin glulisine (APIDRA) 100 UNIT/ML injection Inject 1-14 Units into the skin 3 (three) times daily before meals.   Yes Historical Provider, MD  Multiple Vitamin (MULTIVITAMIN) tablet Take 1 tablet by mouth daily.     Yes Historical Provider, MD   Physical Exam: Filed Vitals:   07/27/12 1021 07/27/12 1417 07/27/12 1450  BP: 118/79 106/70 110/65  Pulse: 129 107 102  Temp: 98.4 F (36.9 C)  98.5 F (36.9 C)  TempSrc: Oral  Oral  Resp: 20 18 18   Height:   5\' 4"  (1.626 m)  Weight:   63.1 kg (139 lb 1.8 oz)  SpO2: 100% 100% 100%  General:  No acute distress, afebrile, cooperative to examination.  Eyes: PERRLA, extraocular muscles intact, no icterus, no nystagmus.  ENT: Mild dryness of her mucous membranes, no erythema, no exudates, good dentition. No drainage out of her ears or nostrils.  Neck: Supple, no thyromegaly, no bruits  Cardiovascular: Mild tachycardia, no rubs, no murmurs, no gallops  Respiratory: Clear to auscultation bilaterally.  Abdomen: Soft, nontender, nondistended, positive bowel sounds; no  guarding.  Skin: no rash or petechiae  Musculoskeletal: no joint swelling or erythema  Psychiatric: mild flat affect, but no SI, no hallucinations.  Neurologic: Alert, awake and oriented x3, cranial nerves intact; muscle strength 5 out of 5 bilaterally and symmetrically. No focal motor or sensory deficit appreciated.  Labs on Admission:  Basic Metabolic Panel:  Lab 07/27/12 0102  NA 133*  K 5.3*  CL 94*  CO2 12*  GLUCOSE 222*  BUN 16  CREATININE 0.68  CALCIUM 9.7  MG --  PHOS --   Liver Function Tests:  Lab 07/27/12 1105  AST 41*  ALT 48*  ALKPHOS 107  BILITOT 0.3  PROT 8.3  ALBUMIN 4.0   CBC:  Lab 07/27/12 1105  WBC 9.1  NEUTROABS 5.9  HGB 14.8  HCT 42.2  MCV 87.4  PLT 418*   CBG:  Lab 07/27/12 1513 07/27/12 1412 07/27/12 1241 07/27/12 1018  GLUCAP 174* 146* 138* 210*    Radiological Exams on Admission: Dg Chest 2 View  07/27/2012  *RADIOLOGY REPORT*  Clinical Data: Diabetes.  Shortness of breath.  CHEST - 2 VIEW  Comparison: 09/23/2011  Findings: Heart size is normal.  There are perihilar bronchitic changes.  No focal consolidations or pleural effusions are identified.  No edema. Visualized osseous structures have a normal appearance.  IMPRESSION:  1.  Bronchitic changes. 2. No focal pulmonary abnormality.   Original Report Authenticated By: Norva Pavlov, M.D.     EKG:  Rate: 113  Rhythm: Sinus tachycardia  QRS Axis: normal  Intervals: normal  ST/T Wave abnormalities: normal  Conduction Disutrbances: none  Narrative Interpretation:  Old EKG Reviewed: No significant changes noted  Assessment/Plan 1-DKA (diabetic ketoacidoses): Secondary to insufficient amount of insulin; patient ran out of her usual NovoLog and received a pain of Apidra from her PCP office which she has been using for the last week or so, patient reports that since then she had noticing her blood sugar to be running on the high end. There is no signs or sources of infection,  patient denies alcohol or use of any illicit drugs. Patient also reports that she had no had any recent A1c or adjustment on her maintenance insulin dose -Admit to regular floor for IV is Consuella Lose, fluid resuscitation and adjustment of her regimen for diabetes. -Noncaloric clear liquid diet -Follow basic metabolic panels in order to trend patient anion gap and bicarbonate. -Once her bicarbonate is above 20 and her anion gap is 12 or less will transition insulin drip to subcutaneous insulin and adjust her regimen. At that moment patient will be also started him modify carbohydrate diet.  2-Hyperkalemia: Secondary to lack of insulin and also acidotic state versus hemolysis on the blood sample. Level was 5.3 and at this moment no abnormalities were appreciated on patient EKG. Will hold on any treatment for hyperkalemia at this moment and will follow potassium trend on subsequent basic metabolic panel.  3-Hyponatremia: Secondary to mild dehydration and also hyperglycemia status. Will provide fluid resuscitation and follow sodium level after her blood sugar is controlled.  4-DM (diabetes mellitus), type 1: Will check hemoglobin A1c and adjust maintenance regimen once her DKA is resolved.  5-Depression: Continue Cymbalta.  DVT: Lovenox  Code Status: Full Family Communication: no family at bedside Disposition Plan: Home when medically stable  Time spent: >30 minutes  Adalae Baysinger [facility] Pager [phone]  If 7PM-7AM, please contact night-coverage [email or web address] Password [facility] 07/27/2012, 4:34 PM

## 2012-07-27 NOTE — ED Provider Notes (Addendum)
History     CSN: [id number]  Arrival date & time 07/27/12  1014   First MD Initiated Contact with Patient 07/27/12 1044      Chief Complaint  Patient presents with  . Hyperglycemia     The history is provided by the patient and medical records.   the patient has a history of diabetes and states that her blood sugars have been elevated for the past several days.  She's compliant with her Lantus at night.  She's recently switched brand of insulin for sliding scale over the past 5 days.  This morning her blood sugar was in the 400s and she gave herself insulin and brought her self the emergency department.  She denies nausea vomiting or abdominal pain.  No chest pain.  She does report mild shortness of breath without cough or congestion or fevers or chills.  No diarrhea.  No abdominal pain.  No urinary complaints.  Her symptoms are mild to moderate in severity  Past Medical History  Diagnosis Date  . Diabetes mellitus     Past Surgical History  Procedure Date  . Tonsillectomy   . Cesarean section   . Chiari malformation repair     Family History  Problem Relation Age of Onset  . Heart failure Other     History  Substance Use Topics  . Smoking status: Never Smoker   . Smokeless tobacco: Never Used  . Alcohol Use: No    OB History    Grav Para Term Preterm Abortions TAB SAB Ect Mult Living                  Review of Systems  All other systems reviewed and are negative.    Allergies  Codeine  Home Medications   Current Outpatient Rx  Name  Route  Sig  Dispense  Refill  . DULOXETINE HCL 20 MG PO CPEP   Oral   Take 20 mg by mouth 2 (two) times daily.         . INSULIN ASPART 100 UNIT/ML [place] SOLN   Subcutaneous   Inject 10-30 Units into the skin 3 (three) times daily before meals. Patient on sliding scale         . INSULIN GLARGINE 100 UNIT/ML [place] SOLN   Subcutaneous   Inject 40 Units into the skin at bedtime.         . INSULIN GLULISINE 100 UNIT/ML  [place] SOLN   Subcutaneous   Inject 1-14 Units into the skin 3 (three) times daily before meals.         Marland Kitchen ONE-DAILY MULTI VITAMINS PO TABS   Oral   Take 1 tablet by mouth daily.             BP 118/79  Pulse 129  Temp 98.4 F (36.9 C) (Oral)  Resp 20  SpO2 100%  LMP 07/02/2012  Physical Exam  Nursing note and vitals reviewed. Constitutional: She is oriented to person, place, and time. She appears well-developed and well-nourished. No distress.  HENT:  Head: Normocephalic and atraumatic.  Eyes: EOM are normal.  Neck: Normal range of motion.  Cardiovascular: Normal rate, regular rhythm and normal heart sounds.   Pulmonary/Chest: Effort normal and breath sounds normal.  Abdominal: Soft. She exhibits no distension. There is no tenderness.  Musculoskeletal: Normal range of motion.  Neurological: She is alert and oriented to person, place, and time.  Skin: Skin is warm and dry.  Psychiatric: She has a normal mood  and affect. Judgment normal.    ED Course  Procedures (including critical care time)  Labs Reviewed  GLUCOSE, CAPILLARY - Abnormal; Notable for the following:    Glucose-Capillary 210 (*)     All other components within normal limits  CBC WITH DIFFERENTIAL - Abnormal; Notable for the following:    Platelets 418 (*)     All other components within normal limits  COMPREHENSIVE METABOLIC PANEL - Abnormal; Notable for the following:    Sodium 133 (*)     Potassium 5.3 (*)     Chloride 94 (*)     CO2 12 (*)     Glucose, Bld 222 (*)     AST 41 (*)     ALT 48 (*)     All other components within normal limits  URINALYSIS, ROUTINE W REFLEX MICROSCOPIC - Abnormal; Notable for the following:    APPearance CLOUDY (*)     Glucose, UA >1000 (*)     Bilirubin Urine MODERATE (*)     Ketones, ur >80 (*)     Protein, ur 100 (*)     All other components within normal limits  URINE MICROSCOPIC-ADD ON - Abnormal; Notable for the following:    Squamous Epithelial / LPF MANY  (*)     Casts HYALINE CASTS (*)  GRANULAR CAST   All other components within normal limits  PREGNANCY, URINE   Dg Chest 2 View  07/27/2012  *RADIOLOGY REPORT*  Clinical Data: Diabetes.  Shortness of breath.  CHEST - 2 VIEW  Comparison: 09/23/2011  Findings: Heart size is normal.  There are perihilar bronchitic changes.  No focal consolidations or pleural effusions are identified.  No edema. Visualized osseous structures have a normal appearance.  IMPRESSION:  1.  Bronchitic changes. 2. No focal pulmonary abnormality.   Original Report Authenticated By: Norva Pavlov, M.D.    I personally reviewed the imaging tests through PACS system I reviewed available ER/hospitalization records through the EMR   1. DKA (diabetic ketoacidoses)      Date: 07/27/2012  Rate: 113  Rhythm: Sinus tachycardia  QRS Axis: normal  Intervals: normal  ST/T Wave abnormalities: normal  Conduction Disutrbances: none  Narrative Interpretation:   Old EKG Reviewed: No significant changes noted     MDM  The patient has evidence of diabetic ketoacidosis with anion gap 27 and a bicarbonate of 12.  The patient will be admitted to the hospital.  She's been started on insulin drip and given that her blood sugar was 210 on arrival here she'll be started on D5 with her insulin drip.  Hospitalist is requesting a second IV be started.  Dr. Gwenlyn Perking will be admitting the patient        Lyanne Co, MD 07/27/12 1235  Lyanne Co, MD 07/27/12 1235

## 2012-07-27 NOTE — ED Notes (Signed)
Pt states she is a hard stick.  RN will try to put in IV and obtain labs at same time.  RN to notify me if i need to draw labs.

## 2012-07-27 NOTE — ED Notes (Signed)
Pt known diabetic, was given insulin samples by her physician and when her regular insulin ran out she started using the samples on Wednesday. She is shaky, tearful, short of breath and can't get her sugar down.

## 2012-07-28 LAB — BASIC METABOLIC PANEL
BUN: 9 mg/dL (ref 6–23)
BUN: 7 mg/dL (ref 6–23)
Calcium: 7.9 mg/dL — ABNORMAL LOW (ref 8.4–10.5)
Calcium: 8 mg/dL — ABNORMAL LOW (ref 8.4–10.5)
Calcium: 8.4 mg/dL (ref 8.4–10.5)
Creatinine, Ser: 0.52 mg/dL (ref 0.50–1.10)
Creatinine, Ser: 0.5 mg/dL (ref 0.50–1.10)
Creatinine, Ser: 0.46 mg/dL — ABNORMAL LOW (ref 0.50–1.10)
GFR calc Af Amer: >90 mL/min (ref >90)
GFR calc Af Amer: >90 mL/min (ref >90)
GFR calc Af Amer: >90 mL/min (ref >90)
GFR calc non Af Amer: >90 mL/min (ref >90)
GFR calc non Af Amer: >90 mL/min (ref >90)
GFR calc non Af Amer: >90 mL/min (ref >90)
Glucose, Bld: 122 mg/dL — ABNORMAL HIGH (ref 70–99)
Potassium: 4 mEq/L (ref 3.5–5.1)

## 2012-07-28 LAB — CBC
MCH: 29.7 pg (ref 26.0–34.0)
MCV: 85.8 fL (ref 78.0–100.0)
Platelets: 292 10*3/uL (ref 150–400)
RBC: 3.74 MIL/uL — ABNORMAL LOW (ref 3.87–5.11)
RDW: 13.3 % (ref 11.5–15.5)

## 2012-07-28 LAB — GLUCOSE, CAPILLARY
Glucose-Capillary: 114 mg/dL — ABNORMAL HIGH (ref 70–99)
Glucose-Capillary: 134 mg/dL — ABNORMAL HIGH (ref 70–99)

## 2012-07-28 MED ORDER — INSULIN GLARGINE 100 UNIT/ML ~~LOC~~ SOLN: 30.0000 [IU] | Freq: Two times a day (BID) | SUBCUTANEOUS | Status: DC

## 2012-07-28 MED ORDER — FLUCONAZOLE 100 MG PO TABS: 100.0000 mg | ORAL_TABLET | Freq: Every day | ORAL | Status: DC

## 2012-07-28 NOTE — Progress Notes (Signed)
Patient has been given all discharge instructions, expressed understanding. Explained about "my chart" access. Will discharge home with husband.

## 2012-07-28 NOTE — Discharge Summary (Signed)
Physician Discharge Summary  PEYTIN DECHERT WUJ:[id number] DOB: 09-04-1981 DOA: 07/27/2012  PCP: No primary provider on file.  Admit date: 07/27/2012 Discharge date: 07/28/2012  Time spent: >30 minutes  Recommendations for Outpatient Follow-up:  -Keep yourself well hydrated -Arrange follow up with PCP in 7 days -Arrange follow up with endocrinologist to establish care and continue treatment for your diabetes. -Take medications as prescribed -Follow a low carbohydrates diet  Discharge Diagnoses:  Principal Problem:  *DKA (diabetic ketoacidoses) Active Problems:  Hyperkalemia  Hyponatremia  DM (diabetes mellitus), type 1  Depression   Discharge Condition: stable and improved. Patient will be discharge home and has been advised to follow discharge instructions and also medications as prescribed.   Diet recommendation: low carbohydrates diet  Filed Weights   07/27/12 1450  Weight: 63.1 kg (139 lb 1.8 oz)    History of present illness:  31 y.o. female with past medical history significant for depression and diabetes mellitus type 1; came to the hospital secondary to elevated blood sugar for the past several days. Patient also reports increase fatigue and polyuria. Patient reports that she is compliant with her Lantus at night, but endorses that she recently around out of her NovoLog that she used 3 times a day in a sliding scale and was switched by her PCP to apidra pen, which according to the patient has no really control her sugars well. On the date of admission patient low sugar at home was above 400 and that moment she took some of her insulin and referred to the major department for further evaluation and treatment. In the ED no sources of infection were appreciated (normal chest x-ray, no urinalyses reflecting infection, patient afebrile and with normal WBCs); patient also have a negative troponin and normal EKG and her basic metabolic panel demonstrated anion gap of 22 with a  bicarbonate of 12.   Hospital Course:  1-DKA (diabetic ketoacidoses): Secondary to insufficient amount of insulin; patient ran out of her usual NovoLog and received a pain of Apidra from her PCP office which she has been using for the last week or so, patient reports that since then she had noticing her blood sugar to be running on the high end. There is no signs or source of infection, patient denies alcohol or use of any illicit drugs. Patient also reports that she had no had any recent A1c or adjustment on her maintenance insulin dose. -After receiving IV insulin therapy and correction of her electrolytes abnormalities; insulin was transitioned to lantus and SSI and her diet advance; patient tolerate treatment and CBG's remains in the 130's range after transition and diet. -Will discharge on novolog SSI and lantus BID (30 units) -A1C 12.8 -Patient will arrange follow up with endocrinologist and also will follow with PCP.  2-Hyperkalemia: Secondary to lack of insulin and also acidotic state versus hemolysis on the blood sample. Level was 5.3 on admission with subsequent transient hypokalemia after been on iv insulin. Electrolytes where repleted and WNL at discharge.  3-Hyponatremia: Secondary to mild dehydration and also hyperglycemia status. Resolved with IVF's and correction of hyperglycemia.  4-DM (diabetes mellitus), type 1: As above. -low carb diet -Lantus adjusted to BID -Continue novolog SSI TID -close follow up by PCP and also endocrinologist. -A1C 12.8  5-Depression: Continue Cymbalta.   Consultations:  none  Discharge Exam: Filed Vitals:   07/27/12 1417 07/27/12 1450 07/27/12 2156 07/28/12 0549  BP: 106/70 110/65 124/87 110/71  Pulse: 107 102 95 81  Temp:  98.5  F (36.9 C) 98.2 F (36.8 C) 98.5 F (36.9 C)  TempSrc:  Oral Oral Oral  Resp: 18 18 18 18   Height:  5\' 4"  (1.626 m)    Weight:  63.1 kg (139 lb 1.8 oz)    SpO2: 100% 100% 100% 100%   General: No acute  distress, afebrile, cooperative to examination. Cardiovascular: Mild tachycardia, no rubs, no murmurs, no gallops  Respiratory: Clear to auscultation bilaterally.  Abdomen: Soft, nontender, nondistended, positive bowel sounds; no guarding. Extremities: no edema, cyanosis or clubbing Neuro: non focal  Discharge Instructions  Discharge Orders    Future Orders Please Complete By Expires   Discharge instructions      Comments:   -Keep yourself well hydrated -Arrange follow up with PCP in 7 days -Arrange follow up with endocrinologist to establish care and continue treatment for your diabetes. -Take medications as prescribed -Follow a low carbohydrates diet       Medication List     As of 07/28/2012  1:42 PM    STOP taking these medications         insulin glulisine 100 UNIT/ML injection   Commonly known as: APIDRA      TAKE these medications         DULoxetine 20 MG capsule   Commonly known as: CYMBALTA   Take 20 mg by mouth 2 (two) times daily.      fluconazole 100 MG tablet   Commonly known as: DIFLUCAN   Take 1 tablet (100 mg total) by mouth daily.      insulin aspart 100 UNIT/ML injection   Commonly known as: novoLOG   Inject 10-30 Units into the skin 3 (three) times daily before meals. Patient on sliding scale      insulin glargine 100 UNIT/ML injection   Commonly known as: LANTUS   Inject 30 Units into the skin 2 (two) times daily.      multivitamin tablet   Take 1 tablet by mouth daily.           Follow-up Information    Schedule an appointment as soon as possible for a visit with Carlus Pavlov, MD.   Contact information:   [address] [place] [address] [phone]           The results of significant diagnostics from this hospitalization (including imaging, microbiology, ancillary and laboratory) are listed below for reference.    Significant Diagnostic Studies: Dg Chest 2 View  07/27/2012  *RADIOLOGY REPORT*   Clinical Data: Diabetes.  Shortness of breath.  CHEST - 2 VIEW  Comparison: 09/23/2011  Findings: Heart size is normal.  There are perihilar bronchitic changes.  No focal consolidations or pleural effusions are identified.  No edema. Visualized osseous structures have a normal appearance.  IMPRESSION:  1.  Bronchitic changes. 2. No focal pulmonary abnormality.   Original Report Authenticated By: Norva Pavlov, M.D.     Microbiology: No results found for this or any previous visit (from the past 240 hour(s)).   Labs: Basic Metabolic Panel:  Lab 07/28/12 8295 07/28/12 0429 07/28/12 0135 07/27/12 2218 07/27/12 1927 07/27/12 1705  NA 134* 135 130* 131* 130* --  K 4.0 3.8 3.6 3.9 3.4* --  CL 103 104 99 98 96 --  CO2 20 20 18* 16* 15* --  GLUCOSE 122* 103* 142* 195* 376* --  BUN 7 9 9 11 14  --  CREATININE 0.46* 0.50 0.52 0.57 0.63 --  CALCIUM 8.4 8.0* 7.9* 8.2* 8.0* --  MG -- -- -- -- -- 1.8  PHOS -- -- -- -- -- 3.1   Liver Function Tests:  Lab 07/27/12 1105  AST 41*  ALT 48*  ALKPHOS 107  BILITOT 0.3  PROT 8.3  ALBUMIN 4.0   CBC:  Lab 07/28/12 0429 07/27/12 1705 07/27/12 1105  WBC 5.2 6.2 9.1  NEUTROABS -- -- 5.9  HGB 11.1* 12.1 14.8  HCT 32.1* 34.6* 42.2  MCV 85.8 85.9 87.4  PLT 292 321 418*   Cardiac Enzymes:  Lab 07/27/12 2218 07/27/12 1705  CKTOTAL -- --  CKMB -- --  CKMBINDEX -- --  TROPONINI <0.30 <0.30   CBG:  Lab 07/28/12 1107 07/28/12 0753 07/28/12 0712 07/28/12 0536 07/28/12 0147  GLUCAP 134* 114* 86 92 139*     Signed:  Danylah Holden  [facility] 07/28/2012, 1:42 PM

## 2012-07-30 NOTE — Progress Notes (Signed)
Initial review for observation status is complete. 

## 2012-08-01 ENCOUNTER — Encounter: Payer: Self-pay | Admitting: Internal Medicine

## 2012-08-01 ENCOUNTER — Ambulatory Visit (INDEPENDENT_AMBULATORY_CARE_PROVIDER_SITE_OTHER): Payer: BC Managed Care – PPO | Admitting: Internal Medicine

## 2012-08-01 VITALS — BP 102/68 | HR 94 | Temp 98.5°F | Resp 16 | Ht 65.0 in | Wt 145.0 lb

## 2012-08-01 DIAGNOSIS — E109 Type 1 diabetes mellitus without complications: Secondary | ICD-10-CM

## 2012-08-01 MED ORDER — INSULIN GLARGINE 100 UNIT/ML ~~LOC~~ SOLN: SUBCUTANEOUS | Status: DC

## 2012-08-01 MED ORDER — INSULIN ASPART 100 UNIT/ML ~~LOC~~ SOLN: SUBCUTANEOUS | Status: DC

## 2012-08-01 MED ORDER — GLUCAGON (RDNA) 1 MG IJ KIT: 1.0000 mg | PACK | Freq: Once | INTRAMUSCULAR | Status: DC | PRN

## 2012-08-01 NOTE — Progress Notes (Addendum)
Subjective:     Patient ID: [name], female   DOB: 11-17-1980, 31 y.o.   MRN: [id number]  HPI [name] is a pleasant WW with PMH of DM1 dx in 09/2004, uncontrolled, with multiple admissions for DKA in the past. She was referred to me by Dr. Vassie Loll ([facility]) after her last admission on 07/27/2012. I reviewed pt's chart, hospital records and labs available in Epic. She did not have insurance in the past and has been seen by Drs. Sharl Ma and [facility]) and also Dr. Talmage Nap, but blocked from the system; now has BCBS and will start seeing me.  After her DM1 diagnosis, started on a basal-bolus regimen, then switched to an insulin pump. She had multiple malfunctions of the pump, with subsequent ED visits, therefore, she was taken off the pump and restarted on basal-bolus regimen.   She now takes: - Lantus 30 units in am and 20 units in am (tried 30 at night but sugars dropping overnight) - Novolog 10-30 units tid ac based on a SSI:  Target CBG:150 SSI: 150-180: +1 181-200: +2 201-225: +3 225-250: +4 251-275: +6 276-300: +8 301-325: +9 326-350: +10 351-375: +12 >376: +14 ICR: 1:4 g of carbs (upon questioning, not taking)  She checks her sugars 8-10 times a day: fasting, before+2h after meal and bedtime. I reviewed her meter for sugars obtained after her admission:  Day/time am Before lunch Before dinner Night  11/11 48>61>148 (overcorrected) 299 - 8 units 111 61  11/12 143  235 - 6 units   11/13 58 166 - 2 units    11/14 364     The amount of Novolog listed was actually all the mealtime insulin that she took, as she cannot afford her Novolog - only husband working, 1 box of 5 pens cost her 300$.  Lowest sugar: 48, woke her up in am. Has hypoglycemia awareness. Does not have a glucagon kit. Does not have a MedAlert bracelet/pendant.  The last hospitalization was brought about by the fact that pt switched from Novolog to Apidra (had samples),  which, she mentions, does not work well for her.   Last HbA1C during the last hospitalization returned at: Lab Results  Component Value Date   HGBA1C 12.8* 07/27/2012  which is consistent to prior values obtained in 2011 and this year. No previous UACR found in the system. No known complications of her DM1. Last eye exam 1 year ago: no DR, reportedly. She has a FH of DM2 and 1.   Had the flu vaccine this fall.  PMH:  HTN Migraines Depression Seasonal allergies  Past Medical History  Diagnosis Date  . Diabetes mellitus     Past Surgical History  Procedure Date  . Tonsillectomy   . Cesarean section   . Chiari malformation repair    History   Social History  . Marital Status: Married    Spouse Name: N/A    Number of Children: N/A  . Years of Education: N/A   Occupational History  . Not on file.   Social History Main Topics  . Smoking status: Never Smoker   . Smokeless tobacco: Never Used  . Alcohol Use: No  . Drug Use: No  . Sexually Active: No   Lives in [place]. . No narrative on file    Current Outpatient Prescriptions on File Prior to Visit  Medication Sig Dispense Refill  . DULoxetine (CYMBALTA) 20 MG capsule Take 20 mg by mouth 2 (two) times daily.      Marland Kitchen  insulin aspart (NOVOLOG) 100 UNIT/ML injection Inject 10-30 Units into the skin 3 (three) times daily before meals. Patient on sliding scale      . insulin glargine (LANTUS) 100 UNIT/ML injection Inject 30 Units into the skin 2 (two) times daily.  3 mL  1  . Multiple Vitamin (MULTIVITAMIN) tablet Take 1 tablet by mouth daily.        . fluconazole (DIFLUCAN) 100 MG tablet Take 1 tablet (100 mg total) by mouth daily.  3 tablet  0    Allergies  Allergen Reactions  . Codeine Nausea And Vomiting    Family History  Problem Relation Age of Onset  . Heart failure Other    Review of Systems Constitutional: no weight gain/loss, no fatigue Eyes: no blurry vision, no xerophthalmia ENT: no sore throat, no  nodules palpated in throat Cardiovascular: no CP/SOB/palpitations/leg swelling Respiratory: no cough/SOB Gastrointestinal: no N/V/D/C Musculoskeletal: no muscle/joint aches Skin: no rashes, no cuts or sores on feet Neurological: no tremors/numbness/tingling/dizziness Psychiatric: no depression - on Cymbalta     Objective:   Physical Exam BP 102/68  Pulse 94  Temp 98.5 F (36.9 C) (Oral)  Resp 16  Ht 5\' 5"  (1.651 m)  Wt 145 lb (65.772 kg)  BMI 24.13 kg/m2  SpO2 98%  LMP 07/02/2012 Constitutional: normal weight, in NAD Eyes: PERRLA, EOMI, no exophthalmos ENT: moist mucous membranes, no thyromegaly, no cervical lymphadenopathy Cardiovascular: RRR, No MRG Respiratory: CTA B Gastrointestinal: abdomen soft, NT, ND, BS+ Musculoskeletal: no deformities, strength intact in all 4 Skin: moist, warm, no rashes Neurological: DTR normal in all 4  Assessment:     1. DM1, without complications, uncontrolled - multiple DKA admissions - tried insulin pump in the past, but multiple malfunctions, now on basal-bolus regimen    Plan:     - pt with uncontrolled DM1, with multiple DKA admissions, last 4 days ago. - reviewing her regimen and her meter, pt has multiple lows, despite reducing the basal insulin from the recommended 30 units bid at discharge to 30 units in am and 20 units in hs. - she has large variability in her fasting sugars, which can be a sign of too much basal insulin  - She barely takes any Novolog and does not cover her meals, only uses a SSI (trying to save her Novolog pens). She mentions she knows how to carb count and uses about 45 g carbs per meal (~equal meals throughout the day) - We discussed about the need to cover her meals with insulin, which she can do in 2 ways: either by carb counting and using an ICR (ideal) but also by giving her a fixed dose of insulin with each meal and adjust the amount based on the quantity of food/carbs that she will eat. She chose the  latter. Therefore, she will inject: 8 units of Novolog with each meal (~1:7 ICR). She is to add SSI if needed on top of this. - I will make the reciprocal change in her Lantus: decrease the dose to 20 units in am and 10 units in hs. I explained to her that the basal insulin dose should be close to her daily mealtime dose to avoid lows and variability. - I gave her 3 Novolog pen samples - given Glucagon Rx and advised about when to use it - will check a UACR, but I expect it to be high and decrease as her DM control improves - advised to use a MedAlert pendant or bracelet, or at least  place a note that she has DM1 in her wallet - given handout with general DM1 instructions, the 15-15 rule for hypoglycemia, foot care - RTC in 1 month with her log  Office Visit on 08/01/2012  Component Date Value Range Status  . Microalb, Ur 08/01/2012 0.51  0.00 - 1.89 mg/dL Final  . Creatinine, Urine 08/01/2012 65.8   Final  . Microalb Creat Ratio 08/01/2012 7.8  0.0 - 30.0 mg/g Final   No significant amount of microalbinuria. Letter sent.

## 2012-08-01 NOTE — Patient Instructions (Addendum)
Please decrease the Lantus dose to 20 units in am and 10 units at night. Start taking 8 units of Novolog with each meal (3 times a day), and add insulin based on your Sliding Scale as needed.  Please return to see me in 1 month. I will give you a sugar log, please fill the sugars in this and bring it at your next appointment. Please call me with any questions or concerns, or if you sugars are consistently lower than 70 or higher than 200. I gave you a prescription for glucagon injection, to use if you cannot take sugar by mouth and your sugar is low. Please join MyChart, a patient portal that allows you to see your labs and communicate with me.  Basic Rules for Patients with Type I Diabetes Mellitus  1. The American Diabetes Association (ADA) recommended targets: - fasting sugar <130 - after meal sugar <180 - HbA1C <7%  2. Engage in ?150 min moderate exercise per week  3. Make sure you have ?8h of sleep every night as this helps both blood sugars and your weight.  4. "15-15 rule" for hypoglycemia: if sugars are low, take 15 g of carbs** ("fast sugar" - e.g. 4 glucose tablets, 4 oz orange juice), wait 15 min, then check sugars again. If still <80, repeat. Continue  until your sugars >80, then eat a normal meal.   5. Teach family members and coworkers to inject glucagon. Have a glucagon set at home and one at work. They should call 911 after using the set.  6. Check sugar before driving. If <100, correct, and only start driving if sugars rise ?846. Check sugar every hour when on a long drive.  7. Check sugar before exercising. If <100, correct, and only start exercising if sugars rise ?100. Check sugar every hour when on a long exercise routine and 1h after you finished exercising.   If >250, check urine for ketones. If you have moderate-large ketones in urine, do not start exercise. Hydrate yourself with clear liquids and correct the high sugar. Recheck sugars and ketones before attempting  to exercise.  Be aware that you might need less insulin when exercising.  *intense, short, exercise bursts can increase your sugars, but  *less intense, longer (>1h), exercise routines can decrease your sugars.   8. Make sure you have a MedAlert bracelet or pendant mentioning "Type I Diabetes Mellitus". If you have a prior episode of severe hypoglycemia or hypoglycemia unawareness, it should also mention this.  9. Please do not walk barefoot. Inspect your feet for sores/cuts and let us know if you have them.  10. Please call [facility] with any questions and concerns [phone]).   **E.g. of "fast carbs":   first choice (15 g):  1 tube glucose gel, GlucoPouch 15, 2 oz glucose liquid   second choice (15-16 g):  3 or 4 glucose tablets (best taken  with water), 15 Dextrose Bits chewable   third choice (15-20 g):   cup fruit juice,  cup regular soda, 1 cup skim milk,  1 cup sports drink   fourth choice (15-20 g):  1 small tube [place] gel (not frosting), 2 tbsp raisins, 1 tbsp table sugar,  candy, jelly beans, gum drops - check package for carb amount   (adapted from: Juluis Rainier. "Insulin therapy and hypoglycemia" Endocrinol Metab Clin N Am 2012, 41: 57-87)

## 2012-08-02 ENCOUNTER — Encounter: Payer: Self-pay | Admitting: Internal Medicine

## 2012-08-02 LAB — MICROALBUMIN / CREATININE URINE RATIO
Creatinine, Urine: 65.8 mg/dL
Microalb Creat Ratio: 7.8 mg/g (ref 0.0–30.0)
Microalb, Ur: 0.51 mg/dL (ref 0.00–1.89)

## 2012-09-02 ENCOUNTER — Ambulatory Visit: Payer: BC Managed Care – PPO | Admitting: Internal Medicine

## 2012-09-05 ENCOUNTER — Ambulatory Visit: Payer: BC Managed Care – PPO | Admitting: Internal Medicine

## 2012-09-13 ENCOUNTER — Telehealth: Payer: Self-pay | Admitting: Internal Medicine

## 2012-09-13 NOTE — Telephone Encounter (Signed)
Patient is calling to see if there are samples of Lantus to last her until her appointment

## 2012-09-16 ENCOUNTER — Ambulatory Visit: Payer: BC Managed Care – PPO | Admitting: Internal Medicine

## 2012-09-17 ENCOUNTER — Telehealth: Payer: Self-pay | Admitting: Internal Medicine

## 2012-09-17 NOTE — Telephone Encounter (Signed)
The patient is requesting samples of Lantus pen.  Please call the patient at [phone].

## 2012-09-17 NOTE — Telephone Encounter (Signed)
Patient notified to call Dr. Elvera Lennox office and speak with nurse to get samples of Lantus.

## 2012-09-24 ENCOUNTER — Ambulatory Visit: Payer: BC Managed Care – PPO | Admitting: Internal Medicine

## 2012-09-26 ENCOUNTER — Encounter (HOSPITAL_COMMUNITY): Payer: Self-pay | Admitting: *Deleted

## 2012-09-26 ENCOUNTER — Inpatient Hospital Stay (HOSPITAL_COMMUNITY)
Admission: EM | Admit: 2012-09-26 | Discharge: 2012-09-28 | DRG: 295 | Disposition: A | Discharge status: Home / Self Care | Payer: BC Managed Care – PPO | Attending: Internal Medicine | Admitting: Internal Medicine

## 2012-09-26 ENCOUNTER — Emergency Department (HOSPITAL_COMMUNITY): Payer: BC Managed Care – PPO

## 2012-09-26 DIAGNOSIS — Z885 Allergy status to narcotic agent status: Secondary | ICD-10-CM

## 2012-09-26 DIAGNOSIS — E109 Type 1 diabetes mellitus without complications: Secondary | ICD-10-CM

## 2012-09-26 DIAGNOSIS — F329 Major depressive disorder, single episode, unspecified: Secondary | ICD-10-CM | POA: Diagnosis present

## 2012-09-26 DIAGNOSIS — Z9119 Patient's noncompliance with other medical treatment and regimen: Secondary | ICD-10-CM

## 2012-09-26 DIAGNOSIS — E875 Hyperkalemia: Secondary | ICD-10-CM

## 2012-09-26 DIAGNOSIS — Z91199 Patient's noncompliance with other medical treatment and regimen due to unspecified reason: Secondary | ICD-10-CM

## 2012-09-26 DIAGNOSIS — Z794 Long term (current) use of insulin: Secondary | ICD-10-CM

## 2012-09-26 DIAGNOSIS — E876 Hypokalemia: Secondary | ICD-10-CM | POA: Diagnosis not present

## 2012-09-26 DIAGNOSIS — E871 Hypo-osmolality and hyponatremia: Secondary | ICD-10-CM

## 2012-09-26 DIAGNOSIS — Z79899 Other long term (current) drug therapy: Secondary | ICD-10-CM

## 2012-09-26 DIAGNOSIS — J309 Allergic rhinitis, unspecified: Secondary | ICD-10-CM | POA: Diagnosis present

## 2012-09-26 DIAGNOSIS — F3289 Other specified depressive episodes: Secondary | ICD-10-CM | POA: Diagnosis present

## 2012-09-26 DIAGNOSIS — D649 Anemia, unspecified: Secondary | ICD-10-CM | POA: Diagnosis present

## 2012-09-26 DIAGNOSIS — Z9089 Acquired absence of other organs: Secondary | ICD-10-CM

## 2012-09-26 DIAGNOSIS — E1110 Type 2 diabetes mellitus with ketoacidosis without coma: Secondary | ICD-10-CM

## 2012-09-26 DIAGNOSIS — E1010 Type 1 diabetes mellitus with ketoacidosis without coma: Principal | ICD-10-CM | POA: Diagnosis present

## 2012-09-26 DIAGNOSIS — E860 Dehydration: Secondary | ICD-10-CM | POA: Diagnosis present

## 2012-09-26 DIAGNOSIS — F32A Depression, unspecified: Secondary | ICD-10-CM

## 2012-09-26 LAB — CBC WITH DIFFERENTIAL/PLATELET
Basophils Absolute: 0 10*3/uL (ref 0.0–0.1)
Basophils Relative: 0 % (ref 0–1)
Eosinophils Relative: 0 % (ref 0–5)
HCT: 35.7 % — ABNORMAL LOW (ref 36.0–46.0)
MCHC: 34.7 g/dL (ref 30.0–36.0)
Monocytes Absolute: 0.7 10*3/uL (ref 0.1–1.0)
Neutro Abs: 6.9 10*3/uL (ref 1.7–7.7)
RDW: 12 % (ref 11.5–15.5)

## 2012-09-26 LAB — BLOOD GAS, VENOUS
Acid-base deficit: 11.3 mmol/L — ABNORMAL HIGH (ref 0.0–2.0)
Bicarbonate: 14.2 mEq/L — ABNORMAL LOW (ref 20.0–24.0)
O2 Saturation: 43.8 %
Patient temperature: 98.6
TCO2: 13.1 mmol/L (ref 0–100)

## 2012-09-26 LAB — COMPREHENSIVE METABOLIC PANEL
ALT: 28 U/L (ref 0–35)
Alkaline Phosphatase: 101 U/L (ref 39–117)
CO2: 10 mEq/L — CL (ref 19–32)
Chloride: 78 mEq/L — ABNORMAL LOW (ref 96–112)
GFR calc Af Amer: >90 mL/min (ref >90)
Glucose, Bld: 941 mg/dL (ref 70–99)
Potassium: 5.5 mEq/L — ABNORMAL HIGH (ref 3.5–5.1)
Sodium: 116 mEq/L — CL (ref 135–145)
Total Protein: 7.3 g/dL (ref 6.0–8.3)

## 2012-09-26 LAB — BASIC METABOLIC PANEL
BUN: 14 mg/dL (ref 6–23)
BUN: 12 mg/dL (ref 6–23)
CO2: 14 mEq/L — ABNORMAL LOW (ref 19–32)
Chloride: 72 mEq/L — ABNORMAL LOW (ref 96–112)
Chloride: 96 mEq/L (ref 96–112)
Creatinine, Ser: 0.71 mg/dL (ref 0.50–1.10)
Creatinine, Ser: 0.63 mg/dL (ref 0.50–1.10)
GFR calc Af Amer: >90 mL/min (ref >90)
GFR calc non Af Amer: >90 mL/min (ref >90)
Glucose, Bld: 1063 mg/dL (ref 70–99)
Glucose, Bld: 348 mg/dL — ABNORMAL HIGH (ref 70–99)
Potassium: 3.2 mEq/L — ABNORMAL LOW (ref 3.5–5.1)

## 2012-09-26 LAB — URINALYSIS, ROUTINE W REFLEX MICROSCOPIC
Glucose, UA: >1000 mg/dL — AB
Hgb urine dipstick: NEGATIVE
Ketones, ur: >80 mg/dL — AB
Protein, ur: NEGATIVE mg/dL
Urobilinogen, UA: 0.2 mg/dL (ref 0.0–1.0)

## 2012-09-26 LAB — URINE MICROSCOPIC-ADD ON

## 2012-09-26 LAB — GLUCOSE, CAPILLARY
Glucose-Capillary: 243 mg/dL — ABNORMAL HIGH (ref 70–99)
Glucose-Capillary: 528 mg/dL — ABNORMAL HIGH (ref 70–99)
Glucose-Capillary: >600 mg/dL (ref 70–99)
Glucose-Capillary: >600 mg/dL (ref 70–99)

## 2012-09-26 MED ORDER — DEXTROSE-NACL 5-0.45 % IV SOLN: INTRAVENOUS | Status: DC

## 2012-09-26 MED ORDER — SODIUM CHLORIDE 0.9 % IV SOLN
INTRAVENOUS | Status: DC
  Administered 2012-09-27: via INTRAVENOUS

## 2012-09-26 MED ORDER — DEXTROSE 50 % IV SOLN: 25.0000 mL | INTRAVENOUS | Status: DC | PRN

## 2012-09-26 MED ORDER — DEXTROSE-NACL 5-0.45 % IV SOLN
INTRAVENOUS | Status: DC
  Administered 2012-09-26: 22:00:00 via INTRAVENOUS

## 2012-09-26 MED ORDER — SODIUM CHLORIDE 0.9 % IV BOLUS (SEPSIS)
1000.0000 mL | Freq: Once | INTRAVENOUS | Status: AC
  Administered 2012-09-26: 1000 mL via INTRAVENOUS

## 2012-09-26 MED ORDER — SODIUM CHLORIDE 0.9 % IV SOLN
1000.0000 mL | Freq: Once | INTRAVENOUS | Status: AC
  Administered 2012-09-26: 1000 mL via INTRAVENOUS

## 2012-09-26 MED ORDER — SODIUM CHLORIDE 0.9 % IV SOLN
INTRAVENOUS | Status: DC
  Administered 2012-09-26: 23:00:00 via INTRAVENOUS
  Filled 2012-09-26: qty 1

## 2012-09-26 MED ORDER — SODIUM CHLORIDE 0.9 % IV SOLN
1000.0000 mL | INTRAVENOUS | Status: DC
  Administered 2012-09-26: 1000 mL via INTRAVENOUS

## 2012-09-26 MED ORDER — SODIUM CHLORIDE 0.9 % IV SOLN
INTRAVENOUS | Status: DC
  Administered 2012-09-26: 5.4 [IU]/h via INTRAVENOUS
  Administered 2012-09-26: 14 [IU]/h via INTRAVENOUS
  Filled 2012-09-26: qty 1

## 2012-09-26 MED ORDER — ONDANSETRON HCL 4 MG/2ML IJ SOLN: 4.0000 mg | Freq: Four times a day (QID) | INTRAMUSCULAR | Status: DC | PRN

## 2012-09-26 MED ORDER — ENOXAPARIN SODIUM 40 MG/0.4ML ~~LOC~~ SOLN
40.0000 mg | Freq: Every day | SUBCUTANEOUS | Status: DC
  Administered 2012-09-26 – 2012-09-27 (×2): 40 mg via SUBCUTANEOUS
  Filled 2012-09-26 (×3): qty 0.4

## 2012-09-26 MED ORDER — SODIUM CHLORIDE 0.9 % IV SOLN: INTRAVENOUS | Status: AC

## 2012-09-26 MED ORDER — ONDANSETRON HCL 4 MG/2ML IJ SOLN
4.0000 mg | Freq: Once | INTRAMUSCULAR | Status: AC
  Administered 2012-09-26: 4 mg via INTRAVENOUS
  Filled 2012-09-26: qty 2

## 2012-09-26 MED ORDER — DULOXETINE HCL 20 MG PO CPEP
20.0000 mg | ORAL_CAPSULE | Freq: Every day | ORAL | Status: DC
  Administered 2012-09-27 – 2012-09-28 (×2): 20 mg via ORAL
  Filled 2012-09-26 (×2): qty 1

## 2012-09-26 NOTE — ED Notes (Signed)
Dr Pickering informed.  

## 2012-09-26 NOTE — H&P (Signed)
[facility] History and Physical  JINX GILDEN ZOX:[id number] DOB: 12-24-1980 DOA: 09/26/2012  Referring physician: Dr. Benjiman Core, EDP PCP: Carlus Pavlov, MD  Specialists: None  Chief Complaint: High blood sugars, frequent urination, thirsty and weakness.  HPI: [name] is a 32 y.o. female with history of brittle Type 1 DM, recurrent DKA, depression presents to the ED on 1/9 with complaints of high blood sugars, thirst, frequency of urination, drowsiness and generalized weakness. She indicates that her blood sugars were reasonably controlled until yesterday (120 mg/dL or less). She does have periodic hypoglycemia and last one was 4-5 nights ago. Today she noticed blood sugar of 400 mg/dL at lunch and then subsequently her glucometer was unable to read. She claims compliance to her insulins. She can't recollect any reasons for her uncontrolled blood sugar except mental stress lately. In the ED, blood sugar 1063, sodium 112, potassium 5.2, pH 7.274 on venous blood gas. Patient has been bolused with 2 L of normal saline and started on insulin drip. Hospitalist service is requested to admit for further evaluation and management.  Review of Systems: All systems reviewed and apart from history of presenting illness, are negative. Denies suicidal or homicidal ideations.  Past Medical History  Diagnosis Date  . Diabetes mellitus   . Chicken pox   . Depression   . Allergy     hayfever  . Migraines   . UTI (urinary tract infection)    Past Surgical History  Procedure Date  . Tonsillectomy   . Cesarean section   . Chiari malformation repair    Social History:  reports that she has never smoked. She has never used smokeless tobacco. She reports that she does not drink alcohol or use illicit drugs. Married. Has a 73-year-old child. Independent of activities of daily living.  Allergies  Allergen Reactions  . Codeine Nausea And Vomiting    Family History  Problem  Relation Age of Onset  . Heart failure Other   . Drug abuse Other   . Asthma Mother   . Asthma Father   . Drug abuse Maternal Grandmother   . Drug abuse Maternal Grandfather   . Drug abuse Paternal Grandmother   . Drug abuse Paternal Grandfather     Prior to Admission medications   Medication Sig Start Date End Date Taking? Authorizing Provider  DULoxetine (CYMBALTA) 20 MG capsule Take 20 mg by mouth daily.    Yes Historical Provider, MD  insulin aspart (NOVOLOG) 100 UNIT/ML injection Inject into the skin 3 (three) times daily before meals. Sliding scale.   Yes Historical Provider, MD  insulin glargine (LANTUS) 100 UNIT/ML injection Inject 20 Units into the skin 2 (two) times daily.   Yes Historical Provider, MD  Multiple Vitamin (MULTIVITAMIN WITH MINERALS) TABS Take 1 tablet by mouth daily.   Yes Historical Provider, MD  glucagon (GLUCAGON EMERGENCY) 1 MG injection Inject 1 mg into the muscle once as needed. 08/01/12   Carlus Pavlov, MD   Physical Exam: Filed Vitals:   09/26/12 1536  BP: 119/78  Pulse: 111  Temp: 98.4 F (36.9 C)  TempSrc: Oral  Resp: 20  SpO2: 100%   Patient was examined with a female nurse chaperone in the room.  General exam: Moderately built and nourished young female patient lying comfortably supine on the gurney and is in no obvious distress.  Head, eyes and ENT: Nontraumatic and normocephalic. Pupils equally reacting to light and accommodation. Mucosa dry.  Lymphatics: No lymphadenopathy.  Neck: Supple.  No JVD, carotid bruit or thyromegaly.  Respiratory system: Clear to auscultation. No increased work of breathing.  Cardiovascular system: First and second heart sounds heard, regular and mildly tachycardic. No murmurs, gallops or pedal edema.  Gastrointestinal system: Abdomen is nondistended, soft and nontender. Normal bowel sounds heard. No organomegaly or masses appreciated.  Central nervous system: Alert and oriented. No focal neurological  deficits.  Extremities: Symmetric 5 x 5 power. Peripheral pulses symmetrically felt.  Skin: No acute findings.  The skin is little system: Negative exam.  Psychiatry: Pleasant and cooperative.  Labs on Admission:  Basic Metabolic Panel:  Lab 09/26/12 1610  NA 112*  K 5.2*  CL 72*  CO2 14*  GLUCOSE 1063*  BUN 14  CREATININE 0.71  CALCIUM 9.3  MG --  PHOS --   Liver Function Tests: No results found for this basename: AST:5,ALT:5,ALKPHOS:5,BILITOT:5,PROT:5,ALBUMIN:5 in the last 168 hours No results found for this basename: LIPASE:5,AMYLASE:5 in the last 168 hours No results found for this basename: AMMONIA:5 in the last 168 hours CBC:  Lab 09/26/12 1813  WBC 9.4  NEUTROABS 6.9  HGB 12.4  HCT 35.7*  MCV 87.1  PLT 367   Cardiac Enzymes: No results found for this basename: CKTOTAL:5,CKMB:5,CKMBINDEX:5,TROPONINI:5 in the last 168 hours  BNP (last 3 results) No results found for this basename: PROBNP:3 in the last 8760 hours CBG:  Lab 09/26/12 1544  GLUCAP >600*    Radiological Exams on Admission: Dg Chest 2 View  09/26/2012  *RADIOLOGY REPORT*  Clinical Data: Diabetic ketoacidosis, weakness and cough.  CHEST - 2 VIEW  Comparison: 07/27/2012.  Findings: Trachea is midline.  Heart size normal.  Lungs are clear. No pleural fluid.  IMPRESSION: No acute findings.   Original Report Authenticated By: Leanna Battles, M.D.      Assessment/Plan Active Problems:  DKA (diabetic ketoacidoses)  Hyperkalemia  Hyponatremia  DM (diabetes mellitus), type 1  Depression  Dehydration  DKA in brittle type 1 diabetes Admit to step down. Aggressive IV fluid hydration and initiate DKA protocol. When DKA has resolved, transition to home doses of insulin and titrate. Check hemoglobin A1c.  Dehydration Secondary to problem #1. IV fluids.  Hyponatremia Combination of dehydration and pseudohyponatremia from hyperglycemia. Management as above. Follow up BMP closely.  Mild  hyperkalemia Treat for DKA and follow BMP closely.  Depression Continue home medications.    Code Status: Full Family Communication: Discussed with patient Disposition Plan: At least 2 midnights stay. Home when medically stable.  Time spent: 55 minutes  [facility] Pager [phone]  If 7PM-7AM, please contact night-coverage [email or web address] Password [facility] 09/26/2012, 6:54 PM

## 2012-09-26 NOTE — ED Notes (Signed)
Pt reports elevated BG today.  Pt reports eating pancake with sugar free syrup for breakfast.  Pt is on SSI.  Pt reports nausea, polydipsia and urinary frequency.

## 2012-09-26 NOTE — ED Notes (Signed)
Critical Value Sodium 116  Co2 10  Glucose 941.

## 2012-09-26 NOTE — ED Notes (Signed)
Called to give report to floor. Awaiting call back from RN.

## 2012-09-26 NOTE — ED Provider Notes (Signed)
History     CSN: [id number]  Arrival date & time 09/26/12  1531   First MD Initiated Contact with Patient 09/26/12 1549      Chief Complaint  Patient presents with  . Hyperglycemia  . Nausea  . Polydipsia  . Urinary Frequency    (Consider location/radiation/quality/duration/timing/severity/associated sxs/prior treatment) Patient is a 32 y.o. female presenting with frequency. The history is provided by the patient.  Urinary Frequency Pertinent negatives include no chest pain, no abdominal pain, no headaches and no shortness of breath.   patient presents with elevated blood sugars. Initial CBG here was over 600. She states they have been only elevated today and were in the 400s at home. She denies change in her medications. She states that she feels bad all over and has been urinating frequently. She states she's and tertiary also. She states that she's been under a lot of stress. No chest pain. No cough. No confusion. She does not think that she is pregnant, however she states her periods are chronically irregular.. She's had nausea  Past Medical History  Diagnosis Date  . Diabetes mellitus   . Chicken pox   . Depression   . Allergy     hayfever  . Migraines   . UTI (urinary tract infection)     Past Surgical History  Procedure Date  . Tonsillectomy   . Cesarean section   . Chiari malformation repair     Family History  Problem Relation Age of Onset  . Heart failure Other   . Drug abuse Other   . Asthma Mother   . Asthma Father   . Drug abuse Maternal Grandmother   . Drug abuse Maternal Grandfather   . Drug abuse Paternal Grandmother   . Drug abuse Paternal Grandfather     History  Substance Use Topics  . Smoking status: Never Smoker   . Smokeless tobacco: Never Used  . Alcohol Use: No    OB History    Grav Para Term Preterm Abortions TAB SAB Ect Mult Living                  Review of Systems  Constitutional: Positive for fatigue. Negative for activity  change and appetite change.  HENT: Negative for neck stiffness.   Eyes: Negative for pain.  Respiratory: Negative for chest tightness and shortness of breath.   Cardiovascular: Negative for chest pain and leg swelling.  Gastrointestinal: Negative for nausea, vomiting, abdominal pain and diarrhea.  Genitourinary: Positive for frequency. Negative for flank pain.  Musculoskeletal: Negative for back pain.  Skin: Negative for rash.  Neurological: Negative for weakness, numbness and headaches.  Psychiatric/Behavioral: Negative for behavioral problems.    Allergies  Codeine  Home Medications   No current outpatient prescriptions on file.  BP 93/51  Pulse 110  Temp 98.2 F (36.8 C) (Oral)  Resp 20  Ht 5' 0.4" (1.534 m)  Wt 131 lb 13.4 oz (59.8 kg)  BMI 25.41 kg/m2  SpO2 99%  LMP 09/12/2012  Physical Exam  Nursing note and vitals reviewed. Constitutional: She is oriented to person, place, and time. She appears well-developed and well-nourished.  HENT:  Head: Normocephalic and atraumatic.  Eyes: EOM are normal. Pupils are equal, round, and reactive to light.  Neck: Normal range of motion. Neck supple.  Cardiovascular: Regular rhythm and normal heart sounds.   No murmur heard.      Mild tachycardia  Pulmonary/Chest: Effort normal and breath sounds normal. No respiratory distress. She has  no wheezes. She has no rales.  Abdominal: Soft. Bowel sounds are normal. She exhibits no distension. There is no tenderness. There is no rebound and no guarding.  Musculoskeletal: Normal range of motion.  Neurological: She is alert and oriented to person, place, and time. No cranial nerve deficit.  Skin: Skin is warm and dry.  Psychiatric: She has a normal mood and affect. Her speech is normal.    ED Course  Procedures (including critical care time)  Labs Reviewed  GLUCOSE, CAPILLARY - Abnormal; Notable for the following:    Glucose-Capillary >600 (*)     All other components within normal  limits  URINALYSIS, ROUTINE W REFLEX MICROSCOPIC - Abnormal; Notable for the following:    Glucose, UA >1000 (*)     Ketones, ur >80 (*)     All other components within normal limits  BASIC METABOLIC PANEL - Abnormal; Notable for the following:    Sodium 112 (*)     Potassium 5.2 (*)     Chloride 72 (*)     CO2 14 (*)     Glucose, Bld 1063 (*)     All other components within normal limits  BLOOD GAS, VENOUS - Abnormal; Notable for the following:    pCO2, Ven 31.7 (*)     pO2, Ven 27.4 (*)     Bicarbonate 14.2 (*)     Acid-base deficit 11.3 (*)     All other components within normal limits  COMPREHENSIVE METABOLIC PANEL - Abnormal; Notable for the following:    Sodium 116 (*)     Potassium 5.5 (*)     Chloride 78 (*)  DELTA CHECK NOTED   CO2 10 (*)     Glucose, Bld 941 (*)     All other components within normal limits  CBC WITH DIFFERENTIAL - Abnormal; Notable for the following:    HCT 35.7 (*)     All other components within normal limits  GLUCOSE, CAPILLARY - Abnormal; Notable for the following:    Glucose-Capillary >600 (*)     All other components within normal limits  GLUCOSE, CAPILLARY - Abnormal; Notable for the following:    Glucose-Capillary 528 (*)     All other components within normal limits  BASIC METABOLIC PANEL - Abnormal; Notable for the following:    Sodium 128 (*)     Potassium 3.2 (*)     Glucose, Bld 348 (*)     All other components within normal limits  GLUCOSE, CAPILLARY - Abnormal; Notable for the following:    Glucose-Capillary 180 (*)     All other components within normal limits  GLUCOSE, CAPILLARY - Abnormal; Notable for the following:    Glucose-Capillary 451 (*)     All other components within normal limits  GLUCOSE, CAPILLARY - Abnormal; Notable for the following:    Glucose-Capillary 243 (*)     All other components within normal limits  PREGNANCY, URINE  URINE MICROSCOPIC-ADD ON  MRSA PCR SCREENING  HEMOGLOBIN A1C  CBC   Dg Chest 2  View  09/26/2012  *RADIOLOGY REPORT*  Clinical Data: Diabetic ketoacidosis, weakness and cough.  CHEST - 2 VIEW  Comparison: 07/27/2012.  Findings: Trachea is midline.  Heart size normal.  Lungs are clear. No pleural fluid.  IMPRESSION: No acute findings.   Original Report Authenticated By: Leanna Battles, M.D.      1. DKA (diabetic ketoacidoses)   2. Dehydration   3. Hyponatremia   4. Hyperkalemia  CRITICAL CARE Performed by: Billee Cashing   Total critical care time: 30  Critical care time was exclusive of separately billable procedures and treating other patients.  Critical care was necessary to treat or prevent imminent or life-threatening deterioration.  Critical care was time spent personally by me on the following activities: development of treatment plan with patient and/or surrogate as well as nursing, discussions with consultants, evaluation of patient's response to treatment, examination of patient, obtaining history from patient or surrogate, ordering and performing treatments and interventions, ordering and review of laboratory studies, ordering and review of radiographic studies, pulse oximetry and re-evaluation of patient's condition.  MDM  Patient presents with hyperglycemia and generally feeling bad. Found to be in DKA with a sugar of 1000 and bicarbonate of 14. Her blood pressure had been maintained. She not have a fever. She'll be admitted to step down unit. She was started on IV insulin and given 2 L of fluid bolus.        Juliet Rude. Rubin Payor, MD 09/27/12 1610

## 2012-09-27 LAB — BASIC METABOLIC PANEL
BUN: 12 mg/dL (ref 6–23)
Calcium: 8.6 mg/dL (ref 8.4–10.5)
GFR calc Af Amer: >90 mL/min (ref >90)
GFR calc non Af Amer: >90 mL/min (ref >90)
Glucose, Bld: 106 mg/dL — ABNORMAL HIGH (ref 70–99)
Potassium: 3.6 mEq/L (ref 3.5–5.1)

## 2012-09-27 LAB — CBC
MCH: 30 pg (ref 26.0–34.0)
MCHC: 36.1 g/dL — ABNORMAL HIGH (ref 30.0–36.0)
RDW: 11.9 % (ref 11.5–15.5)

## 2012-09-27 LAB — GLUCOSE, CAPILLARY
Glucose-Capillary: 119 mg/dL — ABNORMAL HIGH (ref 70–99)
Glucose-Capillary: 155 mg/dL — ABNORMAL HIGH (ref 70–99)
Glucose-Capillary: 339 mg/dL — ABNORMAL HIGH (ref 70–99)

## 2012-09-27 LAB — HEMOGLOBIN A1C
Hgb A1c MFr Bld: 12.2 % — ABNORMAL HIGH (ref <5.7)
Mean Plasma Glucose: 303 mg/dL — ABNORMAL HIGH (ref <117)

## 2012-09-27 MED ORDER — INSULIN GLARGINE 100 UNIT/ML ~~LOC~~ SOLN
10.0000 [IU] | Freq: Every day | SUBCUTANEOUS | Status: DC
  Administered 2012-09-27: 10 [IU] via SUBCUTANEOUS

## 2012-09-27 MED ORDER — ADULT MULTIVITAMIN W/MINERALS CH
1.0000 | ORAL_TABLET | Freq: Every day | ORAL | Status: DC
  Administered 2012-09-27 – 2012-09-28 (×2): 1 via ORAL
  Filled 2012-09-27 (×2): qty 1

## 2012-09-27 MED ORDER — INSULIN ASPART 100 UNIT/ML ~~LOC~~ SOLN
8.0000 [IU] | Freq: Three times a day (TID) | SUBCUTANEOUS | Status: DC
  Administered 2012-09-27 – 2012-09-28 (×3): 8 [IU] via SUBCUTANEOUS

## 2012-09-27 MED ORDER — INSULIN GLARGINE 100 UNIT/ML ~~LOC~~ SOLN: 10.0000 [IU] | Freq: Every day | SUBCUTANEOUS | Status: DC

## 2012-09-27 MED ORDER — POTASSIUM CHLORIDE 10 MEQ/100ML IV SOLN
10.0000 meq | INTRAVENOUS | Status: AC
  Administered 2012-09-27 (×2): 10 meq via INTRAVENOUS
  Filled 2012-09-27 (×2): qty 100

## 2012-09-27 MED ORDER — INSULIN ASPART 100 UNIT/ML ~~LOC~~ SOLN
0.0000 [IU] | Freq: Three times a day (TID) | SUBCUTANEOUS | Status: DC
  Administered 2012-09-27: 15 [IU] via SUBCUTANEOUS
  Administered 2012-09-27: 5 [IU] via SUBCUTANEOUS
  Administered 2012-09-27: 2 [IU] via SUBCUTANEOUS
  Administered 2012-09-28: 8 [IU] via SUBCUTANEOUS
  Administered 2012-09-28: 3 [IU] via SUBCUTANEOUS

## 2012-09-27 MED ORDER — INSULIN ASPART 100 UNIT/ML ~~LOC~~ SOLN
0.0000 [IU] | Freq: Every day | SUBCUTANEOUS | Status: DC
  Administered 2012-09-27: 2 [IU] via SUBCUTANEOUS

## 2012-09-27 MED ORDER — INSULIN GLARGINE 100 UNIT/ML ~~LOC~~ SOLN
20.0000 [IU] | Freq: Every day | SUBCUTANEOUS | Status: DC
  Administered 2012-09-28: 20 [IU] via SUBCUTANEOUS

## 2012-09-27 MED ORDER — INSULIN GLARGINE 100 UNIT/ML ~~LOC~~ SOLN
20.0000 [IU] | Freq: Two times a day (BID) | SUBCUTANEOUS | Status: DC
  Administered 2012-09-27: 20 [IU] via SUBCUTANEOUS

## 2012-09-27 MED ORDER — ACETAMINOPHEN 325 MG PO TABS
650.0000 mg | ORAL_TABLET | Freq: Four times a day (QID) | ORAL | Status: DC | PRN
  Administered 2012-09-27: 650 mg via ORAL
  Filled 2012-09-27: qty 2

## 2012-09-27 NOTE — Progress Notes (Addendum)
Pt continued on glucostabilizer. DKA management in Adults flowsheet placed to chart front. Pt with c/o of headache and LLE muscle cramp. Call out to covering Doctor for prn. See Medication Mar prn Tylenol given per M.D. Order. Will continue to monitor patient per Doctor order and unit protocol.   Pt with four consecutive CBGs within range per DKA mangement in Adults protocol. Call out to covering M.D. New orders given to give Lantus 20 units SQ now, Continue D5 1/2 NS IVF's  At 100cc/hr. And maintain glucostabilizer., call Doctor  Back in 2 hours and determination will be made concerning diet and sliding scale.

## 2012-09-27 NOTE — Progress Notes (Signed)
Hypokalemia   K repalced 

## 2012-09-27 NOTE — Progress Notes (Signed)
Inpatient Diabetes Program Recommendations  AACE/ADA: New Consensus Statement on Inpatient Glycemic Control (2013)  Target Ranges:  Prepandial:   less than 140 mg/dL      Peak postprandial:   less than 180 mg/dL (1-2 hours)      Critically ill patients:  140 - 180 mg/dL   Reason for Visit: DKA  y.o. female with history of brittle Type 1 DM, recurrent DKA, depression presents to the ED on 1/9 with complaints of high blood sugars, thirst, frequency of urination, drowsiness and generalized weakness. She indicates that her blood sugars were reasonably controlled until yesterday (120 mg/dL or less). She does have periodic hypoglycemia and last one was 4-5 nights ago. Today she noticed blood sugar of 400 mg/dL at lunch and then subsequently her glucometer was unable to read. She claims compliance to her insulins. She can't recollect any reasons for her uncontrolled blood sugar except mental stress lately. In the ED, blood sugar 1063, sodium 112, potassium 5.2, pH 7.274 on venous blood gas. Patient has been bolused with 2 L of normal saline and started on insulin drip.   GlucoStabilizer discontinued after Lantus 20 units was given.   Pt states she takes Lantus 20 units bid and Novolog 10 units tidwc at home.  Always takes Lantus, but knows she needs more rapid-acting insulin for CHO coverage, but she tries to make it last longer because she doesn't have money to pay her copay.  Mother buys strips for glucose meter, but pt hates to ask her for money to purchase insulin.  Has [employer] insurance through her husband, but has not called them to inquire about why insulin is so expensive.  States she knows she should contact them but hates to deal with insurance.   Results for [name], [name] (MRN [id number]) as of 09/27/2012 16:06  Ref. Range 09/27/2012 03:35  Sodium Latest Range: 135-145 mEq/L 130 (L)  Potassium Latest Range: 3.5-5.1 mEq/L 3.6  Chloride Latest Range: 96-112 mEq/L 97  CO2 Latest Range: 19-32  mEq/L 22  BUN Latest Range: 6-23 mg/dL 12  Creatinine Latest Range: 0.50-1.10 mg/dL 4.09  Calcium Latest Range: 8.4-10.5 mg/dL 8.6  GFR calc non Af Amer Latest Range: >90 mL/min >90  GFR calc Af Amer Latest Range: >90 mL/min >90  Glucose Latest Range: 70-99 mg/dL 811 (H)  Results for PRESLIE, DEPASQUALE (MRN [id number]) as of 09/27/2012 16:06  Ref. Range 09/27/2012 04:04 09/27/2012 06:12 09/27/2012 07:51 09/27/2012 11:24 09/27/2012 13:07  Glucose-Capillary Latest Range: 70-99 mg/dL 97 213 (H) 086 (H) 578 (H) 339 (H)     Inpatient Diabetes Program Recommendations Insulin - Basal: Increase Lantus to 20 units bid  Has appt with Dr. Elvera Lennox on Monday.  Had info from case management regarding [email or web address] and Novo med assistance.  Will give [employer] regarding Lantus med assistance.  Discussed importance of taking insulin as prescribed and f/u with endo.  Discussed with RN.  Will follow.  Thank you. Ailene Ards, RD, LDN, CDE Inpatient Diabetes Coordinator [phone]

## 2012-09-27 NOTE — Progress Notes (Signed)
CARE MANAGEMENT NOTE 09/27/2012  Patient:  [name], [name]   Account Number:  [id number]  Date Initiated:  09/27/2012  Documentation initiated by:  Quenton Recendez  Subjective/Objective Assessment:   patient with hx of diabetes, could not afford her insulin and had been spacing out her doses.  admitted with dka     Action/Plan:   lives at home   Anticipated DC Date:  09/30/2012   Anticipated DC Plan:  HOME/SELF CARE  In-house referral  NA      DC Planning Services  CM consult  Medication Assistance      Choice offered to / List presented to:  C-1 Patient           Status of service:  In process, will continue to follow Medicare Important Message given?  NA - LOS <3 / Initial given by admissions (If response is "NO", the following Medicare IM given date fields will be blank) Date Medicare IM given:   Date Additional Medicare IM given:    Discharge Disposition:    Per UR Regulation:  Reviewed for med. necessity/level of care/duration of stay  If discussed at Long Length of Stay Meetings, dates discussed:    Comments:  [id number]/Aaryanna Hyden Earlene Plater, RN, BSN, CCM: CHART REVIEWED AND UPDATED.  Next chart review due on [id number]. patient has [employer] blue [employer] insurance state that she is still unable to buy her insulin.  Information for patient assistance from the [employer] comapny given to patient, and information and pharmacy card through [email or web address] also to patient. CASE MANAGEMENT [phone]

## 2012-09-27 NOTE — Progress Notes (Signed)
INITIAL NUTRITION ASSESSMENT  DOCUMENTATION CODES Per approved criteria  -Not Applicable   INTERVENTION: 1.  General healthful diet; encourage intake.  Pt working with case management r/t to medication needs.    NUTRITION DIAGNOSIS: Unintended wt loss related to poor glucose control as evidenced by elevated HgBA1C, 30 lbs wt loss.   Monitor:  1.  Food/Beverage; adequate intake to meet >/=90% estimated needs 2.  Wt/wt change; deter unintentional loss 3.  Blood glucose; adequate control, CBGs 80-150 mg/dL  Reason for Assessment: MST  32 y.o. female  Admitting Dx: DKA  ASSESSMENT: Pt admitted with DKA.  Pt with h/o wt loss- 30 lbs in the past year likely related to poor glucose control (18% usual wt over the past year).  Elevated HgBA1C.  Lab Results  Component Value Date   HGBA1C 12.2* 09/26/2012   Pt endorses "rationing" her insulin to make it last due to cost.  Care management working with pt to improve affordability.    RD met with pt to discuss glucose levels and HgBA1C.  Pt states adequate knowledge of food, carbohydrate-containing foods, and carbohydrate counting.  Pt states 'I'm good at counting carbs, I just need more insulin."  Height: Ht Readings from Last 1 Encounters:  09/26/12 5' 0.4" (1.534 m)    Weight: Wt Readings from Last 1 Encounters:  09/26/12 131 lb 13.4 oz (59.8 kg)    Ideal Body Weight: 100 lbs  % Ideal Body Weight: 130%  Wt Readings from Last 10 Encounters:  09/26/12 131 lb 13.4 oz (59.8 kg)  08/01/12 145 lb (65.772 kg)  07/27/12 139 lb 1.8 oz (63.1 kg)  12/19/11 148 lb (67.132 kg)  09/24/11 158 lb 8.2 oz (71.9 kg)    Usual Body Weight: 160 lbs in 09/2011  % Usual Body Weight: 82%  BMI:  Body mass index is 25.41 kg/(m^2).  Estimated Nutritional Needs: Kcal: 1700-1900 Protein: 60-70g Fluid: >2.0 L/day  Skin: intact  Diet Order: Carb Control  EDUCATION NEEDS: -Education needs addressed   Intake/Output Summary (Last 24 hours)  at 09/27/12 1130 Last data filed at 09/27/12 0500  Gross per 24 hour  Intake    730 ml  Output   1150 ml  Net   -420 ml    Last BM: 1/9  Labs:   Lab 09/27/12 0335 09/26/12 2248 09/26/12 1813  NA 130* 128* 116*  K 3.6 3.2* 5.5*  CL 97 96 78*  CO2 22 20 10*  BUN 12 12 16   CREATININE 0.61 0.63 0.67  CALCIUM 8.6 8.9 8.8  MG -- -- --  PHOS -- -- --  GLUCOSE 106* 348* 941*    CBG (last 3)   Basename 09/27/12 0751 09/27/12 0612 09/27/12 0404  GLUCAP 217* 140* 97    Scheduled Meds:   . DULoxetine  20 mg Oral Daily  . enoxaparin (LOVENOX) injection  40 mg Subcutaneous QHS  . insulin aspart  0-15 Units Subcutaneous TID WC  . insulin aspart  0-5 Units Subcutaneous QHS  . insulin aspart  8 Units Subcutaneous TID WC  . insulin glargine  10 Units Subcutaneous QHS  . insulin glargine  20 Units Subcutaneous Daily    Continuous Infusions:   . sodium chloride 75 mL/hr (09/27/12 0640)    Past Medical History  Diagnosis Date  . Diabetes mellitus   . Chicken pox   . Depression   . Allergy     hayfever  . Migraines   . UTI (urinary tract infection)  Past Surgical History  Procedure Date  . Tonsillectomy   . Cesarean section   . Chiari malformation repair     Loyce Dys, MS RD LDN Clinical Inpatient Dietitian Pager: [phone] Weekend/After hours pager: [phone]

## 2012-09-27 NOTE — Progress Notes (Addendum)
[facility] PROGRESS NOTE  [name] WUJ:[id number] DOB: 1981/01/31 DOA: 09/26/2012 PCP: Carlus Pavlov, MD  Brief narrative 32 y.o. female with history of brittle Type 1 DM, recurrent DKA, depression presented to the ED on 1/9 with complaints of high blood sugars, thirst, frequency of urination, drowsiness and generalized weakness. She was found to be in DKA and was admitted to step down unit for further evaluation and management.  Assessment/Plan:  DKA in brittle1 DM/poor compliance with medications Patient was admitted to step down unit. She was aggressively hydrated and started on DKA protocol. Once her acidosis had resolved, she was transitioned to Lantus and NovoLog. Discussed with patient at length on 1/10: Patient indicates that due to financial constraints she rations her insulins-does not take mealtime NovoLog. This is most likely cause of her uncontrolled DM and eventual DKA. Hemoglobin A1c 12.2.  Discussed with Dr. Ernest Haber, her Primary Endocrinologist who indicated that patient is supposed to be on Lantus 20 units in the a.m., 10 units each bedtime, NovoLog 10 units 3 times a day with meals and sliding scale insulin. She recommended starting the same regimen inpatient. She will followup patient on Monday 1/13 (appointment for 10:30 AM) at which time she will consider switching her NovoLog to regular insulin and Lantus to NPH which are more affordable. Patient was agreeable with this plan. Requested case manager to assist with Lantus which she has run out.   May have to make adjustments to her insulins inpatient depending on her CBGs.  Dehydration Improving. Continue IV normal saline for additional 24 hours.  Hyponatremia Secondary to dehydration and pseudohyponatremia. Improving. Continue IV NS for additional 24 hours and followup BMP in a.m.  Hyperkalemia Secondary to DKA. Resolved. Patient then developed hypokalemia which was  repleted.  Depression Continue home medications. Stable  Anemia Possibly chronic. Follow up CBC in a.m.  Code Status: Full Family Communication: Discussed with patient.  Disposition Plan: Transfer to regular medical bed on 1/10. Possible discharge home on 1/11    Consultants:  None   Procedures:  None  Antibiotics:  None.  HPI/Subjective:  Feels much better. Less thirsty and decreased urinary frequency. Feels tired overall.   Objective: Filed Vitals:   09/27/12 0800 09/27/12 0831 09/27/12 1204 09/27/12 1355  BP:  101/62  107/73  Pulse:  94  105  Temp: 98.2 F (36.8 C)  98.1 F (36.7 C)   TempSrc: Oral  Oral   Resp:  16  18  Height:      Weight:      SpO2:  100%  99%    Intake/Output Summary (Last 24 hours) at 09/27/12 1458 Last data filed at 09/27/12 1225  Gross per 24 hour  Intake    970 ml  Output   1150 ml  Net   -180 ml   Filed Weights   09/26/12 2100  Weight: 59.8 kg (131 lb 13.4 oz)    Exam:   General exam: Comfortable.  Respiratory system: Clear to auscultation. No increased work of breathing.  Cardiovascular system: First and second heart sounds heard, regular rate and rhythm. No JVD or murmurs. Telemetry shows sinus rhythm.  Gastrointestinal system: Abdomen is non-distended, soft and nontender. Normal bowel sounds heard.  Central nervous system: Alert and oriented. No focal neurological deficits.   Extremities: Symmetric 5 x 5 power.  Data Reviewed: Basic Metabolic Panel:  Lab 09/27/12 9562 09/26/12 2248 09/26/12 1813 09/26/12 1615  NA 130* 128* 116* 112*  K 3.6 3.2* 5.5* 5.2*  CL 97 96 78* 72*  CO2 22 20 10* 14*  GLUCOSE 106* 348* 941* 1063*  BUN 12 12 16 14   CREATININE 0.61 0.63 0.67 0.71  CALCIUM 8.6 8.9 8.8 9.3  MG -- -- -- --  PHOS -- -- -- --   Liver Function Tests:  Lab 09/26/12 1813  AST 15  ALT 28  ALKPHOS 101  BILITOT 0.7  PROT 7.3  ALBUMIN 3.8   No results found for this basename: LIPASE:5,AMYLASE:5 in  the last 168 hours No results found for this basename: AMMONIA:5 in the last 168 hours CBC:  Lab 09/27/12 0335 09/26/12 1813  WBC 8.4 9.4  NEUTROABS -- 6.9  HGB 11.8* 12.4  HCT 32.7* 35.7*  MCV 83.2 87.1  PLT 368 367   Cardiac Enzymes: No results found for this basename: CKTOTAL:5,CKMB:5,CKMBINDEX:5,TROPONINI:5 in the last 168 hours BNP (last 3 results) No results found for this basename: PROBNP:3 in the last 8760 hours CBG:  Lab 09/27/12 1307 09/27/12 1124 09/27/12 0751 09/27/12 0612 09/27/12 0404  GLUCAP 339* 418* 217* 140* 97    Recent Results (from the past 240 hour(s))  MRSA PCR SCREENING     Status: Normal   Collection Time   09/26/12  9:30 PM      Component Value Range Status Comment   MRSA by PCR NEGATIVE  NEGATIVE Final      Studies: Dg Chest 2 View  09/26/2012  *RADIOLOGY REPORT*  Clinical Data: Diabetic ketoacidosis, weakness and cough.  CHEST - 2 VIEW  Comparison: 07/27/2012.  Findings: Trachea is midline.  Heart size normal.  Lungs are clear. No pleural fluid.  IMPRESSION: No acute findings.   Original Report Authenticated By: Leanna Battles, M.D.     Scheduled Meds:    . DULoxetine  20 mg Oral Daily  . enoxaparin (LOVENOX) injection  40 mg Subcutaneous QHS  . insulin aspart  0-15 Units Subcutaneous TID WC  . insulin aspart  0-5 Units Subcutaneous QHS  . insulin aspart  8 Units Subcutaneous TID WC  . insulin glargine  10 Units Subcutaneous QHS  . insulin glargine  20 Units Subcutaneous Daily  . multivitamin with minerals  1 tablet Oral Daily   Continuous Infusions:    . sodium chloride 75 mL/hr (09/27/12 0640)    Active Problems:  DKA (diabetic ketoacidoses)  Hyperkalemia  Hyponatremia  DM (diabetes mellitus), type 1  Depression  Dehydration    Time spent: 35 mins    [facility]  [facility] Pager [phone]. If 8PM-8AM, please contact night-coverage at [email or web address], password [facility] 09/27/2012, 2:58 PM  LOS: 1 day

## 2012-09-28 DIAGNOSIS — F329 Major depressive disorder, single episode, unspecified: Secondary | ICD-10-CM

## 2012-09-28 DIAGNOSIS — E109 Type 1 diabetes mellitus without complications: Secondary | ICD-10-CM

## 2012-09-28 DIAGNOSIS — F3289 Other specified depressive episodes: Secondary | ICD-10-CM

## 2012-09-28 LAB — BASIC METABOLIC PANEL
BUN: 11 mg/dL (ref 6–23)
Chloride: 101 mEq/L (ref 96–112)
GFR calc Af Amer: >90 mL/min (ref >90)
GFR calc non Af Amer: >90 mL/min (ref >90)
Potassium: 3.1 mEq/L — ABNORMAL LOW (ref 3.5–5.1)
Sodium: 133 mEq/L — ABNORMAL LOW (ref 135–145)

## 2012-09-28 LAB — CBC
MCH: 29.8 pg (ref 26.0–34.0)
MCHC: 35.1 g/dL (ref 30.0–36.0)
MCV: 85 fL (ref 78.0–100.0)
Platelets: 318 10*3/uL (ref 150–400)
RDW: 12.2 % (ref 11.5–15.5)

## 2012-09-28 LAB — GLUCOSE, CAPILLARY
Glucose-Capillary: 232 mg/dL — ABNORMAL HIGH (ref 70–99)
Glucose-Capillary: 252 mg/dL — ABNORMAL HIGH (ref 70–99)
Glucose-Capillary: 172 mg/dL — ABNORMAL HIGH (ref 70–99)

## 2012-09-28 MED ORDER — INSULIN ASPART 100 UNIT/ML ~~LOC~~ SOLN: SUBCUTANEOUS | Status: DC

## 2012-09-28 MED ORDER — INSULIN GLARGINE 100 UNIT/ML ~~LOC~~ SOLN: SUBCUTANEOUS | Status: DC

## 2012-09-28 MED ORDER — INSULIN ASPART 100 UNIT/ML ~~LOC~~ SOLN: 10.0000 [IU] | Freq: Three times a day (TID) | SUBCUTANEOUS | Status: DC

## 2012-09-28 MED ORDER — POTASSIUM CHLORIDE CRYS ER 20 MEQ PO TBCR
40.0000 meq | EXTENDED_RELEASE_TABLET | Freq: Once | ORAL | Status: AC
  Administered 2012-09-28: 40 meq via ORAL
  Filled 2012-09-28: qty 2

## 2012-09-28 MED ORDER — PROMETHAZINE HCL 12.5 MG PO TABS: 12.5000 mg | ORAL_TABLET | Freq: Four times a day (QID) | ORAL | Status: DC | PRN

## 2012-09-28 MED ORDER — VITAMINS A & D EX OINT
TOPICAL_OINTMENT | CUTANEOUS | Status: AC
  Administered 2012-09-28: 11:00:00
  Filled 2012-09-28: qty 5

## 2012-09-28 MED ORDER — INSULIN ASPART 100 UNIT/ML ~~LOC~~ SOLN
10.0000 [IU] | Freq: Three times a day (TID) | SUBCUTANEOUS | Status: DC
  Administered 2012-09-28: 10 [IU] via SUBCUTANEOUS

## 2012-09-28 NOTE — Discharge Summary (Signed)
Physician Discharge Summary  Patient ID: [name] MRN: [id number] DOB/AGE: [birth year] 34 y.o.  Admit date: 09/26/2012 Discharge date: 09/28/2012  Primary Care Physician:  Carlus Pavlov, MD  Discharge Diagnoses:    . DKA (diabetic ketoacidoses) . Hyperkalemia . Hyponatremia . DM (diabetes mellitus), type 1 . Depression . Dehydration  Consults: Diabetic coordinator  Discharge Medications:   Medication List     As of 09/28/2012 12:31 PM    TAKE these medications         DULoxetine 20 MG capsule   Commonly known as: CYMBALTA   Take 20 mg by mouth daily.      glucagon 1 MG injection   Inject 1 mg into the muscle once as needed.      insulin aspart 100 UNIT/ML injection   Commonly known as: novoLOG   Inject 10 Units into the skin 3 (three) times daily before meals.      insulin aspart 100 UNIT/ML injection   Commonly known as: novoLOG   Sliding scale insulin:  CBG < 70: Drink juice; CBG 70 - 120: 0 units: CBG 121 - 150: 2 units; CBG 151 - 200: 3 units; CBG 201 - 250: 5 units; CBG 251 - 300: 8 units;CBG 301 - 350: 11 units; CBG 351 - 400: 15 units; CBG > 400 : 15 units and notify your MD      insulin glargine 100 UNIT/ML injection   Commonly known as: LANTUS   Take Lantus 20 units in the morning and 10 units at bedtime      multivitamin with minerals Tabs   Take 1 tablet by mouth daily.      promethazine 12.5 MG tablet   Commonly known as: PHENERGAN   Take 1 tablet (12.5 mg total) by mouth every 6 (six) hours as needed for nausea.          Brief H and P: For complete details please refer to admission H and P, but in brief [name] is a 32 y.o. female with history of brittle Type 1 DM, recurrent DKA, depression presented to the ED on 1/9 with complaints of high blood sugars, thirst, frequency of urination, drowsiness and generalized weakness. She indicated that her blood sugars were reasonably controlled until yesterday (120 mg/dL or less). She  does have periodic hypoglycemia and last one was 4-5 nights ago. On the day of admission, she noticed blood sugar of 400 mg/dL at lunch and then subsequently her glucometer was unable to read.  In the ED, blood sugar 1063, sodium 112, potassium 5.2, pH 7.274 on venous blood gas.  Hospitalist service is requested to admit for further evaluation and management.   Hospital Course:   DKA in brittle1 DM/poor compliance with medications  Patient was admitted to step down unit. She was aggressively hydrated and started on DKA protocol. Once her acidosis had resolved, she was transitioned to Lantus and NovoLog. Patient indicateed that due to financial constraints she rations her insulins-does not take mealtime NovoLog. This is most likely cause of her uncontrolled DM and eventual DKA. Hemoglobin A1c 12.2.  Dr. Waymon Amato discussed with Dr. Ernest Haber, her Primary Endocrinologist who indicated that patient is supposed to be on Lantus 20 units in the a.m.,10 units each bedtime, NovoLog 10 units 3 times a day with meals and sliding scale insulin. She recommended starting the same regimen inpatient. She will followup patient on Monday 1/13 (appointment for 10:30 AM) at which time she will consider switching her NovoLog to  regular insulin and Lantus to NPH which are more affordable. Patient was agreeable with this plan. Requested case manager to assist with Lantus which she has run out.   Dehydration: Resolved with aggressive IV fluid hydration   Hyponatremia: Sodium 112 at the time of admission secondary to dehydration and pseudohyponatremia. Improved to 132 at the time of discharge.  Hyperkalemia  Secondary to DKA. Resolved. Patient then developed hypokalemia which was repleted.   Depression  Continue home medications. Stable     Day of Discharge BP 106/71  Pulse 84  Temp 98.5 F (36.9 C) (Oral)  Resp 18  Ht 5\' 4"  (1.626 m)  Wt 59.421 kg (131 lb)  BMI 22.49 kg/m2  SpO2 100%  LMP  09/12/2012  Physical Exam: General: Alert and awake oriented x3 not in any acute distress. HEENT: anicteric sclera, pupils reactive to light and accommodation CVS: S1-S2 clear no murmur rubs or gallops Chest: clear to auscultation bilaterally, no wheezing rales or rhonchi Abdomen: soft nontender, nondistended, normal bowel sounds, no organomegaly Extremities: no cyanosis, clubbing or edema noted bilaterally Neuro: Cranial nerves II-XII intact, no focal neurological deficits   The results of significant diagnostics from this hospitalization (including imaging, microbiology, ancillary and laboratory) are listed below for reference.    LAB RESULTS: Basic Metabolic Panel:  Lab 09/28/12 0272 09/27/12 0335  NA 133* 130*  K 3.1* 3.6  CL 101 97  CO2 23 22  GLUCOSE 250* 106*  BUN 11 12  CREATININE 0.51 0.61  CALCIUM 8.1* 8.6  MG -- --  PHOS -- --   Liver Function Tests:  Lab 09/26/12 1813  AST 15  ALT 28  ALKPHOS 101  BILITOT 0.7  PROT 7.3  ALBUMIN 3.8  CBC:  Lab 09/28/12 0408 09/27/12 0335 09/26/12 1813  WBC 6.9 8.4 --  NEUTROABS -- -- 6.9  HGB 11.5* 11.8* --  HCT 32.8* 32.7* --  MCV 85.0 -- --  PLT 318 368 --   Cardiac Enzymes: No results found for this basename: CKTOTAL:2,CKMB:2,CKMBINDEX:2,TROPONINI:2 in the last 168 hours BNP: No components found with this basename: POCBNP:2 CBG:  Lab 09/28/12 0751 09/28/12 0301  GLUCAP 252* 232*    Significant Diagnostic Studies:  Dg Chest 2 View  09/26/2012  *RADIOLOGY REPORT*  Clinical Data: Diabetic ketoacidosis, weakness and cough.  CHEST - 2 VIEW  Comparison: 07/27/2012.  Findings: Trachea is midline.  Heart size normal.  Lungs are clear. No pleural fluid.  IMPRESSION: No acute findings.   Original Report Authenticated By: Leanna Battles, M.D.      Disposition and Follow-up: Discharge Orders    Future Appointments: Provider: Department: Dept Phone: Center:   09/30/2012 10:30 AM Carlus Pavlov, MD [facility] [phone] None     Future Orders Please Complete By Expires   Diet Carb Modified      Increase activity slowly      Discharge instructions      Comments:   Continue Lantus twice a day, NovoLog 10 units with meals and sliding scale insulin should be used as a correction factor       DISPOSITION: Home DIET: Carb modified diet ACTIVITY: As tolerated   DISCHARGE FOLLOW-UP Follow-up Information    Follow up with Carlus Pavlov, MD. On 09/30/2012. (10:30 am.)    Contact information:   [address] [facility] [place] [address] [phone]          Time spent on Discharge: 40 minutes  Signed:   Tyffani Foglesong M.D. [facility] 09/28/2012, 12:31 PM Pager: [phone]

## 2012-09-28 NOTE — Progress Notes (Signed)
Discharge teaching completed with teach back. No prescriptions given. Pt. To pick up from pharmacy. Handouts given on Blood Sugar Monitoring Adult, Correction Insulin. Mother at bedside. Pt. Has follow up appt. 09/30/12. Pt. Left with Mother via ambulation. No resp. Distress noted.

## 2012-09-28 NOTE — Care Management Note (Addendum)
    Page 1 of 2   09/28/2012     6:08:45 PM   CARE MANAGEMENT NOTE 09/28/2012  Patient:  [name], [name]   Account Number:  [id number]  Date Initiated:  09/27/2012  Documentation initiated by:  DAVIS,RHONDA  Subjective/Objective Assessment:   patient with hx of diabetes, could not afford her insulin and had been spacing out her doses.  admitted with dka     Action/Plan:   lives at home   Anticipated DC Date:  09/28/2012   Anticipated DC Plan:  HOME/SELF CARE  In-house referral  NA      DC Planning Services  CM consult  Medication Assistance      Choice offered to / List presented to:  C-1 Patient           Status of service:  Completed, signed off Medicare Important Message given?  NA - LOS <3 / Initial given by admissions (If response is "NO", the following Medicare IM given date fields will be blank) Date Medicare IM given:   Date Additional Medicare IM given:    Discharge Disposition:  HOME/SELF CARE  Per UR Regulation:  Reviewed for med. necessity/level of care/duration of stay  If discussed at Long Length of Stay Meetings, dates discussed:    Comments:  09/28/12 Leiya Keesey RN,BSN NCM [phone] SPOKE TO PATIENT ABOUT MED ASST & INSURANCE POLICY.EXPLAINED THAT SINCE SHE HAS SCRIPT COVERAGE THROUGH HER INSURANCE,THE HOSPITAL IS UNABLE TO GO AROUND HER INSURANCE TO PROVIDE HER W/MEDS ASSISTANCE, SHE HAS ALREADY RECEIVED THE PATIENT ASSISTANCE PROGRAM FORM FROM [employer].ENCOURAGED HER TO COMPLETE THE FORM & SEND IN TO COMPANY AS SOON AS POSSIBLE.SHE WAS VERY APPRECIATIVE.MD UPDATED.  [id number]/QIONGE Earlene Plater, RN, BSN, CCM: CHART REVIEWED AND UPDATED.  Next chart review due on [id number]. patient has [employer] blue shield insurance state that she is still unable to buy her insulin.  Information for patient assistance from the [employer] given to patient, and information and pharmacy card through [facility] also to patient. CASE MANAGEMENT [phone]

## 2012-09-30 ENCOUNTER — Ambulatory Visit (INDEPENDENT_AMBULATORY_CARE_PROVIDER_SITE_OTHER): Payer: BC Managed Care – PPO | Admitting: Internal Medicine

## 2012-09-30 ENCOUNTER — Encounter: Payer: Self-pay | Admitting: Internal Medicine

## 2012-09-30 VITALS — BP 100/80 | HR 113 | Temp 98.8°F | Resp 16 | Ht 65.0 in | Wt 151.0 lb

## 2012-09-30 DIAGNOSIS — E109 Type 1 diabetes mellitus without complications: Secondary | ICD-10-CM

## 2012-09-30 NOTE — Progress Notes (Signed)
Subjective:     Patient ID: [name], female   DOB: 1981-01-07, 32 y.o.   MRN: [id number]  HPI [name] is a pleasant 32 y/o woman with DM1 (dx in 09/2004, uncontrolled, with multiple admissions for DKA in the past), recently admitted for DKA (discharged 2 days ago)  - due to med noncompliance 2/2 cost. She missed at least 2 appts with me in the past month.  On the day of admission, she woke up with a sugar of 113, but she had a HA and believes she had some kind of virus from her daughter's day care. She quickly developed high sugars and that pm she presented to the ED in DKA. I spoke with Dr. Waymon Amato on the phone next day and advised him to have her see me in the clinic today. He voiced the concern that patient was not taking the insulin and she should have due to cost.  The patient does not bring her sugar log today but she tells me that her fasting sugars are in the 110s; had 2 lows at night - Lantus at 9 pm and the sugars dropped at 2-3 - woken up: 34 and 60. She has hypoglycemia awareness at 90. She feels that her sugars are much better controlled on the regimen that we formulated at last visit. Despite this, her hemoglobin A1c checked in the hospital with the high: Lab Results  Component Value Date   HGBA1C 12.2* 09/26/2012   We had a long discussion about how she would be able to afford her medicines, and she tells me that she discussed with her mother and she would help her obtain her Lantus and NovoLog. She tells me that her mother has a nursing background and she told the patient that the regimen of Lantus and NovoLog is the best. Consequently she would not like to switch to NPH and regular insulin. She told me that she missed her previous appointments due to the fact that she did not have gas for her car. Her mom will also help her with money for gas so that she can make it to her appointments.  She is due for an eye exam in next 2 months.   Review of Systems Constitutional:  intentional weight loss, no fatigue exc. with this past hospitalization Eyes: no blurry vision, no xerophthalmia ENT: no sore throat Cardiovascular: no CP/SOB/palpitations/leg swelling Respiratory: no cough/SOB Gastrointestinal: no N/V/D/C since Thursday Musculoskeletal: no muscle/joint aches  I reviewed pt's medications, allergies, PMH, social hx, family hx and no changes required. - she has been admitted this past weekend for DKA (d/c'd 2 days ago) as mentioned in HPI.  Objective:   Physical Exam BP 100/80  Pulse 113  Temp 98.8 F (37.1 C) (Oral)  Resp 16  Ht 5\' 5"  (1.651 m)  Wt 151 lb (68.493 kg)  BMI 25.13 kg/m2  SpO2 97%  LMP 09/12/2012 Wt Readings from Last 3 Encounters:  09/30/12 151 lb (68.493 kg)  09/27/12 131 lb (59.421 kg)  08/01/12 145 lb (65.772 kg)   Constitutional: slightly overweight, abdominal obesity, in NAD Eyes: PERRLA, EOMI, no exophthalmos ENT: moist mucous membranes, no thyromegaly, no cervical lymphadenopathy Cardiovascular: RRR, No MRG Respiratory: CTA B Gastrointestinal: abdomen soft, NT, ND, BS+ Musculoskeletal: no deformities, strength intact in all 4 Skin: moist, warm, no rashes Neurological: no tremor with outstretched hands, DTR normal in all 4  Assessment:     1. DM1 - still very uncontrolled, with a hemoglobin A1c of 12 -  recent DKA admission - noncompliance due to med cost  2. Recent hospitalization for DKA - pt not take Novolog as she could not afford it    Plan:     1. I believe that the patient is on a good basal-bolus regimen, however it is mandatory that she affords it. I tried to convince her to switch to NPH and regular insulin which are far more affordable, but she is adamant on Lantus and NovoLog. She talked with her insurance and apparently she should get a much lesser co-pay for her insulins compared to before. She will go directly to the pharmacy from here and see what the co-pay would be. Her parents also will help her  get her insulins and come to her appointments. - I gave the patient a bottle of Lantus, one of NovoLog, and 2 NovoLog pens as samples - I advised her to call us if she is about to run out of her medication so we can give her more samples - I will not change her regimen for now as I do not have any sugars to go by - I gave her a sugar log and I advised her how to fill it - I will schedule an appointment with her in 2 weeks and she will come with her log. We'll make changes in her in her insulin regimen then

## 2012-09-30 NOTE — Patient Instructions (Signed)
PLease let me know if you are about to run out of your insulins. Please return in 2 weeks with your log. Call me with any questions.  Tel. [phone] Fax [phone]

## 2012-10-14 ENCOUNTER — Ambulatory Visit: Payer: BC Managed Care – PPO | Admitting: Internal Medicine

## 2012-10-17 ENCOUNTER — Ambulatory Visit: Payer: BC Managed Care – PPO | Admitting: Internal Medicine

## 2012-10-24 ENCOUNTER — Ambulatory Visit (INDEPENDENT_AMBULATORY_CARE_PROVIDER_SITE_OTHER): Payer: BC Managed Care – PPO | Admitting: Internal Medicine

## 2012-10-24 ENCOUNTER — Encounter: Payer: Self-pay | Admitting: Internal Medicine

## 2012-10-24 VITALS — BP 108/80 | HR 106 | Temp 99.0°F | Resp 16 | Ht 65.0 in | Wt 144.0 lb

## 2012-10-24 DIAGNOSIS — E109 Type 1 diabetes mellitus without complications: Secondary | ICD-10-CM

## 2012-10-24 NOTE — Progress Notes (Signed)
Subjective:     Patient ID: [name], female   DOB: 1981-06-08, 32 y.o.   MRN: [id number]  HPI [name] is a pleasant 32 y/o woman with DM1 (dx in 09/2004, uncontrolled, with multiple admissions for DKA in the past), recently admitted for DKA (3 weeks ago)  - due to med noncompliance 2/2 cost. Last visit 3 weeks ago. Pt was late 34 min for her 30 min appt.  She is on a regimen of: - Lantus 20 units bid - NovoLog 8 units tid - NovoLog SSI: has a SSI, but not using it  She is under a lot of stress lately and sugars are high, but overall they look better she thinks. She brings her sugar log today and she documented her blood sugars for 2-3 weeks since I last saw her. Her sugars are between 80 and 140 in general, however she had a few values that are in the 200s. Today, she had the highest sugars since she woke up in the morning, in the 300s. Since she started to write down her CBGs, she noticed that whenever she eats high glycemic index foods, her sugars will spike up to 200, for example when she has cereals in the morning.  She has hypoglycemia awareness at 90. Her hemoglobin A1c checked in the hospital last month was high: Lab Results  Component Value Date   HGBA1C 12.2* 09/26/2012   At last visit, we had a long discussion about how she would be able to afford her medicines, and she tells me that her parents will help her with money for medications and appointments  She is due for an eye exam she was schedule it. Review of Systems Constitutional: no weight gain/loss, no fatigue, no subjective hyperthermia/hypothermia Eyes: no blurry vision, no xerophthalmia ENT: no sore throat, no nodules palpated in throat, no dysphagia/odynophagia, no hoarseness Cardiovascular: no CP/SOB/palpitations/leg swelling Respiratory: no cough/SOB Gastrointestinal: no N/V/D/C Musculoskeletal: no muscle/joint aches Skin: no rashes Neurological: no tremors/numbness/tingling/dizziness Psychiatric: no  depression/anxiety     Objective:   Physical Exam LMP 09/12/2012 Wt Readings from Last 3 Encounters:  10/24/12 144 lb (65.318 kg)  09/30/12 151 lb (68.493 kg)  09/27/12 131 lb (59.421 kg)   Constitutional: overweight, in NAD Eyes: PERRLA, EOMI, no exophthalmos ENT: moist mucous membranes, no thyromegaly, no cervical lymphadenopathy Cardiovascular: RRR, No MRG Respiratory: CTA B Gastrointestinal: abdomen soft, NT, ND, BS+ Musculoskeletal: no deformities, strength intact in all 4 Skin: moist, warm, no rashes Neurological: no tremor with outstretched hands, DTR normal in all 4  Assessment:     1. DM1 (dx in 09/2004, uncontrolled, with multiple admissions for DKA in the past)  Plan:     1. Pt's sugars appear improved since last visit, however she continues to have occasional highs in the 200 to 270s. His sugars were today in the 300s, previously not as high. She cannot pinpoint to what she did/eat that could have caused the hyperglycemia. We discussed about the fact that she might need a little be more insulin when she prepares to eat more carbs. - However the fact that she can drop her sugars in the 80's during the day, before meals, and since she has an imbalance between her basal daily insulin dose (40 units) and her bolus daily insulin (24 units), I will decrease her Lantus dose and change her regimen as follows: Please increase the Novolog to 10 units if you eat a larger meal or if you eat more carbs than usual. Please  decrease the morning Lantus in am to 15 units in am while keeping the Lantus at night at 20 units.  Please use the following Sliding Scale: - 151-180: +1 unit - 181-210: +2 units - 211-240: +3 units - 241-270: +4 units - 271-300: +5 units - 301-330: +6 units  - I advised her to continue to document her sugars as she already is, I will see her back in another 2 weeks to review her log. - At next visit we'll discuss about carb counting and possible referral to  diabetes education for this, so we can optimize her mealtime insulin - Advised her to join my chart and let me know her sugars even between appointments - No labs for today

## 2012-10-24 NOTE — Patient Instructions (Addendum)
Return to clinic 2 weeks. Please increase the Novolog to 10 units if you eat a larger meal or if you eat more carbs than usual. Please decrease the morning Lantus in am to 15 units in am while keeping the Lantus at night at 20 units.  Please use the following Sliding Scale: - 151-180: +1 unit - 181-210: +2 units - 211-240: +3 units - 241-270: +4 units - 271-300: +5 units - 301-330: +6 units Call me for higher sugars.

## 2012-11-01 ENCOUNTER — Emergency Department (HOSPITAL_COMMUNITY)
Admission: EM | Admit: 2012-11-01 | Discharge: 2012-11-01 | Disposition: A | Discharge status: Home / Self Care | Payer: BC Managed Care – PPO | Attending: Emergency Medicine | Admitting: Emergency Medicine

## 2012-11-01 ENCOUNTER — Encounter (HOSPITAL_COMMUNITY): Payer: Self-pay | Admitting: Emergency Medicine

## 2012-11-01 DIAGNOSIS — E1169 Type 2 diabetes mellitus with other specified complication: Secondary | ICD-10-CM | POA: Insufficient documentation

## 2012-11-01 DIAGNOSIS — R739 Hyperglycemia, unspecified: Secondary | ICD-10-CM

## 2012-11-01 DIAGNOSIS — Z794 Long term (current) use of insulin: Secondary | ICD-10-CM | POA: Insufficient documentation

## 2012-11-01 DIAGNOSIS — F3289 Other specified depressive episodes: Secondary | ICD-10-CM | POA: Insufficient documentation

## 2012-11-01 DIAGNOSIS — Z8619 Personal history of other infectious and parasitic diseases: Secondary | ICD-10-CM | POA: Insufficient documentation

## 2012-11-01 DIAGNOSIS — Z87440 Personal history of urinary (tract) infections: Secondary | ICD-10-CM | POA: Insufficient documentation

## 2012-11-01 DIAGNOSIS — F329 Major depressive disorder, single episode, unspecified: Secondary | ICD-10-CM | POA: Insufficient documentation

## 2012-11-01 DIAGNOSIS — Z79899 Other long term (current) drug therapy: Secondary | ICD-10-CM | POA: Insufficient documentation

## 2012-11-01 DIAGNOSIS — Z8679 Personal history of other diseases of the circulatory system: Secondary | ICD-10-CM | POA: Insufficient documentation

## 2012-11-01 DIAGNOSIS — F17200 Nicotine dependence, unspecified, uncomplicated: Secondary | ICD-10-CM | POA: Insufficient documentation

## 2012-11-01 LAB — BASIC METABOLIC PANEL
BUN: 12 mg/dL (ref 6–23)
Chloride: 90 mEq/L — ABNORMAL LOW (ref 96–112)
GFR calc Af Amer: >90 mL/min (ref >90)
GFR calc non Af Amer: >90 mL/min (ref >90)
Glucose, Bld: 540 mg/dL — ABNORMAL HIGH (ref 70–99)
Potassium: 3.4 mEq/L — ABNORMAL LOW (ref 3.5–5.1)
Sodium: 130 mEq/L — ABNORMAL LOW (ref 135–145)

## 2012-11-01 LAB — CBC WITH DIFFERENTIAL/PLATELET
Eosinophils Absolute: 0.2 10*3/uL (ref 0.0–0.7)
HCT: 37.7 % (ref 36.0–46.0)
Lymphocytes Relative: 28 % (ref 12–46)
MCHC: 34.5 g/dL (ref 30.0–36.0)
MCV: 85.7 fL (ref 78.0–100.0)
RDW: 11.7 % (ref 11.5–15.5)

## 2012-11-01 LAB — URINALYSIS, ROUTINE W REFLEX MICROSCOPIC
Nitrite: NEGATIVE
Specific Gravity, Urine: 1.039 — ABNORMAL HIGH (ref 1.005–1.030)
Urobilinogen, UA: 0.2 mg/dL (ref 0.0–1.0)
pH: 5 (ref 5.0–8.0)

## 2012-11-01 LAB — URINE MICROSCOPIC-ADD ON

## 2012-11-01 LAB — POCT PREGNANCY, URINE: Preg Test, Ur: NEGATIVE

## 2012-11-01 MED ORDER — INSULIN ASPART 100 UNIT/ML ~~LOC~~ SOLN
10.0000 [IU] | Freq: Once | SUBCUTANEOUS | Status: AC
  Administered 2012-11-01: 10 [IU] via INTRAVENOUS
  Filled 2012-11-01: qty 1

## 2012-11-01 MED ORDER — SODIUM CHLORIDE 0.9 % IV BOLUS (SEPSIS)
1000.0000 mL | Freq: Once | INTRAVENOUS | Status: AC
  Administered 2012-11-01: 1000 mL via INTRAVENOUS

## 2012-11-01 NOTE — ED Provider Notes (Signed)
History     CSN: [id number]  Arrival date & time 11/01/12  1909   First MD Initiated Contact with Patient 11/01/12 1954      Chief Complaint  Patient presents with  . Hyperglycemia    (Consider location/radiation/quality/duration/timing/severity/associated sxs/prior treatment) The history is provided by the patient.   32 year old female checked her sugar this afternoon and it was 220. She administered 20 units of NovoLog and took a nap and when she woke up, her sugar would not read on her meter which will read up to 600. She denies abdominal pain, nausea, vomiting, diarrhea. She denies fever or chills. She denies being unusually thirsty. She has history of brittle diabetes and that it is not unusual for her blood sugar jump up like this.  Past Medical History  Diagnosis Date  . Diabetes mellitus   . Chicken pox   . Depression   . Allergy     hayfever  . Migraines   . UTI (urinary tract infection)     Past Surgical History  Procedure Laterality Date  . Tonsillectomy    . Cesarean section    . Chiari malformation repair      Family History  Problem Relation Age of Onset  . Heart failure Other   . Drug abuse Other   . Asthma Mother   . Asthma Father   . Drug abuse Maternal Grandmother   . Drug abuse Maternal Grandfather   . Drug abuse Paternal Grandmother   . Drug abuse Paternal Grandfather     History  Substance Use Topics  . Smoking status: Never Smoker   . Smokeless tobacco: Never Used  . Alcohol Use: No    OB History   Grav Para Term Preterm Abortions TAB SAB Ect Mult Living                  Review of Systems  All other systems reviewed and are negative.    Allergies  Codeine  Home Medications   Current Outpatient Rx  Name  Route  Sig  Dispense  Refill  . DULoxetine (CYMBALTA) 20 MG capsule   Oral   Take 20 mg by mouth daily.          Marland Kitchen glucagon (GLUCAGON EMERGENCY) 1 MG injection   Intramuscular   Inject 1 mg into the muscle once as  needed.   1 each   2   . insulin aspart (NOVOLOG) 100 UNIT/ML injection   Subcutaneous   Inject 10 Units into the skin 3 (three) times daily before meals.   1 vial   3   . insulin aspart (NOVOLOG) 100 UNIT/ML injection      Sliding scale insulin:  CBG < 70: Drink juice; CBG 70 - 120: 0 units: CBG 121 - 150: 2 units; CBG 151 - 200: 3 units; CBG 201 - 250: 5 units; CBG 251 - 300: 8 units;CBG 301 - 350: 11 units; CBG 351 - 400: 15 units; CBG > 400 : 15 units and notify your MD   1 vial   3   . insulin glargine (LANTUS) 100 UNIT/ML injection      Take Lantus 20 units in the morning and 10 units at bedtime   10 mL   3   . Multiple Vitamin (MULTIVITAMIN WITH MINERALS) TABS   Oral   Take 1 tablet by mouth daily.         . promethazine (PHENERGAN) 12.5 MG tablet   Oral   Take 1  tablet (12.5 mg total) by mouth every 6 (six) hours as needed for nausea.   30 tablet   0     BP 139/95  Pulse 108  Temp(Src) 98.2 F (36.8 C) (Oral)  Resp 18  SpO2 100%  LMP 10/01/2012  Physical Exam  Nursing note and vitals reviewed.  32 year old female, resting comfortably and in no acute distress. Vital signs are significant for borderline hypertension with blood pressure 139/95, and tachycardia with heart rate 108. Oxygen saturation is 100%, which is normal. Head is normocephalic and atraumatic. PERRLA, EOMI. Oropharynx is clear. Neck is nontender and supple without adenopathy or JVD. Back is nontender and there is no CVA tenderness. Lungs are clear without rales, wheezes, or rhonchi. Chest is nontender. Heart has regular rate and rhythm without murmur. Abdomen is soft, flat, nontender without masses or hepatosplenomegaly and peristalsis is normoactive. Extremities have no cyanosis or edema, full range of motion is present. Skin is warm and dry without rash. Neurologic: Mental status is normal, cranial nerves are intact, there are no motor or sensory deficits.  ED Course  Procedures  (including critical care time)  Results for orders placed during the hospital encounter of 11/01/12  GLUCOSE, CAPILLARY      Result Value Range   Glucose-Capillary 591 (*) 70 - 99 mg/dL   Comment 1 Notify RN    CBC WITH DIFFERENTIAL      Result Value Range   WBC 7.0  4.0 - 10.5 K/uL   RBC 4.40  3.87 - 5.11 MIL/uL   Hemoglobin 13.0  12.0 - 15.0 g/dL   HCT 16.1  09.6 - 04.5 %   MCV 85.7  78.0 - 100.0 fL   MCH 29.5  26.0 - 34.0 pg   MCHC 34.5  30.0 - 36.0 g/dL   RDW 40.9  81.1 - 91.4 %   Platelets 443 (*) 150 - 400 K/uL   Neutrophils Relative 61  43 - 77 %   Neutro Abs 4.2  1.7 - 7.7 K/uL   Lymphocytes Relative 28  12 - 46 %   Lymphs Abs 1.9  0.7 - 4.0 K/uL   Monocytes Relative 8  3 - 12 %   Monocytes Absolute 0.6  0.1 - 1.0 K/uL   Eosinophils Relative 3  0 - 5 %   Eosinophils Absolute 0.2  0.0 - 0.7 K/uL   Basophils Relative 1  0 - 1 %   Basophils Absolute 0.1  0.0 - 0.1 K/uL  BASIC METABOLIC PANEL      Result Value Range   Sodium 130 (*) 135 - 145 mEq/L   Potassium 3.4 (*) 3.5 - 5.1 mEq/L   Chloride 90 (*) 96 - 112 mEq/L   CO2 24  19 - 32 mEq/L   Glucose, Bld 540 (*) 70 - 99 mg/dL   BUN 12  6 - 23 mg/dL   Creatinine, Ser 7.82  0.50 - 1.10 mg/dL   Calcium 9.6  8.4 - 95.6 mg/dL   GFR calc non Af Amer >90  >90 mL/min   GFR calc Af Amer >90  >90 mL/min  URINALYSIS, ROUTINE W REFLEX MICROSCOPIC      Result Value Range   Color, Urine YELLOW  YELLOW   APPearance CLEAR  CLEAR   Specific Gravity, Urine 1.039 (*) 1.005 - 1.030   pH 5.0  5.0 - 8.0   Glucose, UA >1000 (*) NEGATIVE mg/dL   Hgb urine dipstick TRACE (*) NEGATIVE   Bilirubin Urine  NEGATIVE  NEGATIVE   Ketones, ur NEGATIVE  NEGATIVE mg/dL   Protein, ur NEGATIVE  NEGATIVE mg/dL   Urobilinogen, UA 0.2  0.0 - 1.0 mg/dL   Nitrite NEGATIVE  NEGATIVE   Leukocytes, UA NEGATIVE  NEGATIVE  URINE MICROSCOPIC-ADD ON      Result Value Range   Squamous Epithelial / LPF FEW (*) RARE   WBC, UA 0-2  <3 WBC/hpf   RBC / HPF 0-2   <3 RBC/hpf   Bacteria, UA RARE  RARE   Urine-Other FEW YEAST    GLUCOSE, CAPILLARY      Result Value Range   Glucose-Capillary 244 (*) 70 - 99 mg/dL  POCT PREGNANCY, URINE      Result Value Range   Preg Test, Ur NEGATIVE  NEGATIVE    1. Hyperglycemia       MDM  Hyperglycemia. She does not show any signs and physical examination of ketoacidosis. She will be given IV fluids and IV insulin. Old records are reviewed and she has several ED visits for hyperglycemia and has been admitted for ketoacidosis.  Blood sugars come down to under 250 and there is no evidence of ketoacidosis. CO2 level is 24 and anion gap is 16 and there are no ketones in her urine. She will be discharged.      Dione Booze, MD 11/01/12 2213

## 2012-11-01 NOTE — ED Notes (Signed)
Pt c/o high blood sugar, pt states she was 220 at 1400, took 20 Units Novolog, took a nap. BS now 591. Pt denies n/v/d. C/o SHOB at this time. A & O. PWD

## 2012-11-01 NOTE — ED Notes (Signed)
Patient reports she has been under mental stress recently because of marital problems.  She reports that stress has caused her blood sugars to go high before.  Denies nausea, vomiting.  She does report having some mild SOB, and has had DKA before in which SOB was a symptom.  Patient denies any pain or any other symptoms.

## 2012-11-15 ENCOUNTER — Ambulatory Visit: Payer: BC Managed Care – PPO | Admitting: Internal Medicine

## 2012-11-19 ENCOUNTER — Ambulatory Visit: Payer: BC Managed Care – PPO | Admitting: Internal Medicine

## 2012-11-28 ENCOUNTER — Ambulatory Visit: Payer: BC Managed Care – PPO | Admitting: Internal Medicine

## 2012-12-03 IMAGING — CR DG CHEST 2V
2 series · 2 of 2 positions shown · non-contrast
Comparison: 12/14/2010

CLINICAL DATA: Chest pain and acute shortness of breath

CHEST - 2 VIEW

[w chest pa]
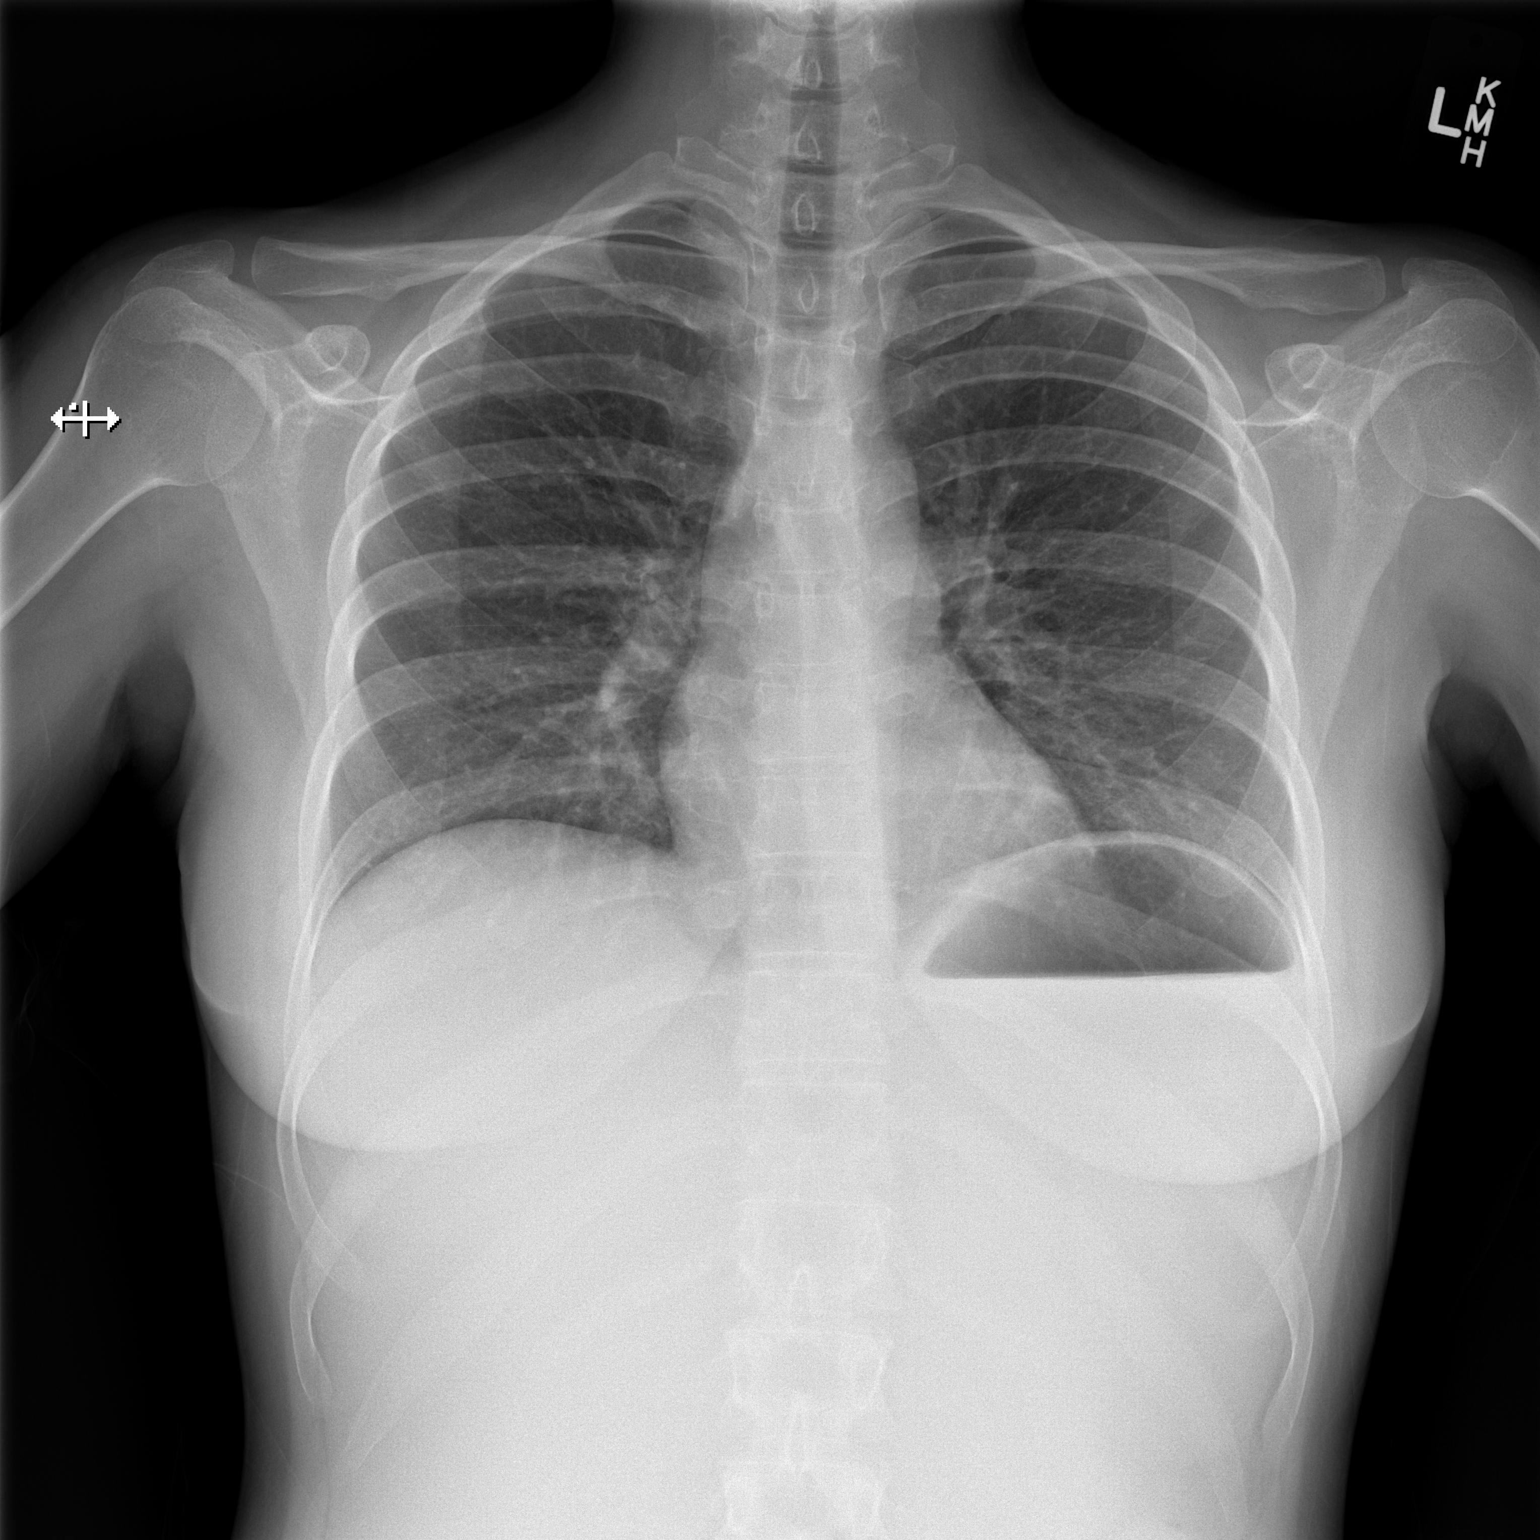

[w chest lat]
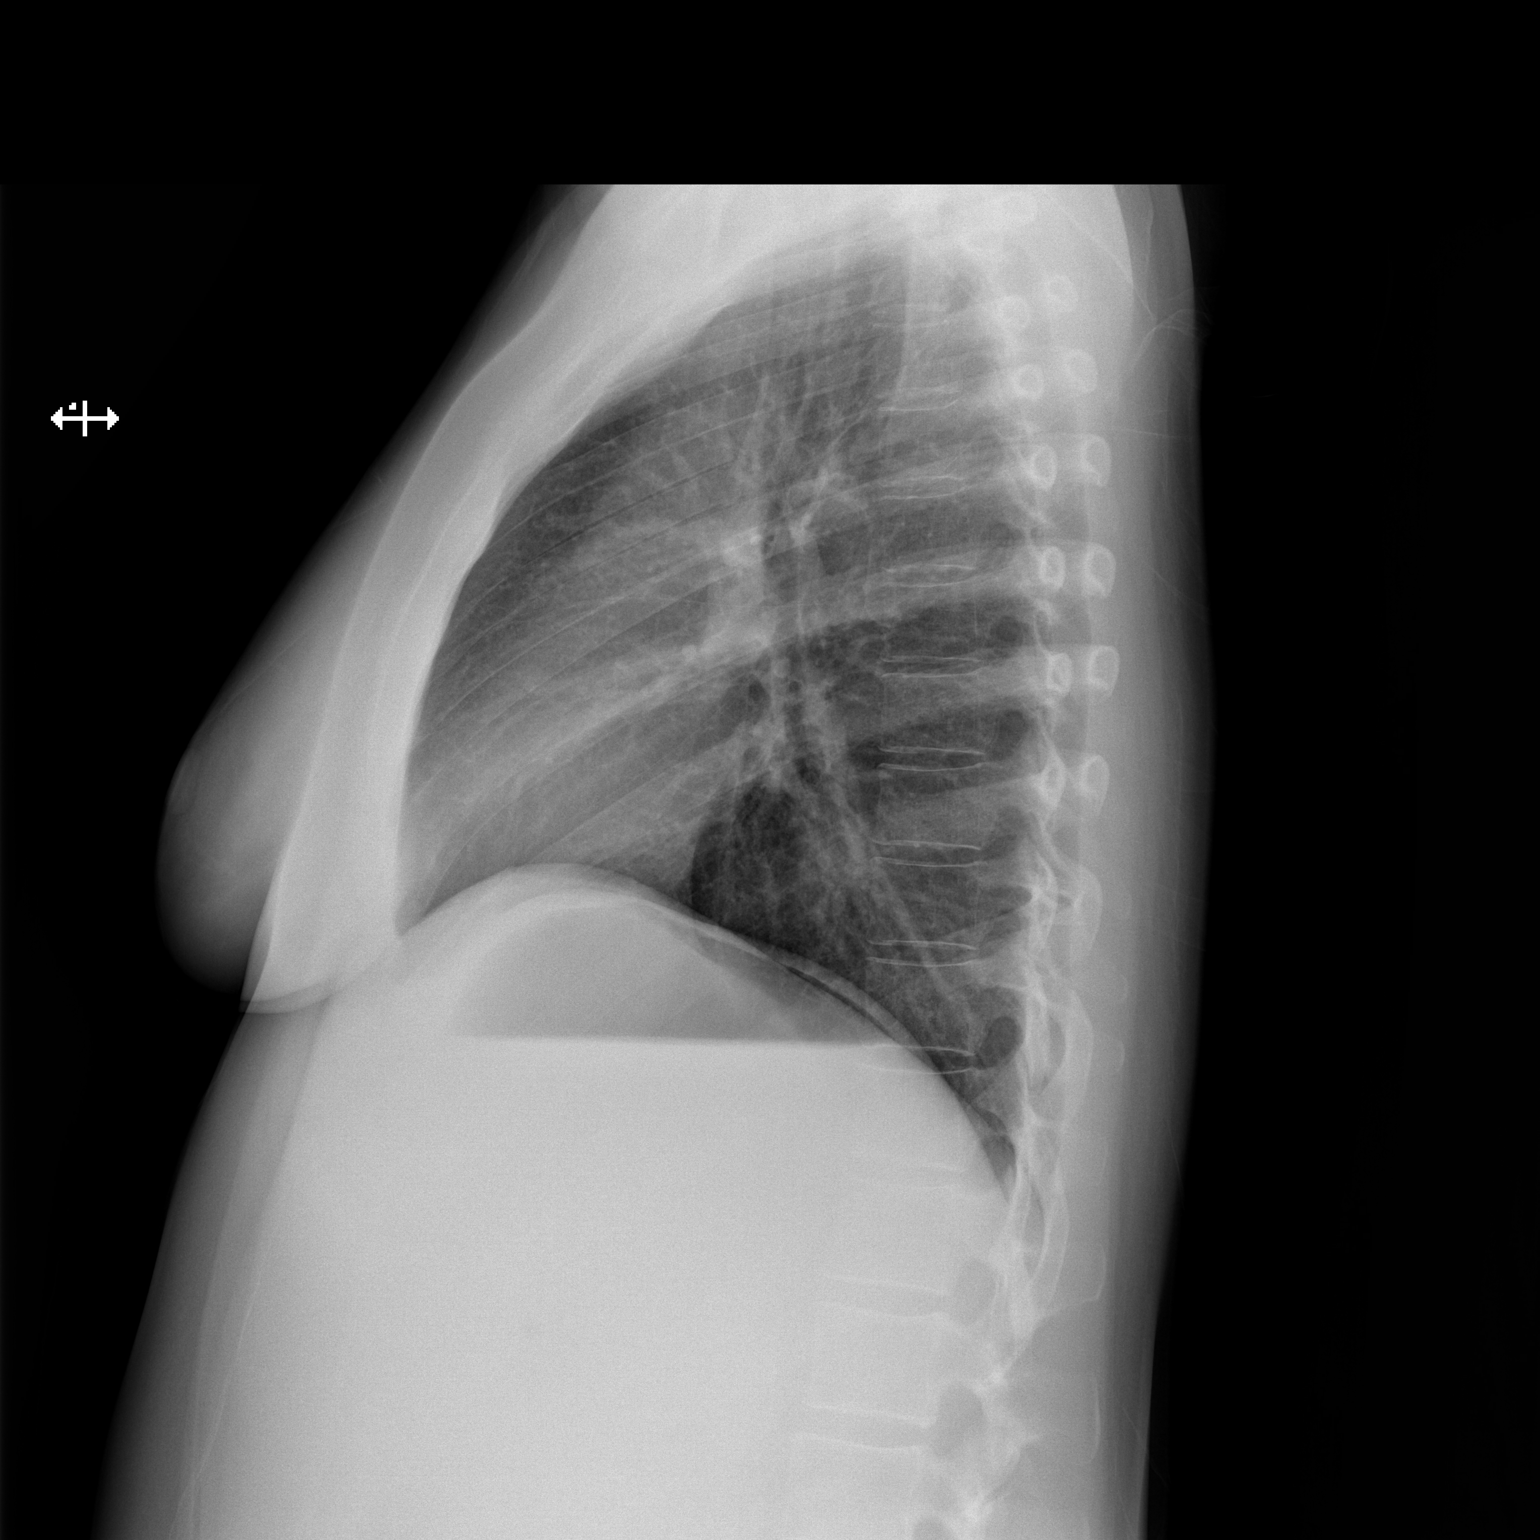

[2 of 2 positions shown; findings below may reference images not displayed]

FINDINGS: Slightly shallow inspiration. The heart size and
pulmonary vascularity are normal. The lungs appear clear and
expanded without focal air space disease or consolidation. No
blunting of the costophrenic angles.  No pneumothorax.  No
significant change since prior study.
IMPRESSION: No evidence of active pulmonary disease.

## 2012-12-12 ENCOUNTER — Ambulatory Visit: Payer: BC Managed Care – PPO | Admitting: Internal Medicine

## 2012-12-12 ENCOUNTER — Telehealth: Payer: Self-pay | Admitting: Internal Medicine

## 2012-12-12 NOTE — Telephone Encounter (Signed)
Pt canc today's appt d/t insurance. She says she will be able to r/s her appt in about 90 days. In the meantime, she plans on activating her MyChart account and sending in her daily blood sugars via MyChart / Roanna Raider

## 2012-12-12 NOTE — Telephone Encounter (Signed)
Please read note below and be advised.  

## 2013-01-14 ENCOUNTER — Other Ambulatory Visit: Payer: Self-pay | Admitting: Obstetrics and Gynecology

## 2013-01-14 DIAGNOSIS — N6310 Unspecified lump in the right breast, unspecified quadrant: Secondary | ICD-10-CM

## 2013-01-23 ENCOUNTER — Ambulatory Visit
Admission: RE | Admit: 2013-01-23 | Discharge: 2013-01-23 | Disposition: A | Discharge status: Home / Self Care | Payer: Medicaid Other | Source: Ambulatory Visit | Attending: Obstetrics and Gynecology | Admitting: Obstetrics and Gynecology

## 2013-01-23 DIAGNOSIS — N6310 Unspecified lump in the right breast, unspecified quadrant: Secondary | ICD-10-CM

## 2013-01-24 ENCOUNTER — Other Ambulatory Visit: Payer: BC Managed Care – PPO

## 2013-02-11 ENCOUNTER — Other Ambulatory Visit: Payer: Self-pay | Admitting: *Deleted

## 2013-02-11 ENCOUNTER — Telehealth: Payer: Self-pay | Admitting: *Deleted

## 2013-02-11 MED ORDER — ACCU-CHEK FASTCLIX LANCETS MISC: 102.0000 | Freq: Every day | Status: DC

## 2013-02-11 MED ORDER — GLUCOSE BLOOD VI STRP: ORAL_STRIP | Status: DC

## 2013-02-11 MED ORDER — ACCU-CHEK AVIVA PLUS W/DEVICE KIT: 1.0000 | PACK | Freq: Every day | Status: DC

## 2013-02-11 NOTE — Telephone Encounter (Signed)
[facility] [phone]) called requesting an rx to be sent in for pt to receive a new meter, Accu-chek Aviva Meter with strips and lancets. Pt is on medicaid and this one is what they require. Rx's must have detailed directions and diagnosis code. Please advise.

## 2013-02-11 NOTE — Telephone Encounter (Signed)
Carollee Herter, can you please enter and I will sign? Thank you.

## 2013-02-17 ENCOUNTER — Other Ambulatory Visit: Payer: Self-pay | Admitting: *Deleted

## 2013-02-17 ENCOUNTER — Other Ambulatory Visit: Payer: Self-pay | Admitting: Family Medicine

## 2013-02-17 MED ORDER — INSULIN GLARGINE 100 UNIT/ML ~~LOC~~ SOLN: 15.0000 [IU] | Freq: Two times a day (BID) | SUBCUTANEOUS | Status: DC

## 2013-04-21 ENCOUNTER — Ambulatory Visit: Payer: Self-pay | Admitting: Internal Medicine

## 2013-04-24 ENCOUNTER — Encounter: Payer: Self-pay | Admitting: Internal Medicine

## 2013-04-28 ENCOUNTER — Telehealth: Payer: Self-pay | Admitting: *Deleted

## 2013-04-28 NOTE — Telephone Encounter (Signed)
The staff in the front office asked since we are required to take care of pt for 30 days after a pt has been dismissed, do you still want to leave her on your schedule for Aug.15th or should they call and cancel? Please advise.

## 2013-04-28 NOTE — Telephone Encounter (Signed)
I will be available for calls, but we will need to cancel that appt.

## 2013-04-28 NOTE — Telephone Encounter (Signed)
Advised front office staff that Dr Elvera Lennox stated she will be available for phone calls, but we need to cancel the Aug. 15th appt. Understood.

## 2013-04-29 ENCOUNTER — Encounter: Payer: Self-pay | Admitting: Internal Medicine

## 2013-05-02 ENCOUNTER — Ambulatory Visit: Payer: Self-pay | Admitting: Internal Medicine

## 2013-05-15 ENCOUNTER — Ambulatory Visit: Payer: Medicaid Other

## 2013-05-16 ENCOUNTER — Other Ambulatory Visit: Payer: Self-pay | Admitting: Nurse Practitioner

## 2013-05-20 MED ORDER — INSULIN ASPART 100 UNIT/ML ~~LOC~~ SOLN: 8.0000 [IU] | Freq: Three times a day (TID) | SUBCUTANEOUS | Status: DC

## 2013-05-20 MED ORDER — INSULIN GLARGINE 100 UNIT/ML ~~LOC~~ SOLN: 15.0000 [IU] | Freq: Two times a day (BID) | SUBCUTANEOUS | Status: DC

## 2013-05-20 NOTE — Telephone Encounter (Signed)
Prescription renewed in EPIC. Needs to be seen.

## 2013-05-20 NOTE — Telephone Encounter (Signed)
Patient calling that she is out of insulin  Not seen since EPIC and last glucose 11/01/12

## 2013-05-22 NOTE — Telephone Encounter (Signed)
Left message on her voice mail that lantus called in [facility] pharmacy and will need to be seen

## 2013-05-23 ENCOUNTER — Telehealth: Payer: Self-pay

## 2013-05-23 ENCOUNTER — Ambulatory Visit: Payer: Medicaid Other | Admitting: General Practice

## 2013-05-23 NOTE — Telephone Encounter (Signed)
Dismissal Letter sent by Certified Mail on 04/28/2013  Dismissal Letter returned Unclaimed on 05/21/2013  Dismissal Letter sent by 1st Class Mail to [id number], [place] on 05/23/2013

## 2013-05-26 ENCOUNTER — Ambulatory Visit: Payer: Medicaid Other

## 2013-05-28 ENCOUNTER — Ambulatory Visit: Payer: Medicaid Other | Admitting: Family Medicine

## 2013-06-09 ENCOUNTER — Ambulatory Visit: Payer: Self-pay

## 2013-07-14 ENCOUNTER — Encounter: Payer: Self-pay | Admitting: Pharmacist

## 2013-07-14 ENCOUNTER — Ambulatory Visit (INDEPENDENT_AMBULATORY_CARE_PROVIDER_SITE_OTHER): Payer: Medicaid Other | Admitting: Pharmacist

## 2013-07-14 VITALS — BP 130/80 | HR 88 | Ht 64.0 in | Wt 138.0 lb

## 2013-07-14 DIAGNOSIS — F3289 Other specified depressive episodes: Secondary | ICD-10-CM

## 2013-07-14 DIAGNOSIS — F32A Depression, unspecified: Secondary | ICD-10-CM

## 2013-07-14 DIAGNOSIS — F329 Major depressive disorder, single episode, unspecified: Secondary | ICD-10-CM

## 2013-07-14 DIAGNOSIS — Z23 Encounter for immunization: Secondary | ICD-10-CM

## 2013-07-14 DIAGNOSIS — E109 Type 1 diabetes mellitus without complications: Secondary | ICD-10-CM

## 2013-07-14 MED ORDER — GLUCOSE BLOOD VI STRP: ORAL_STRIP | Status: DC

## 2013-07-14 MED ORDER — DULOXETINE HCL 20 MG PO CPEP: 20.0000 mg | ORAL_CAPSULE | Freq: Every day | ORAL | Status: DC

## 2013-07-14 MED ORDER — ACCU-CHEK FASTCLIX LANCETS MISC: 102.0000 | Freq: Every day | Status: DC

## 2013-07-14 NOTE — Progress Notes (Signed)
Diabetes Follow-Up Visit Chief Complaint:   Chief Complaint  Patient presents with  . Diabetes     Filed Vitals:   07/14/13 0852  BP: 130/80  Pulse: 88   Filed Weights   07/14/13 0852  Weight: 138 lb (62.596 kg)     HPI: Patient has not been in our office for several months.  She has just recently been approved for medical coverage for Medicaid.  Cost of office visits in the past have kept her from being fully compliant with follow up in the past, though she feels that she can now afford to come in regularly for insulin adjustments.   She has uncontrolled type 1 DM.  Currently treated with Lantus 15 units BID and Novolog 10 units prior to meals, although she states that her BG was 141 this am and she gave 20 units of both Lantus and Novolog with breakfast because she was afraid that it would be elevated.  Her BG in office (which is 2 hours postprandial) is 160 today.  Patient is a single mother of 6yo girl, she is a Architectural technologist but currently unemployed.  She had a friend with type 1 DM die due to a drowning which may have resulted from hypoglycemia and she is very scared.  She reports that she often feels very nervous since it is just she and her daughter at home.  She wants to get better control of her DM. She previously took cymbalta 20mg  daily for depression and anxiety with good results but has not had a prescription in several months.   Low fat/carbohydrate diet?  Yes Nicotine Abuse?  No Medication Compliance?  Yes Exercise?  Yes Alcohol Abuse?  No  Home BG Monitoring:  Checking  6 to 8 times a day.  Patient reports that she has been paying full price for her one touch test strips because Medicaid did not cover them.  I explained that Medicaid does cover Aviva meter and testing supplies.   Average:  varies  High: 210  Low:  105 She reports that BG highest in the morning.  She reports only 1 hypoglycemic episode recently and she only felt it was low - was actually 90 when  she checked.  Denies nocturnal hypoglycemia.   Exam Edema:  negative  Polyuria:  negatvie  Polydipsia:  negative Polyphagia:  negative  BMI:  Body mass index is 23.68 kg/(m^2).   Weight changes:  Pt reports about a 6# weight loss in last few months General Appearance:  alert, oriented, no acute distress Mood/Affect:  anxious      Lab Results  Component Value Date   HGBA1C 12.2* 09/26/2012    Lab Results  Component Value Date   MICROALBUR 0.51 08/01/2012    No results found for this basename: CHOL, HDL, LDLCALC, LDLDIRECT, TRIG, CHOLHDL      Assessment: 1.  Diabetes.  Poorly controlled  2.  Blood Pressure.  At goal 3.  Lipids.  Due to have rechecked but not fasting today 4.  Anxiety / Depression  Recommendations: 1.  Medication recommendations at this time are as follows:    Restart cymbalta 20mg  1 capsule daily  Increase lantus to 20 units BID  Continue Novolog 10 units with meals but use sliding scale below when BG elevated prior to meals.  Blood Sugar/Glucose Reading  Amount of insulin to give prior to meal  Less than 80 None  80-120 10 units  121 - 175 12 units  175 - 200 14 units  201 - 250 16 units  251 - 300 18 units  301 or greater 20 units   2.  Reviewed HBG goals:  Fasting 80-130 and 1-2 hour post prandial <180.  Patient is instructed to check BG 8 times per day.   Aviva glucometer given to patient (she had not picked up rx for other glucometer and strips).  She was also given Rx for test strips and lancets.   3.  BP goal < 140/80. 4.  LDL goal of < 100, HDL > 40 and TG < 150. 5.  Eye Exam yearly and Dental Exam every 6 months. 6.  Dietary recommendations:  Continue to limit CHO in diet.  7.  Physical Activity recommendations:  Continue to exercise regularly 8.  Return to clinic in 2 wks   Time spent counseling patient:  60 minutes  Referring provider:  Paulene Floor  NP  Henrene Pastor, PharmD, CPP

## 2013-07-30 ENCOUNTER — Ambulatory Visit: Payer: Self-pay

## 2013-08-07 ENCOUNTER — Other Ambulatory Visit: Payer: Self-pay | Admitting: Family Medicine

## 2013-08-18 ENCOUNTER — Ambulatory Visit: Payer: Medicaid Other

## 2013-08-29 ENCOUNTER — Other Ambulatory Visit: Payer: Self-pay

## 2013-08-29 DIAGNOSIS — F329 Major depressive disorder, single episode, unspecified: Secondary | ICD-10-CM

## 2013-08-29 DIAGNOSIS — F32A Depression, unspecified: Secondary | ICD-10-CM

## 2013-08-29 NOTE — Telephone Encounter (Signed)
Last seen 07/14/13  [name]  Not in Epic as seeing anyone else

## 2013-09-01 MED ORDER — DULOXETINE HCL 20 MG PO CPEP: 20.0000 mg | ORAL_CAPSULE | Freq: Every day | ORAL | Status: DC

## 2013-09-01 NOTE — Telephone Encounter (Signed)
Must make appt with Bennie Pierini- will RF for 30 days only

## 2013-09-11 ENCOUNTER — Encounter (HOSPITAL_COMMUNITY): Payer: Self-pay | Admitting: Emergency Medicine

## 2013-09-11 ENCOUNTER — Emergency Department (HOSPITAL_COMMUNITY)
Admission: EM | Admit: 2013-09-11 | Discharge: 2013-09-11 | Disposition: A | Discharge status: Home / Self Care | Payer: Medicaid Other | Attending: Emergency Medicine | Admitting: Emergency Medicine

## 2013-09-11 DIAGNOSIS — Z79899 Other long term (current) drug therapy: Secondary | ICD-10-CM | POA: Insufficient documentation

## 2013-09-11 DIAGNOSIS — R059 Cough, unspecified: Secondary | ICD-10-CM | POA: Insufficient documentation

## 2013-09-11 DIAGNOSIS — F329 Major depressive disorder, single episode, unspecified: Secondary | ICD-10-CM | POA: Insufficient documentation

## 2013-09-11 DIAGNOSIS — Z794 Long term (current) use of insulin: Secondary | ICD-10-CM | POA: Insufficient documentation

## 2013-09-11 DIAGNOSIS — H9209 Otalgia, unspecified ear: Secondary | ICD-10-CM | POA: Insufficient documentation

## 2013-09-11 DIAGNOSIS — J029 Acute pharyngitis, unspecified: Secondary | ICD-10-CM

## 2013-09-11 DIAGNOSIS — F3289 Other specified depressive episodes: Secondary | ICD-10-CM | POA: Insufficient documentation

## 2013-09-11 DIAGNOSIS — R05 Cough: Secondary | ICD-10-CM | POA: Insufficient documentation

## 2013-09-11 DIAGNOSIS — E119 Type 2 diabetes mellitus without complications: Secondary | ICD-10-CM | POA: Insufficient documentation

## 2013-09-11 DIAGNOSIS — R509 Fever, unspecified: Secondary | ICD-10-CM | POA: Insufficient documentation

## 2013-09-11 MED ORDER — LIDOCAINE VISCOUS 2 % MT SOLN: 20.0000 mL | OROMUCOSAL | Status: DC | PRN

## 2013-09-11 NOTE — ED Notes (Signed)
Pt c/o of sore throat since yesterday with cough and also left ear is hurting. Pt reports fever last pm but none today. Pt in NAD. Denies N/V/D.

## 2013-09-11 NOTE — ED Provider Notes (Signed)
CSN: [id number]     Arrival date & time 09/11/13  1356 History  This chart was scribed for non-physician practitioner Francee Piccolo, PA-C working with Dagmar Hait, MD by Danella Maiers, ED Scribe. This patient was seen in room WTR7/WTR7 and the patient's care was started at 2:01 PM.   Chief Complaint  Patient presents with  . Sore Throat   The history is provided by the patient. No language interpreter was used.   HPI Comments: [name] is a 32 y.o. female who presents to the Emergency Department complaining of constant sore throat since yesterday with dry cough and right otalgia. She states the right side of her throat hurts worse than the left. Pt reports lowgrade fever of 100 last night but none today. She took Tylenol last night. No other modifying factors. She states she has a 36-year old at home who is currently being treated for strep throat.   Past Medical History  Diagnosis Date  . Diabetes mellitus   . Chicken pox   . Depression   . Allergy     hayfever  . Migraines   . UTI (urinary tract infection)    Past Surgical History  Procedure Laterality Date  . Tonsillectomy    . Cesarean section    . Chiari malformation repair     Family History  Problem Relation Age of Onset  . Heart failure Other   . Drug abuse Other   . Asthma Mother   . Fibroids Mother   . Asthma Father   . Drug abuse Maternal Grandmother   . Drug abuse Maternal Grandfather   . Drug abuse Paternal Grandmother   . Drug abuse Paternal Grandfather    History  Substance Use Topics  . Smoking status: Never Smoker   . Smokeless tobacco: Never Used  . Alcohol Use: No   OB History   Grav Para Term Preterm Abortions TAB SAB Ect Mult Living                 Review of Systems  Constitutional: Positive for fever (last night).  HENT: Positive for ear pain and sore throat.   Respiratory: Positive for cough.   All other systems reviewed and are negative.    Allergies   Codeine  Home Medications   Current Outpatient Rx  Name  Route  Sig  Dispense  Refill  . acetaminophen (TYLENOL) 325 MG tablet   Oral   Take 650 mg by mouth every 6 (six) hours as needed.         . DULoxetine (CYMBALTA) 20 MG capsule   Oral   Take 1 capsule (20 mg total) by mouth daily.   30 capsule   0   . LANTUS SOLOSTAR 100 UNIT/ML SOPN      INJECT UP TO 40 UNITS SQ AT BEDTIME   15 mL   0   . Multiple Vitamin (MULTIVITAMIN WITH MINERALS) TABS   Oral   Take 1 tablet by mouth daily.         Marland Kitchen NOVOLOG FLEXPEN 100 UNIT/ML SOPN FlexPen      INJECT 8 UNITS SQ 3 TIMES A DAY BEFORE MEALS   15 mL   3   . Omega-3 Fatty Acids (FISH OIL) 1000 MG CAPS   Oral   Take 1,000 mg by mouth daily.         Marland Kitchen lidocaine (XYLOCAINE) 2 % solution   Mouth/Throat   Use as directed 20 mLs in the mouth or  throat as needed for mouth pain.   100 mL   0    BP 128/88  Pulse 121  Temp(Src) 97.8 F (36.6 C) (Oral)  Resp 16  SpO2 98%  LMP 08/12/2013 Physical Exam  Constitutional: She is oriented to person, place, and time. She appears well-developed and well-nourished. No distress.  HENT:  Head: Normocephalic and atraumatic.  Right Ear: Tympanic membrane and external ear normal.  Left Ear: Tympanic membrane and external ear normal.  Nose: Nose normal. No rhinorrhea.  Mouth/Throat: Uvula is midline, oropharynx is clear and moist and mucous membranes are normal. No trismus in the jaw. No uvula swelling. No oropharyngeal exudate.  Eyes: Conjunctivae are normal.  Neck: Normal range of motion. Neck supple.  Cardiovascular: Normal rate, regular rhythm and normal heart sounds.   Pulmonary/Chest: Effort normal and breath sounds normal. No respiratory distress. She has no wheezes. She has no rales. She exhibits no tenderness.  Abdominal: Soft. There is no tenderness.  Musculoskeletal: Normal range of motion.  Neurological: She is alert and oriented to person, place, and time.  Skin:  Skin is warm and dry. She is not diaphoretic.  Psychiatric: She has a normal mood and affect.    ED Course  Procedures (including critical care time) Medications - No data to display  DIAGNOSTIC STUDIES: Oxygen Saturation is 98% on RA, normal by my interpretation.    COORDINATION OF CARE: 2:36 PM- Discussed treatment plan with pt. Pt agrees to plan.     Labs Review Labs Reviewed  RAPID STREP SCREEN  CULTURE, GROUP A STREP   Imaging Review No results found.  EKG Interpretation   None        MDM   1. Viral pharyngitis     Afebrile, NAD, non-toxic appearing, AAOx4. Pt afebrile without tonsillar exudate, negative strep. Presents with mild cervical lymphadenopathy, & dysphagia; diagnosis of viral pharyngitis. Although noted to be tachycardic in nursing note, heart rate was normal upon my auscultation. No abx indicated. DC w symptomatic tx for pain  Pt does not appear dehydrated, but did discuss importance of water rehydration. Presentation non concerning for PTA or infxn spread to soft tissue. No trismus or uvula deviation. Specific return precautions discussed. Pt able to drink water in ED without difficulty with intact air way. Recommended PCP follow up.    I personally performed the services described in this documentation, which was scribed in my presence. The recorded information has been reviewed and is accurate.      DESHAY KIRSTEIN, PA-C 09/11/13 1555

## 2013-09-12 NOTE — ED Provider Notes (Signed)
Medical screening examination/treatment/procedure(s) were performed by non-physician practitioner and as supervising physician I was immediately available for consultation/collaboration.  EKG Interpretation   None         Dagmar Hait, MD 09/12/13 [phone]

## 2013-09-13 LAB — CULTURE, GROUP A STREP

## 2013-09-29 ENCOUNTER — Telehealth: Payer: Self-pay | Admitting: Family Medicine

## 2013-10-08 ENCOUNTER — Telehealth: Payer: Self-pay | Admitting: Family Medicine

## 2013-10-08 NOTE — Telephone Encounter (Signed)
There is already a phone message for this patient - see other note for information.

## 2013-10-08 NOTE — Telephone Encounter (Signed)
Called patient - she had questions about account and making appt.  [facility]  Patient does now have medicaid and will be able to pay that co pay.  She also will be able to pay past debt when she receives her tax return.

## 2013-11-01 ENCOUNTER — Emergency Department (HOSPITAL_COMMUNITY): Payer: Medicaid Other

## 2013-11-01 ENCOUNTER — Emergency Department (HOSPITAL_COMMUNITY)
Admission: EM | Admit: 2013-11-01 | Discharge: 2013-11-02 | Disposition: A | Discharge status: Home / Self Care | Payer: Medicaid Other | Attending: Emergency Medicine | Admitting: Emergency Medicine

## 2013-11-01 ENCOUNTER — Encounter (HOSPITAL_COMMUNITY): Payer: Self-pay | Admitting: Emergency Medicine

## 2013-11-01 DIAGNOSIS — F329 Major depressive disorder, single episode, unspecified: Secondary | ICD-10-CM | POA: Insufficient documentation

## 2013-11-01 DIAGNOSIS — Z79899 Other long term (current) drug therapy: Secondary | ICD-10-CM | POA: Insufficient documentation

## 2013-11-01 DIAGNOSIS — Z3202 Encounter for pregnancy test, result negative: Secondary | ICD-10-CM | POA: Insufficient documentation

## 2013-11-01 DIAGNOSIS — E119 Type 2 diabetes mellitus without complications: Secondary | ICD-10-CM | POA: Insufficient documentation

## 2013-11-01 DIAGNOSIS — Z8679 Personal history of other diseases of the circulatory system: Secondary | ICD-10-CM | POA: Insufficient documentation

## 2013-11-01 DIAGNOSIS — Z8619 Personal history of other infectious and parasitic diseases: Secondary | ICD-10-CM | POA: Insufficient documentation

## 2013-11-01 DIAGNOSIS — R102 Pelvic and perineal pain: Secondary | ICD-10-CM

## 2013-11-01 DIAGNOSIS — F3289 Other specified depressive episodes: Secondary | ICD-10-CM | POA: Insufficient documentation

## 2013-11-01 DIAGNOSIS — D649 Anemia, unspecified: Secondary | ICD-10-CM | POA: Insufficient documentation

## 2013-11-01 DIAGNOSIS — N949 Unspecified condition associated with female genital organs and menstrual cycle: Secondary | ICD-10-CM | POA: Insufficient documentation

## 2013-11-01 DIAGNOSIS — R300 Dysuria: Secondary | ICD-10-CM | POA: Insufficient documentation

## 2013-11-01 DIAGNOSIS — R739 Hyperglycemia, unspecified: Secondary | ICD-10-CM

## 2013-11-01 DIAGNOSIS — Z87440 Personal history of urinary (tract) infections: Secondary | ICD-10-CM | POA: Insufficient documentation

## 2013-11-01 DIAGNOSIS — R000 Tachycardia, unspecified: Secondary | ICD-10-CM | POA: Insufficient documentation

## 2013-11-01 LAB — POCT PREGNANCY, URINE: PREG TEST UR: NEGATIVE

## 2013-11-01 LAB — BASIC METABOLIC PANEL
BUN: 14 mg/dL (ref 6–23)
CALCIUM: 9.4 mg/dL (ref 8.4–10.5)
CO2: 26 mEq/L (ref 19–32)
Chloride: 96 mEq/L (ref 96–112)
Creatinine, Ser: 0.49 mg/dL — ABNORMAL LOW (ref 0.50–1.10)
GLUCOSE: 319 mg/dL — AB (ref 70–99)
POTASSIUM: 4 meq/L (ref 3.7–5.3)
SODIUM: 135 meq/L — AB (ref 137–147)

## 2013-11-01 LAB — WET PREP, GENITAL: Trich, Wet Prep: NONE SEEN

## 2013-11-01 LAB — CBC WITH DIFFERENTIAL/PLATELET
BASOS PCT: 1 % (ref 0–1)
Basophils Absolute: 0.1 10*3/uL (ref 0.0–0.1)
EOS ABS: 0.3 10*3/uL (ref 0.0–0.7)
EOS PCT: 4 % (ref 0–5)
HCT: 32.1 % — ABNORMAL LOW (ref 36.0–46.0)
Hemoglobin: 10.5 g/dL — ABNORMAL LOW (ref 12.0–15.0)
Lymphocytes Relative: 34 % (ref 12–46)
Lymphs Abs: 2.2 10*3/uL (ref 0.7–4.0)
MCH: 25.5 pg — AB (ref 26.0–34.0)
MCHC: 32.7 g/dL (ref 30.0–36.0)
MCV: 77.9 fL — AB (ref 78.0–100.0)
MONOS PCT: 10 % (ref 3–12)
Monocytes Absolute: 0.6 10*3/uL (ref 0.1–1.0)
NEUTROS PCT: 51 % (ref 43–77)
Neutro Abs: 3.3 10*3/uL (ref 1.7–7.7)
PLATELETS: 426 10*3/uL — AB (ref 150–400)
RBC: 4.12 MIL/uL (ref 3.87–5.11)
RDW: 13.7 % (ref 11.5–15.5)
WBC: 6.4 10*3/uL (ref 4.0–10.5)

## 2013-11-01 LAB — HEPATIC FUNCTION PANEL
ALBUMIN: 3.8 g/dL (ref 3.5–5.2)
ALK PHOS: 92 U/L (ref 39–117)
ALT: 30 U/L (ref 0–35)
AST: 23 U/L (ref 0–37)
Total Protein: 7.8 g/dL (ref 6.0–8.3)

## 2013-11-01 LAB — URINALYSIS, ROUTINE W REFLEX MICROSCOPIC
BILIRUBIN URINE: NEGATIVE
Glucose, UA: >1000 mg/dL — AB
HGB URINE DIPSTICK: NEGATIVE
Ketones, ur: NEGATIVE mg/dL
NITRITE: NEGATIVE
PROTEIN: NEGATIVE mg/dL
SPECIFIC GRAVITY, URINE: 1.046 — AB (ref 1.005–1.030)
UROBILINOGEN UA: 0.2 mg/dL (ref 0.0–1.0)
pH: 7 (ref 5.0–8.0)

## 2013-11-01 LAB — URINE MICROSCOPIC-ADD ON

## 2013-11-01 LAB — GLUCOSE, CAPILLARY: Glucose-Capillary: 280 mg/dL — ABNORMAL HIGH (ref 70–99)

## 2013-11-01 MED ORDER — HYDROCODONE-ACETAMINOPHEN 5-325 MG PO TABS: ORAL_TABLET | ORAL | Status: DC

## 2013-11-01 MED ORDER — NAPROXEN 500 MG PO TABS: 500.0000 mg | ORAL_TABLET | Freq: Two times a day (BID) | ORAL | Status: DC

## 2013-11-01 MED ORDER — HYDROCODONE-ACETAMINOPHEN 5-325 MG PO TABS
1.0000 | ORAL_TABLET | Freq: Once | ORAL | Status: AC
  Administered 2013-11-01: 1 via ORAL
  Filled 2013-11-01: qty 1

## 2013-11-01 NOTE — Discharge Instructions (Signed)
Please read and follow all provided instructions.  Your diagnoses today include:  1. Pelvic pain   2. Hyperglycemia     Tests performed today include:  Blood counts and electrolytes - shows high blood sugar without complication  Blood tests to check liver and kidney function  Urine test to look for infection and pregnancy (in women) - shows no infection  Wet prep - shows yeast infection  Vital signs. See below for your results today.   Medications prescribed:   Vicodin (hydrocodone/acetaminophen) - narcotic pain medication  DO NOT drive or perform any activities that require you to be awake and alert because this medicine can make you drowsy. BE VERY CAREFUL not to take multiple medicines containing Tylenol (also called acetaminophen). Doing so can lead to an overdose which can damage your liver and cause liver failure and possibly death.   Naproxen - anti-inflammatory pain medication  Do not exceed 500mg  naproxen every 12 hours, take with food  You have been prescribed an anti-inflammatory medication or NSAID. Take with food. Take smallest effective dose for the shortest duration needed for your pain. Stop taking if you experience stomach pain or vomiting.    Diflucan - medication for yeast infection   Take any prescribed medications only as directed.  Home care instructions:   Follow any educational materials contained in this packet.  Follow-up instructions: Please follow-up with your gynecologist in the next week for further evaluation of your symptoms. If you do not have a primary care doctor -- see below for referral information.   Return instructions:  SEEK IMMEDIATE MEDICAL ATTENTION IF:  The pain does not go away or becomes severe   A temperature above 101F develops   Repeated vomiting occurs (multiple episodes)   The pain becomes localized to portions of the abdomen. The right side could possibly be appendicitis. In an adult, the left lower portion of  the abdomen could be colitis or diverticulitis.   Blood is being passed in stools or vomit (bright red or black tarry stools)   You develop chest pain, difficulty breathing, dizziness or fainting, or become confused, poorly responsive, or inconsolable (young children)  If you have any other emergent concerns regarding your health  Additional Information: Abdominal (belly) pain can be caused by many things. Your caregiver performed an examination and possibly ordered blood/urine tests and imaging (CT scan, x-rays, ultrasound). Many cases can be observed and treated at home after initial evaluation in the emergency department. Even though you are being discharged home, abdominal pain can be unpredictable. Therefore, you need a repeated exam if your pain does not resolve, returns, or worsens. Most patients with abdominal pain don't have to be admitted to the hospital or have surgery, but serious problems like appendicitis and gallbladder attacks can start out as nonspecific pain. Many abdominal conditions cannot be diagnosed in one visit, so follow-up evaluations are very important.  Your vital signs today were: BP 118/82   Pulse 97   Temp(Src) 98.7 F (37.1 C) (Oral)   Resp 18   SpO2 97%   LMP 10/16/2013 If your blood pressure (bp) was elevated above 135/85 this visit, please have this repeated by your doctor within one month. --------------

## 2013-11-01 NOTE — ED Notes (Signed)
Ultrasound at bedside

## 2013-11-01 NOTE — ED Notes (Signed)
Pt presents with c/o left lower abdominal pain that radiates to her left lower flank area. Pt says that she believed she was ovulating because her left ovary was hurting over the past week and a half. Pt denies nausea, vomiting, diarrhea. Pt says that she has had some pain with urination but denies blood in her urine.

## 2013-11-01 NOTE — ED Provider Notes (Signed)
CSN: [id number]     Arrival date & time 11/01/13  1941 History   First MD Initiated Contact with Patient 11/01/13 2100     Chief Complaint  Patient presents with  . Abdominal Pain  . Flank Pain     (Consider location/radiation/quality/duration/timing/severity/associated sxs/prior Treatment) HPI Comments: Patient with history of C-section, no other abdominal or pelvic surgeries -- presents with complaint of left lower abdomen and pelvic pain that has been gradually worsening for the past 2 weeks. Pain is described as dull and aching. It radiates to her left flank and lower back. She has been taking Aleve with some relief. LMP 10/16/13. Menstrual period has been similar to previous the fact that it lasts approximately 4 days. During this time she has one day of extremely heavy bleeding. She denies fever, nausea, vomiting, diarrhea. Mild dysuria but no hematuria. No other vaginal discharge.    The history is provided by the patient.    Past Medical History  Diagnosis Date  . Diabetes mellitus   . Chicken pox   . Depression   . Allergy     hayfever  . Migraines   . UTI (urinary tract infection)    Past Surgical History  Procedure Laterality Date  . Tonsillectomy    . Cesarean section    . Chiari malformation repair     Family History  Problem Relation Age of Onset  . Heart failure Other   . Drug abuse Other   . Asthma Mother   . Fibroids Mother   . Asthma Father   . Drug abuse Maternal Grandmother   . Drug abuse Maternal Grandfather   . Drug abuse Paternal Grandmother   . Drug abuse Paternal Grandfather    History  Substance Use Topics  . Smoking status: Never Smoker   . Smokeless tobacco: Never Used  . Alcohol Use: No   OB History   Grav Para Term Preterm Abortions TAB SAB Ect Mult Living                 Review of Systems  Constitutional: Negative for fever.  HENT: Negative for rhinorrhea and sore throat.   Eyes: Negative for redness.  Respiratory: Negative for  cough.   Cardiovascular: Negative for chest pain.  Gastrointestinal: Negative for nausea, vomiting, abdominal pain and diarrhea.  Genitourinary: Positive for dysuria, flank pain and pelvic pain. Negative for frequency, hematuria, decreased urine volume, vaginal bleeding and vaginal discharge.  Musculoskeletal: Negative for myalgias.  Skin: Negative for rash.  Neurological: Negative for headaches.      Allergies  Codeine  Home Medications   Current Outpatient Rx  Name  Route  Sig  Dispense  Refill  . acetaminophen (TYLENOL) 325 MG tablet   Oral   Take 650 mg by mouth every 6 (six) hours as needed.         . DULoxetine (CYMBALTA) 20 MG capsule   Oral   Take 1 capsule (20 mg total) by mouth daily.   30 capsule   0   . LANTUS SOLOSTAR 100 UNIT/ML SOPN      INJECT UP TO 40 UNITS SQ AT BEDTIME   15 mL   0   . lidocaine (XYLOCAINE) 2 % solution   Mouth/Throat   Use as directed 20 mLs in the mouth or throat as needed for mouth pain.   100 mL   0   . Multiple Vitamin (MULTIVITAMIN WITH MINERALS) TABS   Oral   Take 1 tablet by mouth  daily.         Marland Kitchen NOVOLOG FLEXPEN 100 UNIT/ML SOPN FlexPen      INJECT 8 UNITS SQ 3 TIMES A DAY BEFORE MEALS   15 mL   3   . Omega-3 Fatty Acids (FISH OIL) 1000 MG CAPS   Oral   Take 1,000 mg by mouth daily.          BP 139/92  Pulse 105  Temp(Src) 98.5 F (36.9 C) (Oral)  Resp 16  SpO2 99%  LMP 10/16/2013  Physical Exam  Nursing note and vitals reviewed. Constitutional: She appears well-developed and well-nourished.  HENT:  Head: Normocephalic and atraumatic.  Eyes: Conjunctivae are normal. Right eye exhibits no discharge. Left eye exhibits no discharge.  Neck: Normal range of motion. Neck supple.  Cardiovascular: Regular rhythm and normal heart sounds.  Tachycardia present.   Mild tachycardia.   Pulmonary/Chest: Effort normal and breath sounds normal.  Abdominal: Soft. There is tenderness (mild/moderate) in the  suprapubic area and left lower quadrant. There is no rigidity, no rebound, no guarding, no CVA tenderness, no tenderness at McBurney's point and negative Murphy's sign.  Genitourinary: There is no rash or lesion on the right labia. There is no rash or lesion on the left labia. Uterus is not enlarged and not tender. Cervix exhibits no motion tenderness, no discharge and no friability. Right adnexum displays no mass, no tenderness and no fullness. Left adnexum displays tenderness. Left adnexum displays no mass and no fullness. No erythema or tenderness around the vagina. Vaginal discharge (mild, thick white) found.  Neurological: She is alert.  Skin: Skin is warm and dry.  Psychiatric: She has a normal mood and affect.    ED Course  Procedures (including critical care time) Labs Review Labs Reviewed  CBC WITH DIFFERENTIAL - Abnormal; Notable for the following:    Hemoglobin 10.5 (*)    HCT 32.1 (*)    MCV 77.9 (*)    MCH 25.5 (*)    Platelets 426 (*)    All other components within normal limits  BASIC METABOLIC PANEL - Abnormal; Notable for the following:    Sodium 135 (*)    Glucose, Bld 319 (*)    Creatinine, Ser 0.49 (*)    All other components within normal limits  GLUCOSE, CAPILLARY - Abnormal; Notable for the following:    Glucose-Capillary 280 (*)    All other components within normal limits  GC/CHLAMYDIA PROBE AMP  WET PREP, GENITAL  URINALYSIS, ROUTINE W REFLEX MICROSCOPIC  HEPATIC FUNCTION PANEL  POCT PREGNANCY, URINE   Imaging Review US Transvaginal Non-ob  11/01/2013   CLINICAL DATA:  33 year old female with left lower quadrant pain. Initial encounter.  EXAM: TRANSABDOMINAL AND TRANSVAGINAL ULTRASOUND OF PELVIS  DOPPLER ULTRASOUND OF OVARIES  TECHNIQUE: Both transabdominal and transvaginal ultrasound examinations of the pelvis were performed. Transabdominal technique was performed for global imaging of the pelvis including uterus, ovaries, adnexal regions, and pelvic  cul-de-sac.  It was necessary to proceed with endovaginal exam following the transabdominal exam to visualize the endometrium and ovaries. Color and duplex Doppler ultrasound was utilized to evaluate blood flow to the ovaries.  COMPARISON:  Ob ultrasound 10/14/2006.  FINDINGS: Uterus  Measurements: 7.5 x 3.9 x 4.5 cm. Largely normal sonographic appearance of the myometrium. See endometrium findings  Endometrium  Thickness: 14 mm. This includes a heterogeneous area at the fundus with vascularity (images 21 -23. Lesion here measures 11 x 10 x 11 mm This distorts the shape of  the endometrial stripe. Elsewhere the stripe appears more homogeneous.  Right ovary  Measurements: 4.7 x 3.3 x 4.2 cm small 11-14 mm complex area most resembles an involuting hemorrhagic cyst. Otherwise normal appearance/no adnexal mass.  Left ovary  Measurements: 4.5 x 3.0 x 2.4 cm multiple small follicles. 16 mm dominant follicle with low level internal echoes. No vascularity at. Normal appearance/no adnexal mass.  Pulsed Doppler evaluation of both ovaries demonstrates normal low-resistance arterial and venous waveforms.  Other findings  No free fluid.  IMPRESSION: 1. No evidence of ovarian torsion. Small bilateral probable physiologic ovarian cysts. No free fluid. 2. Contour deformity of the endometrium at the uterine fundus. Associated 11 mm rounded lesion. Top differential considerations include small submucosal fibroid (favored), and endometrial polyp (less likely). GYN follow-up with sonohysterogram may confirm.   Electronically Signed   By: Augusto Gamble M.D.   On: 11/01/2013 23:32   US Pelvis Complete  11/01/2013   CLINICAL DATA:  33 year old female with left lower quadrant pain. Initial encounter.  EXAM: TRANSABDOMINAL AND TRANSVAGINAL ULTRASOUND OF PELVIS  DOPPLER ULTRASOUND OF OVARIES  TECHNIQUE: Both transabdominal and transvaginal ultrasound examinations of the pelvis were performed. Transabdominal technique was performed for global  imaging of the pelvis including uterus, ovaries, adnexal regions, and pelvic cul-de-sac.  It was necessary to proceed with endovaginal exam following the transabdominal exam to visualize the endometrium and ovaries. Color and duplex Doppler ultrasound was utilized to evaluate blood flow to the ovaries.  COMPARISON:  Ob ultrasound 10/14/2006.  FINDINGS: Uterus  Measurements: 7.5 x 3.9 x 4.5 cm. Largely normal sonographic appearance of the myometrium. See endometrium findings  Endometrium  Thickness: 14 mm. This includes a heterogeneous area at the fundus with vascularity (images 21 -23. Lesion here measures 11 x 10 x 11 mm This distorts the shape of the endometrial stripe. Elsewhere the stripe appears more homogeneous.  Right ovary  Measurements: 4.7 x 3.3 x 4.2 cm small 11-14 mm complex area most resembles an involuting hemorrhagic cyst. Otherwise normal appearance/no adnexal mass.  Left ovary  Measurements: 4.5 x 3.0 x 2.4 cm multiple small follicles. 16 mm dominant follicle with low level internal echoes. No vascularity at. Normal appearance/no adnexal mass.  Pulsed Doppler evaluation of both ovaries demonstrates normal low-resistance arterial and venous waveforms.  Other findings  No free fluid.  IMPRESSION: 1. No evidence of ovarian torsion. Small bilateral probable physiologic ovarian cysts. No free fluid. 2. Contour deformity of the endometrium at the uterine fundus. Associated 11 mm rounded lesion. Top differential considerations include small submucosal fibroid (favored), and endometrial polyp (less likely). GYN follow-up with sonohysterogram may confirm.   Electronically Signed   By: Augusto Gamble M.D.   On: 11/01/2013 23:32   [place] Art/ven Flow Abd Pelv Doppler  11/01/2013   CLINICAL DATA:  33 year old female with left lower quadrant pain. Initial encounter.  EXAM: TRANSABDOMINAL AND TRANSVAGINAL ULTRASOUND OF PELVIS  DOPPLER ULTRASOUND OF OVARIES  TECHNIQUE: Both transabdominal and transvaginal ultrasound  examinations of the pelvis were performed. Transabdominal technique was performed for global imaging of the pelvis including uterus, ovaries, adnexal regions, and pelvic cul-de-sac.  It was necessary to proceed with endovaginal exam following the transabdominal exam to visualize the endometrium and ovaries. Color and duplex Doppler ultrasound was utilized to evaluate blood flow to the ovaries.  COMPARISON:  Ob ultrasound 10/14/2006.  FINDINGS: Uterus  Measurements: 7.5 x 3.9 x 4.5 cm. Largely normal sonographic appearance of the myometrium. See endometrium findings  Endometrium  Thickness:  14 mm. This includes a heterogeneous area at the fundus with vascularity (images 21 -23. Lesion here measures 11 x 10 x 11 mm This distorts the shape of the endometrial stripe. Elsewhere the stripe appears more homogeneous.  Right ovary  Measurements: 4.7 x 3.3 x 4.2 cm small 11-14 mm complex area most resembles an involuting hemorrhagic cyst. Otherwise normal appearance/no adnexal mass.  Left ovary  Measurements: 4.5 x 3.0 x 2.4 cm multiple small follicles. 16 mm dominant follicle with low level internal echoes. No vascularity at. Normal appearance/no adnexal mass.  Pulsed Doppler evaluation of both ovaries demonstrates normal low-resistance arterial and venous waveforms.  Other findings  No free fluid.  IMPRESSION: 1. No evidence of ovarian torsion. Small bilateral probable physiologic ovarian cysts. No free fluid. 2. Contour deformity of the endometrium at the uterine fundus. Associated 11 mm rounded lesion. Top differential considerations include small submucosal fibroid (favored), and endometrial polyp (less likely). GYN follow-up with sonohysterogram may confirm.   Electronically Signed   By: Augusto Gamble M.D.   On: 11/01/2013 23:32    EKG Interpretation   None      9:11 PM Patient seen and examined. Work-up initiated. Medications ordered. Pelvic exam performed with nurse chaperone Leotis Shames).   Vital signs reviewed and  are as follows: Filed Vitals:   11/01/13 1949  BP: 139/92  Pulse: 105  Temp: 98.5 F (36.9 C)  Resp: 16   Patient felt much better prior to discharge. Informed of all results. She states that she will schedule an appointment with her gynecologist for recheck on Monday.  The patient was urged to return to the Emergency Department immediately with worsening of current symptoms, worsening abdominal pain, persistent vomiting, blood noted in stools, fever, or any other concerns. The patient verbalized understanding.   MDM   Final diagnoses:  Pelvic pain  Hyperglycemia  Anemia   Pelvic pain: Wet prep shows yeast infection. UPT negative. Ultrasound significant for small fibroid, small bilateral ovarian cysts. No ovarian torsion. She will follow up with gynecology.  Hypoglycemia: No ketosis. Will continue home insulin.  Anemia: Likely due to blood loss and iron deficiency. She will continue home iron supplementation and follow up with gynecology.  No dangerous or life-threatening conditions suspected or identified by history, physical exam, and by work-up. No indications for hospitalization identified.      Renne Crigler, PA-C 11/02/13 [phone]

## 2013-11-02 MED ORDER — FLUCONAZOLE 150 MG PO TABS: 150.0000 mg | ORAL_TABLET | Freq: Every day | ORAL | Status: DC

## 2013-11-03 LAB — GC/CHLAMYDIA PROBE AMP
CT Probe RNA: NEGATIVE
GC Probe RNA: NEGATIVE

## 2013-11-04 NOTE — ED Provider Notes (Signed)
Medical screening examination/treatment/procedure(s) were performed by non-physician practitioner and as supervising physician I was immediately available for consultation/collaboration.  EKG Interpretation   None         [name] JakesScott W. Mathea Frieling, MD 11/04/13 [phone]

## 2013-12-06 ENCOUNTER — Other Ambulatory Visit: Payer: Self-pay | Admitting: Nurse Practitioner

## 2013-12-08 NOTE — Telephone Encounter (Signed)
Hasn't seen a provider here, other than you?

## 2013-12-08 NOTE — Telephone Encounter (Signed)
This patient really needs to be seen.  No A1c in over a year.  If I ok the refill it can only be for #5 pens and I am afraid patient will not come in.  About 3 months ago I left her some samples in refrig and she never picked up.   Left message for patient to call so we can discuss what we can do to better manage her DM.

## 2013-12-09 ENCOUNTER — Telehealth: Payer: Self-pay | Admitting: Nurse Practitioner

## 2013-12-09 ENCOUNTER — Telehealth: Payer: Self-pay | Admitting: *Deleted

## 2013-12-09 NOTE — Telephone Encounter (Signed)
Tammy will refill Lantus insulin x 1 only. PT must be seen this week. There will be no further refills if not seen. Fawn KirkCathy Holt ok pt to be seen this week and pt will need to call [facility] for them to give us override to see her.  I did call pt and left message med was refilled to [facility] today and to call back tom to make appt.

## 2013-12-09 NOTE — Telephone Encounter (Signed)
I received a refill request yesterday for this patient for her Lantus.  She has not been seen by any one other than myself in over a year.  I have previously explained to her that I cannot function as her PCP.  When I last saw her 06/2013 she refused a A1c because she did not have insurance coverage.  We have been trying to get patient to come in for labs since she received insurance coverage.  I even left sample in December of Lantus which she never picked up.   I feel uncomfortable OK'ing refill when I have not seen patient recently - I need to know more of what is going on and I left a message on 12/08/13 on her VM to that extent.  (see note attached to refill request) I also tried to call patient again today and left a message on her VM to call me.

## 2013-12-10 NOTE — Telephone Encounter (Signed)
Tried to call - left message.  Rx for Lantus has been called yesterday 12/09/13. I notified her that she will need to make appt with a PCP if she wants [facility] to continue to prescribe medications and be her primary care providers.

## 2013-12-24 ENCOUNTER — Emergency Department (HOSPITAL_COMMUNITY)
Admission: EM | Admit: 2013-12-24 | Discharge: 2013-12-24 | Disposition: A | Discharge status: Home / Self Care | Payer: Medicaid Other | Attending: Emergency Medicine | Admitting: Emergency Medicine

## 2013-12-24 ENCOUNTER — Encounter (HOSPITAL_COMMUNITY): Payer: Self-pay | Admitting: Emergency Medicine

## 2013-12-24 ENCOUNTER — Emergency Department (HOSPITAL_COMMUNITY): Payer: Medicaid Other

## 2013-12-24 DIAGNOSIS — Z794 Long term (current) use of insulin: Secondary | ICD-10-CM | POA: Insufficient documentation

## 2013-12-24 DIAGNOSIS — Z87440 Personal history of urinary (tract) infections: Secondary | ICD-10-CM | POA: Insufficient documentation

## 2013-12-24 DIAGNOSIS — M25559 Pain in unspecified hip: Secondary | ICD-10-CM | POA: Insufficient documentation

## 2013-12-24 DIAGNOSIS — Z79899 Other long term (current) drug therapy: Secondary | ICD-10-CM | POA: Insufficient documentation

## 2013-12-24 DIAGNOSIS — Z87828 Personal history of other (healed) physical injury and trauma: Secondary | ICD-10-CM | POA: Insufficient documentation

## 2013-12-24 DIAGNOSIS — M545 Low back pain, unspecified: Secondary | ICD-10-CM | POA: Insufficient documentation

## 2013-12-24 DIAGNOSIS — Z3202 Encounter for pregnancy test, result negative: Secondary | ICD-10-CM | POA: Insufficient documentation

## 2013-12-24 DIAGNOSIS — Z8619 Personal history of other infectious and parasitic diseases: Secondary | ICD-10-CM | POA: Insufficient documentation

## 2013-12-24 DIAGNOSIS — R209 Unspecified disturbances of skin sensation: Secondary | ICD-10-CM | POA: Insufficient documentation

## 2013-12-24 DIAGNOSIS — Z8679 Personal history of other diseases of the circulatory system: Secondary | ICD-10-CM | POA: Insufficient documentation

## 2013-12-24 DIAGNOSIS — E119 Type 2 diabetes mellitus without complications: Secondary | ICD-10-CM | POA: Insufficient documentation

## 2013-12-24 DIAGNOSIS — Z8659 Personal history of other mental and behavioral disorders: Secondary | ICD-10-CM | POA: Insufficient documentation

## 2013-12-24 DIAGNOSIS — R000 Tachycardia, unspecified: Secondary | ICD-10-CM | POA: Insufficient documentation

## 2013-12-24 LAB — POC URINE PREG, ED: PREG TEST UR: NEGATIVE

## 2013-12-24 MED ORDER — MELOXICAM 7.5 MG PO TABS: 15.0000 mg | ORAL_TABLET | Freq: Every day | ORAL | Status: DC

## 2013-12-24 MED ORDER — DIAZEPAM 5 MG PO TABS: 5.0000 mg | ORAL_TABLET | Freq: Two times a day (BID) | ORAL | Status: DC

## 2013-12-24 NOTE — ED Notes (Signed)
Pt states that she fell back in January but her low back pain is getting worse.

## 2013-12-24 NOTE — ED Provider Notes (Signed)
CSN: [id number]     Arrival date & time 12/24/13  0920 History   First MD Initiated Contact with Patient 12/24/13 [phone]     Chief Complaint  Patient presents with  . Fall  . Back Pain     (Consider location/radiation/quality/duration/timing/severity/associated sxs/prior Treatment) HPI Pt is a 33yo female with hx of DM, depression, migraines and UTI presenting to day with lower back pain that has been ongoing since early February when pt reports having falling on the ice twice. Pt was not evaluated by a medical provider at that time as pain was only a dull ache.  Today, pt states pain is constant, aching, 8/10, worse with ambulation and certain movements, including flexion and rotation at her hips. Also reports bilateral hip pain and intermittent numbness in left leg and upper right leg with certain movements. Reports taking flexeril and Vicodin from health department but has not helped with her pain. Denies new falls or injuries since falling in Feb.  Denies fever, n/v/d. Denies change in bowel or bladder habits. Denies urinary or vaginal symptoms. LNMP: 3 weeks ago.  Past Medical History  Diagnosis Date  . Diabetes mellitus   . Chicken pox   . Depression   . Allergy     hayfever  . Migraines   . UTI (urinary tract infection)    Past Surgical History  Procedure Laterality Date  . Tonsillectomy    . Cesarean section    . Chiari malformation repair     Family History  Problem Relation Age of Onset  . Heart failure Other   . Drug abuse Other   . Asthma Mother   . Fibroids Mother   . Asthma Father   . Drug abuse Maternal Grandmother   . Drug abuse Maternal Grandfather   . Drug abuse Paternal Grandmother   . Drug abuse Paternal Grandfather    History  Substance Use Topics  . Smoking status: Never Smoker   . Smokeless tobacco: Never Used  . Alcohol Use: No   OB History   Grav Para Term Preterm Abortions TAB SAB Ect Mult Living                 Review of Systems   Constitutional: Negative for fever and chills.  Respiratory: Negative for shortness of breath.   Cardiovascular: Negative for chest pain.  Gastrointestinal: Negative for nausea, vomiting, abdominal pain, diarrhea and constipation.  Musculoskeletal: Positive for arthralgias ( hips) and back pain ( lower back). Negative for myalgias, neck pain and neck stiffness.  Skin: Negative for color change, rash and wound.  Neurological: Negative for weakness and numbness.  All other systems reviewed and are negative.     Allergies  Codeine  Home Medications   Current Outpatient Rx  Name  Route  Sig  Dispense  Refill  . ferrous sulfate 325 (65 FE) MG tablet   Oral   Take 325 mg by mouth daily with breakfast.         . HYDROcodone-acetaminophen (NORCO/VICODIN) 5-325 MG per tablet      Take 1-2 tablets every 6 hours as needed for severe pain   12 tablet   0   . insulin aspart (NOVOLOG) 100 UNIT/ML injection   Subcutaneous   Inject 0-20 Units into the skin 3 (three) times daily before meals. Less than 80=0 units, 80-120=10 units, 121-175=12 units, 176-200=14 units, 201-250=16 units, 251-300=18 units, 301 or greater=20 units         . insulin glargine (LANTUS) 100  UNIT/ML injection   Subcutaneous   Inject 20 Units into the skin 2 (two) times daily.         . Multiple Vitamin (MULTIVITAMIN WITH MINERALS) TABS   Oral   Take 1 tablet by mouth daily.         . Omega-3 Fatty Acids (FISH OIL) 1000 MG CAPS   Oral   Take 1,000 mg by mouth daily.         . diazepam (VALIUM) 5 MG tablet   Oral   Take 1 tablet (5 mg total) by mouth 2 (two) times daily.   10 tablet   0   . meloxicam (MOBIC) 7.5 MG tablet   Oral   Take 2 tablets (15 mg total) by mouth daily.   30 tablet   0    BP 142/89  Pulse 115  Temp(Src) 98.7 F (37.1 C) (Oral)  Resp 16  SpO2 100%  LMP 12/03/2013 Physical Exam  Nursing note and vitals reviewed. Constitutional: She appears well-developed and  well-nourished. No distress.  HENT:  Head: Normocephalic and atraumatic.  Eyes: Conjunctivae are normal. No scleral icterus.  Neck: Normal range of motion. Neck supple.  No midline bone tenderness, no crepitus or step-offs.   Cardiovascular: Regular rhythm and normal heart sounds.  Tachycardia present.   Pulses:      Dorsalis pedis pulses are 2+ on the right side, and 2+ on the left side.       Posterior tibial pulses are 2+ on the right side, and 2+ on the left side.  Pulmonary/Chest: Effort normal and breath sounds normal. No respiratory distress. She has no wheezes. She has no rales. She exhibits no tenderness.  Abdominal: Soft. She exhibits no distension. There is no tenderness.  Musculoskeletal: Normal range of motion. She exhibits tenderness.  FROM all extremities. Tenderness along lower lumbar spine and paraspinal muscles, L>R.  5/5 strength in all major muscle groups bilaterally.   Neurological: She is alert.  Antalgic gait.    Skin: Skin is warm and dry. She is not diaphoretic.  Psychiatric: She has a normal mood and affect. Her behavior is normal.    ED Course  Procedures (including critical care time) Labs Review Labs Reviewed  POC URINE PREG, ED   Imaging Review Dg Lumbar Spine Complete  12/24/2013   CLINICAL DATA:  Recent fall in February 2015, back pain  EXAM: LUMBAR SPINE - COMPLETE 4+ VIEW  COMPARISON:  None.  FINDINGS: There is no evidence of lumbar spine fracture. Alignment is normal. Intervertebral disc spaces are maintained.  IMPRESSION: Negative.   Electronically Signed   By: Ruel Favorsrevor  Shick M.D.   On: 12/24/2013 10:14     EKG Interpretation None      MDM   Final diagnoses:  Low back pain    Pt c/o low back pain exacerbation after falls on ice in Feb 2015.  Pt was never evaluated at that time.  Denies other injuries. Pt appears well, no red flag symptoms. On exam, tenderness along lower lumbar spine and paraspinal muscles. Antalgic gait. Neurovascularly in  tact. Plain films performed: negative.  Pain likely musculoskeletal in nature. Rx: valium and mobic. Advised to f/u with PCP. Return precautions provided. Pt verbalized understanding and agreement with tx plan.    Junius FinnerErin O'Malley, PA-C 12/25/13 [phone]

## 2013-12-24 NOTE — Discharge Instructions (Signed)
Back Exercises  Back exercises help treat and prevent back injuries. The goal is to increase your strength in your belly (abdominal) and back muscles. These exercises can also help with flexibility. Start these exercises when told by your doctor.  HOME CARE  Back exercises include:  Pelvic Tilt.  · Lie on your back with your knees bent. Tilt your pelvis until the lower part of your back is against the floor. Hold this position 5 to 10 sec. Repeat this exercise 5 to 10 times.  Knee to Chest.  · Pull 1 knee up against your chest and hold for 20 to 30 seconds. Repeat this with the other knee. This may be done with the other leg straight or bent, whichever feels better. Then, pull both knees up against your chest.  Sit-Ups or Curl-Ups.  · Bend your knees 90 degrees. Start with tilting your pelvis, and do a partial, slow sit-up. Only lift your upper half 30 to 45 degrees off the floor. Take at least 2 to 3 seonds for each sit-up. Do not do sit-ups with your knees out straight. If partial sit-ups are difficult, simply do the above but with only tightening your belly (abdominal) muscles and holding it as told.  Hip-Lift.  · Lie on your back with your knees flexed 90 degrees. Push down with your feet and shoulders as you raise your hips 2 inches off the floor. Hold for 10 seconds, repeat 5 to 10 times.  Back Arches.  · Lie on your stomach. Prop yourself up on bent elbows. Slowly press on your hands, causing an arch in your low back. Repeat 3 to 5 times.  Shoulder-Lifts.  · Lie face down with arms beside your body. Keep hips and belly pressed to floor as you slowly lift your head and shoulders off the floor.  Do not overdo your exercises. Be careful in the beginning. Exercises may cause you some mild back discomfort. If the pain lasts for more than 15 minutes, stop the exercises until you see your doctor. Improvement with exercise for back problems is slow.   Document Released: 10/07/2010 Document Revised: 11/27/2011  Document Reviewed: 07/06/2011  [employer]® Patient Information ©2014 [employer].

## 2013-12-25 NOTE — ED Provider Notes (Signed)
Medical screening examination/treatment/procedure(s) were performed by non-physician practitioner and as supervising physician I was immediately available for consultation/collaboration.   EKG Interpretation None      Devoria AlbeIva Shuntell Foody, MD, Armando GangFACEP   Ward GivensIva L Leilany Digeronimo, MD 12/25/13 [phone]

## 2015-03-25 ENCOUNTER — Ambulatory Visit: Payer: Medicaid Other | Admitting: Family Medicine

## 2015-04-14 ENCOUNTER — Ambulatory Visit (INDEPENDENT_AMBULATORY_CARE_PROVIDER_SITE_OTHER): Payer: Managed Care, Other (non HMO) | Admitting: Medical

## 2015-04-14 ENCOUNTER — Encounter: Payer: Self-pay | Admitting: Medical

## 2015-04-14 VITALS — BP 100/80 | HR 97 | Temp 98.1°F | Resp 15 | Wt 148.0 lb

## 2015-04-14 DIAGNOSIS — F329 Major depressive disorder, single episode, unspecified: Secondary | ICD-10-CM | POA: Diagnosis not present

## 2015-04-14 DIAGNOSIS — T300 Burn of unspecified body region, unspecified degree: Secondary | ICD-10-CM

## 2015-04-14 DIAGNOSIS — X12XXXA Contact with other hot fluids, initial encounter: Secondary | ICD-10-CM | POA: Diagnosis not present

## 2015-04-14 DIAGNOSIS — T63331D Toxic effect of venom of brown recluse spider, accidental (unintentional), subsequent encounter: Secondary | ICD-10-CM | POA: Diagnosis not present

## 2015-04-14 DIAGNOSIS — E1041 Type 1 diabetes mellitus with diabetic mononeuropathy: Secondary | ICD-10-CM | POA: Diagnosis not present

## 2015-04-14 DIAGNOSIS — E1040 Type 1 diabetes mellitus with diabetic neuropathy, unspecified: Secondary | ICD-10-CM | POA: Insufficient documentation

## 2015-04-14 DIAGNOSIS — Z3201 Encounter for pregnancy test, result positive: Secondary | ICD-10-CM | POA: Diagnosis not present

## 2015-04-14 DIAGNOSIS — T63331A Toxic effect of venom of brown recluse spider, accidental (unintentional), initial encounter: Secondary | ICD-10-CM | POA: Insufficient documentation

## 2015-04-14 DIAGNOSIS — F32A Depression, unspecified: Secondary | ICD-10-CM

## 2015-04-14 HISTORY — DX: Burn of unspecified body region, unspecified degree: T30.0

## 2015-04-14 HISTORY — DX: Contact with other hot fluids, initial encounter: X12.XXXA

## 2015-04-14 MED ORDER — SILVER SULFADIAZINE 1 % EX CREA: 1.0000 "application " | TOPICAL_CREAM | Freq: Every day | CUTANEOUS | Status: DC
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
## 2015-04-15 ENCOUNTER — Telehealth: Payer: Self-pay | Admitting: Medical

## 2015-04-15 ENCOUNTER — Encounter: Payer: Self-pay | Admitting: Medical

## 2015-04-15 LAB — CBC WITH DIFFERENTIAL/PLATELET
Basophils Absolute: 0 10*3/uL (ref 0.0–0.1)
Basophils Relative: 0 % (ref 0–1)
EOS PCT: 3 % (ref 0–5)
Eosinophils Absolute: 0.3 10*3/uL (ref 0.0–0.7)
HCT: 29.8 % — ABNORMAL LOW (ref 36.0–46.0)
Hemoglobin: 9.4 g/dL — ABNORMAL LOW (ref 12.0–15.0)
LYMPHS ABS: 2.3 10*3/uL (ref 0.7–4.0)
Lymphocytes Relative: 25 % (ref 12–46)
MCH: 24.2 pg — AB (ref 26.0–34.0)
MCHC: 31.5 g/dL (ref 30.0–36.0)
MCV: 76.6 fL — AB (ref 78.0–100.0)
MPV: 8.7 fL (ref 8.6–12.4)
Monocytes Absolute: 0.9 10*3/uL (ref 0.1–1.0)
Monocytes Relative: 10 % (ref 3–12)
Neutro Abs: 5.7 10*3/uL (ref 1.7–7.7)
Neutrophils Relative %: 62 % (ref 43–77)
PLATELETS: 459 10*3/uL — AB (ref 150–400)
RBC: 3.89 MIL/uL (ref 3.87–5.11)
RDW: 16.1 % — AB (ref 11.5–15.5)
WBC: 9.2 10*3/uL (ref 4.0–10.5)

## 2015-04-15 LAB — COMPREHENSIVE METABOLIC PANEL
ALBUMIN: 3.8 g/dL (ref 3.6–5.1)
ALT: 20 U/L (ref 6–29)
AST: 21 U/L (ref 10–30)
Alkaline Phosphatase: 104 U/L (ref 33–115)
BUN: 11 mg/dL (ref 7–25)
CALCIUM: 9.3 mg/dL (ref 8.6–10.2)
CO2: 30 mEq/L (ref 20–31)
CREATININE: 0.55 mg/dL (ref 0.50–1.10)
Chloride: 94 mEq/L — ABNORMAL LOW (ref 98–110)
Glucose, Bld: 136 mg/dL — ABNORMAL HIGH (ref 65–99)
POTASSIUM: 3.4 meq/L — AB (ref 3.5–5.3)
Sodium: 134 mEq/L — ABNORMAL LOW (ref 135–146)
Total Bilirubin: 0.2 mg/dL (ref 0.2–1.2)
Total Protein: 7.1 g/dL (ref 6.1–8.1)

## 2015-04-15 LAB — HEMOGLOBIN A1C
HEMOGLOBIN A1C: 11.1 % — AB (ref <5.7)
Mean Plasma Glucose: 272 mg/dL — ABNORMAL HIGH (ref <117)

## 2015-04-15 LAB — TSH: TSH: 3.893 u[IU]/mL (ref 0.350–4.500)

## 2015-04-15 LAB — HCG, QUANTITATIVE, PREGNANCY: HCG, BETA CHAIN, QUANT, S: 40179.8 m[IU]/mL

## 2015-04-15 NOTE — Telephone Encounter (Signed)
Reported all to patient. Faxed notes to Dr Estanislado Pandy. Working on referral to [employer]

## 2015-04-15 NOTE — Progress Notes (Signed)
LM to CB

## 2015-04-15 NOTE — Telephone Encounter (Signed)
error 

## 2015-04-15 NOTE — Telephone Encounter (Signed)
See other lab results message.    Refer to Dr. Talmage Nap for endocrinology.  She already has appt with Dr. Ranae Plumber for 04/19/15 for pregnancy.    Please fax my OV notes, labs to Dr. Estanislado Pandy at [phone]

## 2015-04-21 ENCOUNTER — Encounter (HOSPITAL_BASED_OUTPATIENT_CLINIC_OR_DEPARTMENT_OTHER): Payer: Medicaid Other

## 2015-04-29 ENCOUNTER — Telehealth: Payer: Self-pay | Admitting: Medical

## 2015-04-29 NOTE — Telephone Encounter (Signed)
[phone] pt has appt with Dr. Talmage Nap 06/09/15 at 9:00 Left message for pt

## 2015-06-06 ENCOUNTER — Encounter (HOSPITAL_COMMUNITY): Payer: Self-pay | Admitting: *Deleted

## 2015-06-06 ENCOUNTER — Emergency Department (HOSPITAL_COMMUNITY)
Admission: EM | Admit: 2015-06-06 | Discharge: 2015-06-06 | Disposition: A | Discharge status: Home / Self Care | Payer: Managed Care, Other (non HMO) | Attending: Emergency Medicine | Admitting: Emergency Medicine

## 2015-06-06 DIAGNOSIS — O2342 Unspecified infection of urinary tract in pregnancy, second trimester: Secondary | ICD-10-CM | POA: Insufficient documentation

## 2015-06-06 DIAGNOSIS — Z794 Long term (current) use of insulin: Secondary | ICD-10-CM | POA: Diagnosis not present

## 2015-06-06 DIAGNOSIS — Z8679 Personal history of other diseases of the circulatory system: Secondary | ICD-10-CM | POA: Diagnosis not present

## 2015-06-06 DIAGNOSIS — Z3A15 15 weeks gestation of pregnancy: Secondary | ICD-10-CM | POA: Insufficient documentation

## 2015-06-06 DIAGNOSIS — O24112 Pre-existing diabetes mellitus, type 2, in pregnancy, second trimester: Secondary | ICD-10-CM | POA: Diagnosis not present

## 2015-06-06 DIAGNOSIS — Z8619 Personal history of other infectious and parasitic diseases: Secondary | ICD-10-CM | POA: Diagnosis not present

## 2015-06-06 DIAGNOSIS — Z79899 Other long term (current) drug therapy: Secondary | ICD-10-CM | POA: Diagnosis not present

## 2015-06-06 DIAGNOSIS — F329 Major depressive disorder, single episode, unspecified: Secondary | ICD-10-CM | POA: Insufficient documentation

## 2015-06-06 DIAGNOSIS — N390 Urinary tract infection, site not specified: Secondary | ICD-10-CM

## 2015-06-06 DIAGNOSIS — O99342 Other mental disorders complicating pregnancy, second trimester: Secondary | ICD-10-CM | POA: Insufficient documentation

## 2015-06-06 LAB — URINALYSIS, ROUTINE W REFLEX MICROSCOPIC
Glucose, UA: NEGATIVE mg/dL
KETONES UR: 15 mg/dL — AB
NITRITE: NEGATIVE
PH: 6.5 (ref 5.0–8.0)
Protein, ur: 100 mg/dL — AB
SPECIFIC GRAVITY, URINE: 1.034 — AB (ref 1.005–1.030)
UROBILINOGEN UA: 1 mg/dL (ref 0.0–1.0)

## 2015-06-06 LAB — URINE MICROSCOPIC-ADD ON

## 2015-06-06 LAB — CBG MONITORING, ED: Glucose-Capillary: 68 mg/dL (ref 65–99)

## 2015-06-06 MED ORDER — CEPHALEXIN 500 MG PO CAPS: 500.0000 mg | ORAL_CAPSULE | Freq: Four times a day (QID) | ORAL | Status: DC

## 2015-06-06 MED ORDER — CEPHALEXIN 500 MG PO CAPS
500.0000 mg | ORAL_CAPSULE | Freq: Once | ORAL | Status: AC
  Administered 2015-06-06: 500 mg via ORAL
  Filled 2015-06-06: qty 1

## 2015-06-06 NOTE — ED Provider Notes (Signed)
CSN: [id number]     Arrival date & time 06/06/15  1552 History   First MD Initiated Contact with Patient 06/06/15 1631     Chief Complaint  Patient presents with  . Urinary Retention     (Consider location/radiation/quality/duration/timing/severity/associated sxs/prior Treatment) HPI Comments: The patient is a 34 year old female, she has a history of pregnancy, she is currently [redacted] weeks pregnant. She denies any fevers chills nausea vomiting or back pain but has had significant urinary discomfort, frequency and retention, she feels urgency but is unable to pass any urine over the last several hours. There is no abdominal pain. She has no other consultations with the pregnancy other than her diabetes which she states is brittle. Her sugars have ranged from 100-200 today.  She called her OB/GYN over the last couple of days and was supposed to go to the doctor's office tomorrow for urinalysis and antibiotics because of dysuria  The history is provided by the patient.    Past Medical History  Diagnosis Date  . Diabetes mellitus   . Chicken pox   . Depression   . Allergy     hayfever  . Migraines   . UTI (urinary tract infection)    Past Surgical History  Procedure Laterality Date  . Tonsillectomy    . Cesarean section    . Chiari malformation repair     Family History  Problem Relation Age of Onset  . Heart failure Other   . Drug abuse Other   . Asthma Mother   . Fibroids Mother   . Asthma Father   . Drug abuse Maternal Grandmother   . Drug abuse Maternal Grandfather   . Drug abuse Paternal Grandmother   . Drug abuse Paternal Grandfather    Social History  Substance Use Topics  . Smoking status: Never Smoker   . Smokeless tobacco: Never Used  . Alcohol Use: No   OB History    No data available     Review of Systems  All other systems reviewed and are negative.     Allergies  Codeine and Sulfa antibiotics  Home Medications   Prior to Admission medications    Medication Sig Start Date End Date Taking? Authorizing Provider  acetaminophen (TYLENOL) 325 MG tablet Take 325 mg by mouth every 6 (six) hours as needed for headache.   Yes Historical Provider, MD  cetirizine (ZYRTEC) 10 MG tablet Take 10 mg by mouth daily.   Yes Historical Provider, MD  escitalopram (LEXAPRO) 20 MG tablet Take 20 mg by mouth daily.   Yes Historical Provider, MD  ferrous sulfate 325 (65 FE) MG tablet Take 325 mg by mouth daily with breakfast.   Yes Historical Provider, MD  insulin aspart (NOVOLOG) 100 UNIT/ML injection Inject 0-20 Units into the skin 3 (three) times daily before meals. Less than 80=0 units, 80-120=10 units, 121-175=12 units, 176-200=14 units, 201-250=16 units, 251-300=18 units, 301 or greater=20 units   Yes Historical Provider, MD  insulin glargine (LANTUS) 100 UNIT/ML injection Inject 20 Units into the skin 2 (two) times daily.   Yes Historical Provider, MD  Prenatal Vit-Fe Fumarate-FA (PRENATAL MULTIVITAMIN) TABS tablet Take 1 tablet by mouth daily at 12 noon.   Yes Historical Provider, MD  cephALEXin (KEFLEX) 500 MG capsule Take 1 capsule (500 mg total) by mouth 4 (four) times daily. 06/06/15   Eber Hong, MD  silver sulfADIAZINE (SILVADENE) 1 % cream Apply 1 application topically daily. 04/14/15   Kermit Balo Tysinger, PA-C   BP 115/77  mmHg  Pulse 107  Temp(Src) 98.1 F (36.7 C) (Oral)  Resp 17  SpO2 100%  LMP 02/03/2015 Physical Exam  Constitutional: She appears well-developed and well-nourished. No distress.  HENT:  Head: Normocephalic and atraumatic.  Mouth/Throat: Oropharynx is clear and moist. No oropharyngeal exudate.  Eyes: Conjunctivae and EOM are normal. Pupils are equal, round, and reactive to light. Right eye exhibits no discharge. Left eye exhibits no discharge. No scleral icterus.  Neck: Normal range of motion. Neck supple. No JVD present. No thyromegaly present.  Cardiovascular: Normal rate, regular rhythm, normal heart sounds and intact  distal pulses.  Exam reveals no gallop and no friction rub.   No murmur heard. Pulmonary/Chest: Effort normal and breath sounds normal. No respiratory distress. She has no wheezes. She has no rales.  Abdominal: Soft. Bowel sounds are normal. She exhibits no distension and no mass. There is no tenderness.  No tenderness, fullness in the suprapubic region, no guarding, no flank pain, no CVA tenderness  Musculoskeletal: Normal range of motion. She exhibits no edema or tenderness.  Lymphadenopathy:    She has no cervical adenopathy.  Neurological: She is alert. Coordination normal.  Skin: Skin is warm and dry. No rash noted. No erythema.  Psychiatric: She has a normal mood and affect. Her behavior is normal.  Nursing note and vitals reviewed.   ED Course  Procedures (including critical care time) Labs Review Labs Reviewed  URINALYSIS, ROUTINE W REFLEX MICROSCOPIC (NOT AT [facility]) - Abnormal; Notable for the following:    Color, Urine AMBER (*)    APPearance CLOUDY (*)    Specific Gravity, Urine 1.034 (*)    Hgb urine dipstick MODERATE (*)    Bilirubin Urine SMALL (*)    Ketones, ur 15 (*)    Protein, ur 100 (*)    Leukocytes, UA MODERATE (*)    All other components within normal limits  URINE CULTURE  URINE MICROSCOPIC-ADD ON  CBG MONITORING, ED    Imaging Review No results found. I have personally reviewed and evaluated these images and lab results as part of my medical decision-making.    MDM   Final diagnoses:  UTI (lower urinary tract infection)    The patient is not tachycardic on my exam, her abdominal exam is consistent with her gestational age, bedside bladder scan shows 200 mL of urine, this does not appear to be a retention issue as much as it does a infection issue.  We'll obtain a urinalysis, culture, this does not appear to be urinary retention. No fever, no systemic symptoms.  UTI present - no fevers - abx given ptd.  Meds given in ED:  Medications   cephALEXin (KEFLEX) capsule 500 mg (not administered)    New Prescriptions   CEPHALEXIN (KEFLEX) 500 MG CAPSULE    Take 1 capsule (500 mg total) by mouth 4 (four) times daily.      Eber Hong, MD 06/06/15 [phone]

## 2015-06-06 NOTE — ED Notes (Signed)
[name] 2nd for I&O cath procedure.

## 2015-06-06 NOTE — ED Notes (Signed)
Foley catheter was NOT placed.  I and O for urine sample.  Total output 

## 2015-06-06 NOTE — ED Notes (Signed)
MD at bedside. 

## 2015-06-06 NOTE — Discharge Instructions (Signed)

## 2015-06-06 NOTE — ED Notes (Signed)
Pt given sandwich and Sprite

## 2015-06-06 NOTE — ED Notes (Signed)
Pt given Sprite 

## 2015-06-06 NOTE — ED Notes (Signed)
Pt reports urinary retention today.  Last time she was able to urinate was about an hour and a half ago.  Pt is [redacted] weeks pregnant.

## 2015-06-09 LAB — URINE CULTURE: Culture: 30000

## 2015-06-10 ENCOUNTER — Telehealth (HOSPITAL_BASED_OUTPATIENT_CLINIC_OR_DEPARTMENT_OTHER): Payer: Self-pay | Admitting: Emergency Medicine

## 2015-06-10 ENCOUNTER — Encounter: Payer: Managed Care, Other (non HMO) | Attending: Endocrinology | Admitting: *Deleted

## 2015-06-10 ENCOUNTER — Encounter: Payer: Self-pay | Admitting: *Deleted

## 2015-06-10 DIAGNOSIS — O24012 Pre-existing diabetes mellitus, type 1, in pregnancy, second trimester: Secondary | ICD-10-CM | POA: Insufficient documentation

## 2015-06-10 DIAGNOSIS — Z713 Dietary counseling and surveillance: Secondary | ICD-10-CM | POA: Insufficient documentation

## 2015-06-10 NOTE — Progress Notes (Signed)
ED Antimicrobial Stewardship Positive Culture Follow Up   [name] is an 34 y.o. female who presented to [facility] on 06/06/2015 with a chief complaint of  Chief Complaint  Patient presents with  . Urinary Retention    Recent Results (from the past 720 hour(s))  Urine culture     Status: None   Collection Time: 06/06/15  5:00 PM  Result Value Ref Range Status   Specimen Description URINE, CATHETERIZED  Final   Special Requests NONE  Final   Culture   Final    30,000 COLONIES/mL STAPHYLOCOCCUS SPECIES (COAGULASE NEGATIVE) Performed at [facility]    Report Status 06/09/2015 FINAL  Final   Organism ID, Bacteria STAPHYLOCOCCUS SPECIES (COAGULASE NEGATIVE)  Final      Susceptibility   Staphylococcus species (coagulase negative) - MIC*    CIPROFLOXACIN <=0.5 SENSITIVE Sensitive     GENTAMICIN <=0.5 SENSITIVE Sensitive     NITROFURANTOIN <=16 SENSITIVE Sensitive     OXACILLIN >=4 RESISTANT Resistant     TETRACYCLINE 2 SENSITIVE Sensitive     VANCOMYCIN 1 SENSITIVE Sensitive     TRIMETH/SULFA 160 RESISTANT Resistant     CLINDAMYCIN >=8 RESISTANT Resistant     RIFAMPIN <=0.5 SENSITIVE Sensitive     Inducible Clindamycin NEGATIVE Sensitive     * 30,000 COLONIES/mL STAPHYLOCOCCUS SPECIES (COAGULASE NEGATIVE)     Treated with cephalexin, organism resistant to prescribed antimicrobial  New antibiotic prescription: Macrobid 100 mg PO BID x 5 days  ED Provider: Cammy Copa L. Manuela Neptune   Greggory Stallion, PharmD Clinical Pharmacy Resident Pager # [phone] 06/10/2015 9:34 AM   Infectious Diseases Pharmacist Phone# [phone]

## 2015-06-11 ENCOUNTER — Telehealth (HOSPITAL_BASED_OUTPATIENT_CLINIC_OR_DEPARTMENT_OTHER): Payer: Self-pay | Admitting: Emergency Medicine

## 2015-06-13 ENCOUNTER — Telehealth (HOSPITAL_COMMUNITY): Payer: Self-pay

## 2015-06-13 NOTE — Telephone Encounter (Signed)
Unable to reach by telephone. Letter sent to address on record.  

## 2015-06-15 NOTE — Progress Notes (Signed)
  Patient was seen on 06/10/15 for Diabetes self-management of X5AV complicated by pregnancy. [name] is presently on NPH Am & PM and sliding scale to correct prior to meals. This is confirmed with Dr. Chalmers Cater. The following learning objectives were met by the patient :   States why dietary management is important in controlling blood glucose  Describes the effects of carbohydrates on blood glucose levels  Demonstrates ability to create a balanced meal plan  Demonstrates carbohydrate counting   States when to check blood glucose levels  States the effect of stress and exercise on blood glucose levels  Plan:  Aim for 2 Carb Choices per meal (30 grams) +/- 1 either way for breakfast Aim for 3 Carb Choices per meal (45 grams) +/- 1 either way from lunch and dinner Aim for 1-2 Carbs per snack Begin reading food labels for Total Carbohydrate and sugar grams of foods Consider  increasing your activity level by walking daily as tolerated Begin checking BG before breakfast and 2 hours after first bit of breakfast, lunch and dinner after  as directed by MD  Take medication  as directed by MD  Blood glucose monitor given: None - already testing  Patient instructed to monitor glucose levels: FBS: 60 - <90 2 hour: <120  Patient received the following handouts:  Nutrition Diabetes and Pregnancy  Carbohydrate Counting List  Meal Planning worksheet  [name] noted that although she has had T1DM for an long time she really doesn't know much about diabetes. I suggested that following the delivery of her baby that she consider coming back for diabetes Self-Management Education. She thought this was a good idea.

## 2015-06-16 ENCOUNTER — Telehealth: Payer: Self-pay | Admitting: Medical

## 2015-06-16 NOTE — Telephone Encounter (Signed)
Dr. Willeen Cass office called and stated pt did not show up for her follow up appt. They wanted you to be aware.

## 2015-06-21 NOTE — Telephone Encounter (Signed)
Please call to inquire?  Needs to be seeing endocrinology.

## 2015-06-21 NOTE — Telephone Encounter (Signed)
LMTCB

## 2015-06-22 NOTE — Telephone Encounter (Signed)
Called pt again and LMTCB  

## 2015-06-24 NOTE — Telephone Encounter (Signed)
Called and got no answer.

## 2015-06-24 NOTE — Telephone Encounter (Signed)
Sent letter

## 2015-07-20 ENCOUNTER — Telehealth (HOSPITAL_COMMUNITY): Payer: Self-pay | Admitting: *Deleted

## 2015-07-24 ENCOUNTER — Telehealth (HOSPITAL_COMMUNITY): Payer: Self-pay

## 2015-07-24 NOTE — Telephone Encounter (Signed)
Unable to contact pt by mail or telephone. Unable to communicate lab results or treatment changes. 

## 2015-07-28 ENCOUNTER — Telehealth (HOSPITAL_COMMUNITY): Payer: Self-pay | Admitting: *Deleted

## 2015-09-26 ENCOUNTER — Inpatient Hospital Stay (HOSPITAL_COMMUNITY)
Admission: AD | Admit: 2015-09-26 | Discharge: 2015-09-30 | DRG: 774 | Disposition: A | Discharge status: Home / Self Care | Payer: Medicaid Other | Source: Ambulatory Visit | Attending: Obstetrics & Gynecology | Admitting: Obstetrics & Gynecology

## 2015-09-26 ENCOUNTER — Encounter (HOSPITAL_COMMUNITY): Payer: Self-pay | Admitting: *Deleted

## 2015-09-26 DIAGNOSIS — F419 Anxiety disorder, unspecified: Secondary | ICD-10-CM | POA: Diagnosis present

## 2015-09-26 DIAGNOSIS — E1040 Type 1 diabetes mellitus with diabetic neuropathy, unspecified: Secondary | ICD-10-CM | POA: Diagnosis present

## 2015-09-26 DIAGNOSIS — O368130 Decreased fetal movements, third trimester, not applicable or unspecified: Secondary | ICD-10-CM | POA: Diagnosis present

## 2015-09-26 DIAGNOSIS — O99344 Other mental disorders complicating childbirth: Secondary | ICD-10-CM | POA: Diagnosis present

## 2015-09-26 DIAGNOSIS — O34219 Maternal care for unspecified type scar from previous cesarean delivery: Secondary | ICD-10-CM | POA: Diagnosis present

## 2015-09-26 DIAGNOSIS — O24013 Pre-existing diabetes mellitus, type 1, in pregnancy, third trimester: Secondary | ICD-10-CM | POA: Diagnosis not present

## 2015-09-26 DIAGNOSIS — O1403 Mild to moderate pre-eclampsia, third trimester: Secondary | ICD-10-CM | POA: Diagnosis present

## 2015-09-26 DIAGNOSIS — Z882 Allergy status to sulfonamides status: Secondary | ICD-10-CM | POA: Diagnosis not present

## 2015-09-26 DIAGNOSIS — E109 Type 1 diabetes mellitus without complications: Secondary | ICD-10-CM | POA: Diagnosis present

## 2015-09-26 DIAGNOSIS — Z3A31 31 weeks gestation of pregnancy: Secondary | ICD-10-CM

## 2015-09-26 DIAGNOSIS — Z885 Allergy status to narcotic agent status: Secondary | ICD-10-CM

## 2015-09-26 DIAGNOSIS — O1490 Unspecified pre-eclampsia, unspecified trimester: Secondary | ICD-10-CM

## 2015-09-26 DIAGNOSIS — F329 Major depressive disorder, single episode, unspecified: Secondary | ICD-10-CM | POA: Diagnosis present

## 2015-09-26 DIAGNOSIS — O09523 Supervision of elderly multigravida, third trimester: Secondary | ICD-10-CM | POA: Diagnosis not present

## 2015-09-26 DIAGNOSIS — O24913 Unspecified diabetes mellitus in pregnancy, third trimester: Secondary | ICD-10-CM

## 2015-09-26 DIAGNOSIS — Z794 Long term (current) use of insulin: Secondary | ICD-10-CM | POA: Diagnosis not present

## 2015-09-26 LAB — URINALYSIS, ROUTINE W REFLEX MICROSCOPIC
Bilirubin Urine: NEGATIVE
Glucose, UA: >1000 mg/dL — AB
HGB URINE DIPSTICK: NEGATIVE
KETONES UR: 15 mg/dL — AB
Leukocytes, UA: NEGATIVE
Nitrite: NEGATIVE
PROTEIN: NEGATIVE mg/dL
Specific Gravity, Urine: <1.005 — ABNORMAL LOW (ref 1.005–1.030)
pH: 6.5 (ref 5.0–8.0)

## 2015-09-26 LAB — COMPREHENSIVE METABOLIC PANEL
ALBUMIN: 3.2 g/dL — AB (ref 3.5–5.0)
ALK PHOS: 101 U/L (ref 38–126)
ALT: 19 U/L (ref 14–54)
ANION GAP: 9 (ref 5–15)
AST: 23 U/L (ref 15–41)
BILIRUBIN TOTAL: 0.3 mg/dL (ref 0.3–1.2)
BUN: 12 mg/dL (ref 6–20)
CO2: 25 mmol/L (ref 22–32)
Calcium: 8.8 mg/dL — ABNORMAL LOW (ref 8.9–10.3)
Chloride: 99 mmol/L — ABNORMAL LOW (ref 101–111)
Creatinine, Ser: 0.6 mg/dL (ref 0.44–1.00)
Glucose, Bld: 332 mg/dL — ABNORMAL HIGH (ref 65–99)
POTASSIUM: 4.3 mmol/L (ref 3.5–5.1)
Sodium: 133 mmol/L — ABNORMAL LOW (ref 135–145)
TOTAL PROTEIN: 7.5 g/dL (ref 6.5–8.1)

## 2015-09-26 LAB — CBC
HEMATOCRIT: 34.2 % — AB (ref 36.0–46.0)
Hemoglobin: 11.3 g/dL — ABNORMAL LOW (ref 12.0–15.0)
MCH: 29.9 pg (ref 26.0–34.0)
MCHC: 33 g/dL (ref 30.0–36.0)
MCV: 90.5 fL (ref 78.0–100.0)
Platelets: 283 10*3/uL (ref 150–400)
RBC: 3.78 MIL/uL — ABNORMAL LOW (ref 3.87–5.11)
RDW: 14.6 % (ref 11.5–15.5)
WBC: 11.6 10*3/uL — ABNORMAL HIGH (ref 4.0–10.5)

## 2015-09-26 LAB — WET PREP, GENITAL
Clue Cells Wet Prep HPF POC: NONE SEEN
SPERM: NONE SEEN
Trich, Wet Prep: NONE SEEN
YEAST WET PREP: NONE SEEN

## 2015-09-26 LAB — TYPE AND SCREEN
ABO/RH(D): B POS
ANTIBODY SCREEN: NEGATIVE

## 2015-09-26 LAB — URINE MICROSCOPIC-ADD ON

## 2015-09-26 LAB — PROTEIN / CREATININE RATIO, URINE
Creatinine, Urine: 56 mg/dL
Protein Creatinine Ratio: 0.36 mg/mg{Cre} — ABNORMAL HIGH (ref 0.00–0.15)
TOTAL PROTEIN, URINE: 20 mg/dL

## 2015-09-26 LAB — LACTATE DEHYDROGENASE: LDH: 161 U/L (ref 98–192)

## 2015-09-26 LAB — FETAL FIBRONECTIN: FETAL FIBRONECTIN: NEGATIVE

## 2015-09-26 LAB — GLUCOSE, CAPILLARY: Glucose-Capillary: 187 mg/dL — ABNORMAL HIGH (ref 65–99)

## 2015-09-26 LAB — URIC ACID: Uric Acid, Serum: 5.2 mg/dL (ref 2.3–6.6)

## 2015-09-26 MED ORDER — HYDRALAZINE HCL 20 MG/ML IJ SOLN: 10.0000 mg | Freq: Once | INTRAMUSCULAR | Status: DC | PRN

## 2015-09-26 MED ORDER — ZOLPIDEM TARTRATE 5 MG PO TABS
5.0000 mg | ORAL_TABLET | Freq: Every evening | ORAL | Status: DC | PRN
  Administered 2015-09-27 – 2015-09-29 (×3): 5 mg via ORAL
  Filled 2015-09-26 (×3): qty 1

## 2015-09-26 MED ORDER — PRENATAL MULTIVITAMIN CH
1.0000 | ORAL_TABLET | Freq: Every day | ORAL | Status: DC
Start: 2015-09-27 — End: 2015-09-30
  Administered 2015-09-27 – 2015-09-30 (×4): 1 via ORAL
  Filled 2015-09-26 (×4): qty 1

## 2015-09-26 MED ORDER — INSULIN NPH (HUMAN) (ISOPHANE) 100 UNIT/ML ~~LOC~~ SUSP
20.0000 [IU] | Freq: Every day | SUBCUTANEOUS | Status: DC
  Administered 2015-09-27: 20 [IU] via SUBCUTANEOUS

## 2015-09-26 MED ORDER — INSULIN NPH (HUMAN) (ISOPHANE) 100 UNIT/ML ~~LOC~~ SUSP
24.0000 [IU] | Freq: Every day | SUBCUTANEOUS | Status: DC
  Administered 2015-09-27: 24 [IU] via SUBCUTANEOUS
  Filled 2015-09-26: qty 10

## 2015-09-26 MED ORDER — DOCUSATE SODIUM 100 MG PO CAPS
100.0000 mg | ORAL_CAPSULE | Freq: Every day | ORAL | Status: DC
  Administered 2015-09-28 – 2015-09-30 (×3): 100 mg via ORAL
  Filled 2015-09-26 (×3): qty 1

## 2015-09-26 MED ORDER — CALCIUM CARBONATE ANTACID 500 MG PO CHEW: 2.0000 | CHEWABLE_TABLET | ORAL | Status: DC | PRN

## 2015-09-26 MED ORDER — BETAMETHASONE SOD PHOS & ACET 6 (3-3) MG/ML IJ SUSP
12.0000 mg | Freq: Once | INTRAMUSCULAR | Status: AC
  Administered 2015-09-26: 12 mg via INTRAMUSCULAR
  Filled 2015-09-26: qty 2

## 2015-09-26 MED ORDER — ACETAMINOPHEN 325 MG PO TABS
650.0000 mg | ORAL_TABLET | ORAL | Status: DC | PRN
  Administered 2015-09-29 – 2015-09-30 (×4): 650 mg via ORAL
  Filled 2015-09-26 (×4): qty 2

## 2015-09-26 MED ORDER — BETAMETHASONE SOD PHOS & ACET 6 (3-3) MG/ML IJ SUSP
12.0000 mg | Freq: Once | INTRAMUSCULAR | Status: AC
  Administered 2015-09-27: 12 mg via INTRAMUSCULAR
  Filled 2015-09-26: qty 2

## 2015-09-26 MED ORDER — LABETALOL HCL 5 MG/ML IV SOLN: 20.0000 mg | INTRAVENOUS | Status: DC | PRN

## 2015-09-26 NOTE — MAU Note (Signed)
Pt reports increased pelvic pressure(constant) that started earlier this afternoon. Pt also reports decreased fetal movement.
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
## 2015-09-27 ENCOUNTER — Inpatient Hospital Stay (HOSPITAL_COMMUNITY): Payer: Medicaid Other

## 2015-09-27 DIAGNOSIS — O1490 Unspecified pre-eclampsia, unspecified trimester: Secondary | ICD-10-CM | POA: Diagnosis present

## 2015-09-27 LAB — GLUCOSE, CAPILLARY
GLUCOSE-CAPILLARY: 254 mg/dL — AB (ref 65–99)
GLUCOSE-CAPILLARY: 290 mg/dL — AB (ref 65–99)
GLUCOSE-CAPILLARY: 229 mg/dL — AB (ref 65–99)
GLUCOSE-CAPILLARY: 208 mg/dL — AB (ref 65–99)
Glucose-Capillary: 235 mg/dL — ABNORMAL HIGH (ref 65–99)
Glucose-Capillary: 254 mg/dL — ABNORMAL HIGH (ref 65–99)

## 2015-09-27 LAB — GC/CHLAMYDIA PROBE AMP (~~LOC~~) NOT AT ARMC
Chlamydia: NEGATIVE
Neisseria Gonorrhea: NEGATIVE

## 2015-09-27 LAB — MRSA PCR SCREENING: MRSA BY PCR: NEGATIVE

## 2015-09-27 LAB — ABO/RH: ABO/RH(D): B POS

## 2015-09-27 MED ORDER — INSULIN NPH (HUMAN) (ISOPHANE) 100 UNIT/ML ~~LOC~~ SUSP
28.0000 [IU] | Freq: Every day | SUBCUTANEOUS | Status: DC
  Administered 2015-09-27 – 2015-09-29 (×3): 28 [IU] via SUBCUTANEOUS

## 2015-09-27 MED ORDER — INSULIN NPH (HUMAN) (ISOPHANE) 100 UNIT/ML ~~LOC~~ SUSP
24.0000 [IU] | Freq: Every day | SUBCUTANEOUS | Status: DC
  Administered 2015-09-28 – 2015-09-30 (×3): 24 [IU] via SUBCUTANEOUS

## 2015-09-27 MED ORDER — LOPERAMIDE HCL 2 MG PO CAPS
2.0000 mg | ORAL_CAPSULE | ORAL | Status: DC | PRN
  Administered 2015-09-27: 4 mg via ORAL
  Filled 2015-09-27 (×2): qty 2

## 2015-09-27 MED ORDER — INSULIN ASPART 100 UNIT/ML ~~LOC~~ SOLN
0.0000 [IU] | Freq: Three times a day (TID) | SUBCUTANEOUS | Status: DC
  Administered 2015-09-27: 5 [IU] via SUBCUTANEOUS
  Administered 2015-09-27 – 2015-09-28 (×3): 6 [IU] via SUBCUTANEOUS
  Administered 2015-09-28 (×2): 5 [IU] via SUBCUTANEOUS

## 2015-09-27 NOTE — Progress Notes (Addendum)
Antepartum LOS:  [name], 35 y.o.,   OB History    Gravida Para Term Preterm AB TAB SAB Ectopic Multiple Living   2 1 1       1       Subjective -Patient resting in bed.  Endorses good fetal movement and denies LOF, VB, and contractions.  Patient further denies HA, visual disturbances, SoB, dizziness, or epigastric pain.  Patient expresses desire for discharge today stating that her young daughter is at home and her husband is out of town.  Patient states daughter is with her grandmother, but that she would rather be at home caring for her.  Patient reports some loose stools this am and reports that this has been a common occurrence throughout the pregnancy.  Patient states she usually gets relief with imodium.    Objective  Filed Vitals:   09/27/15 0008 09/27/15 0315 09/27/15 0647 09/27/15 0800  BP:  138/90  142/93  Pulse:  86  98  Temp:    98.2 F (36.8 C)  TempSrc:    Oral  Resp: 18 18 18 18   Height:      Weight:      SpO2:        Results for orders placed or performed during the hospital encounter of 09/26/15 (from the past 24 hour(s))  Urinalysis, Routine w reflex microscopic (not at [facility])     Status: Abnormal   Collection Time: 09/26/15  6:35 PM  Result Value Ref Range   Color, Urine YELLOW YELLOW   APPearance CLEAR CLEAR   Specific Gravity, Urine <1.005 (L) 1.005 - 1.030   pH 6.5 5.0 - 8.0   Glucose, UA >1000 (A) NEGATIVE mg/dL   Hgb urine dipstick NEGATIVE NEGATIVE   Bilirubin Urine NEGATIVE NEGATIVE   Ketones, ur 15 (A) NEGATIVE mg/dL   Protein, ur NEGATIVE NEGATIVE mg/dL   Nitrite NEGATIVE NEGATIVE   Leukocytes, UA NEGATIVE NEGATIVE  Urine microscopic-add on     Status: Abnormal   Collection Time: 09/26/15  6:35 PM  Result Value Ref Range   Squamous Epithelial / LPF 0-5 (A) NONE SEEN   WBC, UA 0-5 0 - 5 WBC/hpf   RBC / HPF 0-5 0 - 5 RBC/hpf   Bacteria, UA FEW (A) NONE SEEN  Protein / creatinine ratio, urine     Status: Abnormal   Collection Time:  09/26/15  6:35 PM  Result Value Ref Range   Creatinine, Urine 56.00 mg/dL   Total Protein, Urine 20 mg/dL   Protein Creatinine Ratio 0.36 (H) 0.00 - 0.15 mg/mg[Cre]  Wet prep, genital     Status: Abnormal   Collection Time: 09/26/15  7:33 PM  Result Value Ref Range   Yeast Wet Prep HPF POC NONE SEEN NONE SEEN   Trich, Wet Prep NONE SEEN NONE SEEN   Clue Cells Wet Prep HPF POC NONE SEEN NONE SEEN   WBC, Wet Prep HPF POC MODERATE (A) NONE SEEN   Sperm NONE SEEN   Fetal fibronectin     Status: None   Collection Time: 09/26/15  7:33 PM  Result Value Ref Range   Fetal Fibronectin NEGATIVE NEGATIVE  CBC     Status: Abnormal   Collection Time: 09/26/15  8:10 PM  Result Value Ref Range   WBC 11.6 (H) 4.0 - 10.5 K/uL   RBC 3.78 (L) 3.87 - 5.11 MIL/uL   Hemoglobin 11.3 (L) 12.0 - 15.0 g/dL   HCT 16.1 (L) 09.6 - 04.5 %   MCV  90.5 78.0 - 100.0 fL   MCH 29.9 26.0 - 34.0 pg   MCHC 33.0 30.0 - 36.0 g/dL   RDW 52.8 41.3 - 24.4 %   Platelets 283 150 - 400 K/uL  Comprehensive metabolic panel     Status: Abnormal   Collection Time: 09/26/15  8:10 PM  Result Value Ref Range   Sodium 133 (L) 135 - 145 mmol/L   Potassium 4.3 3.5 - 5.1 mmol/L   Chloride 99 (L) 101 - 111 mmol/L   CO2 25 22 - 32 mmol/L   Glucose, Bld 332 (H) 65 - 99 mg/dL   BUN 12 6 - 20 mg/dL   Creatinine, Ser 0.10 0.44 - 1.00 mg/dL   Calcium 8.8 (L) 8.9 - 10.3 mg/dL   Total Protein 7.5 6.5 - 8.1 g/dL   Albumin 3.2 (L) 3.5 - 5.0 g/dL   AST 23 15 - 41 U/L   ALT 19 14 - 54 U/L   Alkaline Phosphatase 101 38 - 126 U/L   Total Bilirubin 0.3 0.3 - 1.2 mg/dL   GFR calc non Af Amer >60 >60 mL/min   GFR calc Af Amer >60 >60 mL/min   Anion gap 9 5 - 15  Lactate dehydrogenase     Status: None   Collection Time: 09/26/15  8:10 PM  Result Value Ref Range   LDH 161 98 - 192 U/L  Uric acid     Status: None   Collection Time: 09/26/15  8:10 PM  Result Value Ref Range   Uric Acid, Serum 5.2 2.3 - 6.6 mg/dL  Type and screen [facility]     Status: None   Collection Time: 09/26/15 10:00 PM  Result Value Ref Range   ABO/RH(D) B POS    Antibody Screen NEG    Sample Expiration 09/29/2015   ABO/Rh     Status: None   Collection Time: 09/26/15 10:00 PM  Result Value Ref Range   ABO/RH(D) B POS   Glucose, capillary     Status: Abnormal   Collection Time: 09/26/15 10:40 PM  Result Value Ref Range   Glucose-Capillary 187 (H) 65 - 99 mg/dL  MRSA PCR Screening     Status: None   Collection Time: 09/26/15 10:41 PM  Result Value Ref Range   MRSA by PCR NEGATIVE NEGATIVE  Glucose, capillary     Status: Abnormal   Collection Time: 09/27/15  2:44 AM  Result Value Ref Range   Glucose-Capillary 235 (H) 65 - 99 mg/dL  Glucose, capillary     Status: Abnormal   Collection Time: 09/27/15  6:47 AM  Result Value Ref Range   Glucose-Capillary 254 (H) 65 - 99 mg/dL  Glucose, capillary     Status: Abnormal   Collection Time: 09/27/15  8:59 AM  Result Value Ref Range   Glucose-Capillary 290 (H) 65 - 99 mg/dL    Meds: Scheduled Meds: . betamethasone acetate-betamethasone sodium phosphate  12 mg Intramuscular Once  . docusate sodium  100 mg Oral Daily  . insulin aspart  0-8 Units Subcutaneous TID AC  . insulin NPH Human  20 Units Subcutaneous QAC breakfast  . insulin NPH Human  24 Units Subcutaneous QHS  . prenatal multivitamin  1 tablet Oral Q1200   Continuous Infusions:  PRN Meds:.acetaminophen, calcium carbonate, hydrALAZINE, labetalol, zolpidem   Physical Exam  Constitutional: She is oriented to person, place, and time. She appears well-developed and well-nourished. No distress.  HENT:  Head: Normocephalic and atraumatic.  Eyes: EOM are normal.  Neck: Normal range of motion.  Cardiovascular: Normal rate and regular rhythm.   Pulmonary/Chest: Effort normal and breath sounds normal.  Abdominal: Soft. Bowel sounds are normal.  Musculoskeletal: Normal range of motion.  Neurological: She is alert and  oriented to person, place, and time.  Skin: Skin is warm and dry.  Vitals reviewed. :  BPP: 8/8 in 7min, AFI   Monitoring Type:Continuous Time:1108-1115 FHR: 135 bpm, Mod Var, -Decels, +Accels UC: None graphed  Assessment IUP at 4252w3d Cat I FT DM-A2 Mild PreEclampsia-Asymptomatic Desires discharge  Plan Informed of need for 2nd BMZ dosing and regulation of sugars Encouraged to anticipate 48-72 additional hours at minimal of inpatient care Consider SW consult Informed of BPP of 8/8--No q/c Continue current plan of care Upcoming Treatments/Tests: BMZ dosing this evening Dr.EK to follow as appropriate   Cherre RobinsJessica L Emly, MSN, CNM 09/27/2015, 11:10 AM    I saw and examined patient at bedside and agree with above findings assessment and plan.  Patient with pre-gestational diabetes on insulin and now with preeclampsia without severe features. Fingerstick glucoses elevated most likely secondary to recent corticosteroid use for fetal lung maturity.  Continue with current insulin regimen and consider adjusting if with persistent elevations after steroid use.   Patient desiring outpatient management. Will consult maternal-fetal medicine further recommendations on further management.  Dr. Sallye OberKulwa.
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
## 2015-09-27 NOTE — Progress Notes (Signed)
Inpatient Diabetes Program Recommendations  Diabetes Treatment Program Recommendations  ADA Standards of Care 2016 Diabetes in Pregnancy Target Glucose Ranges:  Fasting: 60 - 90 mg/dL Preprandial: 60 - 098105 mg/dL 1 hr postprandial: Less than 140mg /dL (from first bite of meal) 2 hr postprandial: Less than 120 mg/dL (from first bit of meal)   Review of Glycemic Control  Diabetes history: type 1 Outpatient Diabetes medications: NPH 20 units in am and 24 units at HS and Novolog "SSI" before meals 0-8 units. Current orders for Inpatient glycemic control: Same as OP regimen  Glucose running high due to betamethasone yesterday and today, results which typically last for the next 24-48 hrs following the last dose. Pt may benefit from an increase in NPH doses and/or an increase in the SSI novolog. I am glad to assist with recommendations for insulin dosing during this time if needed and/or requested. Otherwise, may want to consult Dr Talmage NapBalan with insulin dosing during this time.  Thank you Lenor CoffinAnn Karigan Cloninger, RN, MSN, CDE  Diabetes Inpatient Program Office: [phone] Pager: [phone] 8:00 am to 5:00 pm  Inpatient Diabetes Program Recommendations:

## 2015-09-27 NOTE — Consult Note (Signed)
MFM Note  [name] is a 35 year old G2P1 Caucasian female at 31+3 weeks who was admitted yesterday after she presented to MAU c/o pelvic pressure and decreased fetal movement. While being evaluated, her BPs were noted to be elevated 130s-140s/70s-90s with a PCR of 0.36. She denies a history of hypertension except during her last pregnancy. She was admitted for further work-up of PIH/preeclampsia and to receive a course of BMZ.   Her prenatal course has been complicated by type 1 DM which she has had for 12 years. She initially used a pump but for the past 6 years she's been giving herself shots. Her endocrinologist is Dr. Talmage NapBalan but she hasn't seen her for several months. An appt is set up for 01/17 or 01/18. Her insulin regimen consists of Humulin N 20 units q AM and 24 units q PM and Novolog prior to each meal with dosage being determined by a sliding scale pre-meal BS. She checks a 2 hour PP but does not give a correction. [name] states that a Hgb A1c last week in the office was 12. She reports that her diabetes is "brittle." The only end organ disease she knows of is some peripheral neuropathy with tingling in her feet but she hasn't had an eye exam in a while.   The most recent US on 01/05 showed the EFW at the 41st %tile with normal AFV. ECHO was performed by Dr. Elizebeth Brookingotton or will be in the near future.  G1: 2008; PIH in third trimester; spontaneous labor at 37 weeks; urgent C/S for NRFHTs under GA; NB in hospital for 11 days  Since admission: Labs: PCR - 0.36; UA neg for protein; normal LDH; Cr. 0.6; normal LFTs and platelets BPP 8/8 with normal AFV today BPs have ranged between 126-149/77-101 Course of BMZ: #2 due this evening Patient is asymptomatic (CNS, GI and pulm)  Assessment: 1) SIUP at 31+3 weeks 2) Type 1 DM under poor control based on recent A1c 3) Gestational hypertension vs preeclampsia (if urine prot > 300 mgs) without severe features 4) Previous C/S 5) Diabetic  neuropathy 6) Anxiety disorder on Lexapro 7) H/O Chiari malformation (?type 1) with repair in 2000 8) H/O PIH last pregnancy 419) Advanced maternal age; NT 1.4 mm; first trimester screen: neg? 10) S/P first dose of BMZ   Suggestions: After second dose of BMZ and BSs relatively stable, outpt management of preeclampsia without severe features would be reasonable; consider increasing Humulin N to 24 units in AM and 28 units in PM to bring BSs down under 200 Would have pt take BPs a few times daily at home Weekly or twice weekly visits for BPs  Weekly labs Weekly BPPs or twice weekly NSTs with weekly AFIs Would not treat BPs until 160/110; would then admit again to hospital for preE with severe features Encourage increased contact with Dr. Talmage NapBalan to help get BS in better control Needs eye exam Consider a 24 urine collection for total protein  Hopefully she will make it up to 37+0 weeks for a repeat C/S  Please call with any questions or concerns. [phone]  (Face-to-face consultation with patient: 45 min)

## 2015-09-28 LAB — GLUCOSE, CAPILLARY
GLUCOSE-CAPILLARY: 249 mg/dL — AB (ref 65–99)
Glucose-Capillary: 177 mg/dL — ABNORMAL HIGH (ref 65–99)
Glucose-Capillary: 184 mg/dL — ABNORMAL HIGH (ref 65–99)
Glucose-Capillary: 242 mg/dL — ABNORMAL HIGH (ref 65–99)
Glucose-Capillary: 248 mg/dL — ABNORMAL HIGH (ref 65–99)
Glucose-Capillary: 267 mg/dL — ABNORMAL HIGH (ref 65–99)
Glucose-Capillary: 299 mg/dL — ABNORMAL HIGH (ref 65–99)

## 2015-09-28 LAB — HEMOGLOBIN A1C
Hgb A1c MFr Bld: 11.6 % — ABNORMAL HIGH (ref 4.8–5.6)
Mean Plasma Glucose: 286 mg/dL

## 2015-09-28 MED ORDER — ESCITALOPRAM OXALATE 20 MG PO TABS
20.0000 mg | ORAL_TABLET | Freq: Every day | ORAL | Status: DC
  Administered 2015-09-28 – 2015-09-30 (×3): 20 mg via ORAL
  Filled 2015-09-28 (×4): qty 1

## 2015-09-28 MED ORDER — INSULIN ASPART 100 UNIT/ML ~~LOC~~ SOLN
10.0000 [IU] | Freq: Once | SUBCUTANEOUS | Status: AC
  Administered 2015-09-28: 10 [IU] via SUBCUTANEOUS

## 2015-09-28 MED ORDER — INSULIN ASPART 100 UNIT/ML ~~LOC~~ SOLN
0.0000 [IU] | SUBCUTANEOUS | Status: DC
  Administered 2015-09-29 (×2): 6 [IU] via SUBCUTANEOUS
  Administered 2015-09-30: 4 [IU] via SUBCUTANEOUS

## 2015-09-28 NOTE — Progress Notes (Signed)
Inpatient Diabetes Program Recommendations  Diabetes Treatment Program Recommendations  ADA Standards of Care 2016 Diabetes in Pregnancy Target Glucose Ranges:  Fasting: 60 - 90 mg/dL Preprandial: 60 - 478105 mg/dL 1 hr postprandial: Less than 140mg /dL (from first bite of meal) 2 hr postprandial: Less than 120 mg/dL (from first bit of meal)   Review of Glycemic Control Noted from history that patient has not seen Dr Talmage NapBalan for several months. Patient's glucose is not controlled. I am glad to assist with insulin adjustments and glucose control before discharge. Noted patient given last dose Betamethasone yesterday, thus the glucose is extremely elevated. HgbA1C is 11.6%. Please feel free to call if i can be of assistance.   Thank you Lenor CoffinAnn Tanai Bouler, RN, MSN, CDE   Diabetes Inpatient Program Office: [phone] Pager: [phone] 8:00 am to 5:00 pm

## 2015-09-28 NOTE — Progress Notes (Addendum)
Hospital day # 2 pregnancy at [redacted]w[redacted]d--PIH, PCR 0.36  S:  Perception of contractions: none      Vaginal bleeding: none now       Vaginal discharge:  no significant change      Denies pelvic pressure, HA, vb, lof w/+FM      Missing her daughter and desires to go home      Pt needs to know  O: BP 135/89 mmHg  Pulse 90  Temp(Src) 98.1 F (36.7 C) (Oral)  Resp 16  Ht 5\' 4"  (1.626 m)  Wt 156 lb (70.761 kg)  BMI 26.76 kg/m2  SpO2 100%  LMP 02/03/2015      Fetal tracings:130, moderate variability, + accel, no decels      Contractions:  none      Uterus gravid and non-tender      Extremities: no significant edema and no signs of DVT          Labs:   Results for orders placed or performed during the hospital encounter of 09/26/15 (from the past 24 hour(s))  Glucose, capillary     Status: Abnormal   Collection Time: 09/27/15 11:57 AM  Result Value Ref Range   Glucose-Capillary 254 (H) 65 - 99 mg/dL   Comment 1 Notify RN    Comment 2 Document in Chart   Glucose, capillary     Status: Abnormal   Collection Time: 09/27/15  4:04 PM  Result Value Ref Range   Glucose-Capillary 229 (H) 65 - 99 mg/dL   Comment 1 Notify RN    Comment 2 Document in Chart   Glucose, capillary     Status: Abnormal   Collection Time: 09/27/15  8:13 PM  Result Value Ref Range   Glucose-Capillary 208 (H) 65 - 99 mg/dL  Glucose, capillary     Status: Abnormal   Collection Time: 09/28/15 12:22 AM  Result Value Ref Range   Glucose-Capillary 299 (H) 65 - 99 mg/dL  Glucose, capillary     Status: Abnormal   Collection Time: 09/28/15  3:46 AM  Result Value Ref Range   Glucose-Capillary 242 (H) 65 - 99 mg/dL  Glucose, capillary     Status: Abnormal   Collection Time: 09/28/15  7:52 AM  Result Value Ref Range   Glucose-Capillary 248 (H) 65 - 99 mg/dL   Comment 1 Notify RN    Comment 2 Document in Chart          Meds:  Current facility-administered medications:  .  acetaminophen (TYLENOL) tablet 650 mg, 650  mg, Oral, Q4H PRN, Alphonzo Severance, CNM .  calcium carbonate (TUMS - dosed in mg elemental calcium) chewable tablet 400 mg of elemental calcium, 2 tablet, Oral, Q4H PRN, Alphonzo Severance, CNM .  docusate sodium (COLACE) capsule 100 mg, 100 mg, Oral, Daily, Alphonzo Severance, CNM, 100 mg at 09/26/15 2219 .  hydrALAZINE (APRESOLINE) injection 10 mg, 10 mg, Intravenous, Once PRN, Alphonzo Severance, CNM .  insulin aspart (novoLOG) injection 0-8 Units, 0-8 Units, Subcutaneous, TID AC, Alphonzo Severance, CNM, 5 Units at 09/28/15 0810 .  insulin NPH Human (HUMULIN N,NOVOLIN N) injection 24 Units, 24 Units, Subcutaneous, QAC breakfast, Gerrit Heck, CNM, 24 Units at 09/28/15 0810 .  insulin NPH Human (HUMULIN N,NOVOLIN N) injection 28 Units, 28 Units, Subcutaneous, QHS, Gerrit Heck, CNM, 28 Units at 09/27/15 2225 .  labetalol (NORMODYNE,TRANDATE) injection 20-80 mg, 20-80 mg, Intravenous, Q10 min PRN, Alphonzo Severance, CNM .  loperamide (IMODIUM) capsule 2-4 mg, 2-4 mg, Oral, PRN, Shanda Bumps  Emly, CNM, 4 mg at 09/27/15 1135 .  prenatal multivitamin tablet 1 tablet, 1 tablet, Oral, Q1200, Alphonzo Severanceachel Stall, CNM, 1 tablet at 09/27/15 1159 .  zolpidem (AMBIEN) tablet 5 mg, 5 mg, Oral, QHS PRN, Alphonzo Severanceachel Stall, CNM, 5 mg at 09/27/15 2236  A: 3528w4d with PIH, PCR 0.36     stable     Fetal tracings: appropriate for GA     Contractions: none     Uterus non-tender      Extremities: DTR 1+, no clonus, min edema     BMZ complete  P: Continue current plan of care      Consult with Dr. Normand Sloopillard  for plan on care      MDs will follow  Venus Standard, CNM, MSN 09/28/2015. 10:23 AM  Pt seen and examined.  Increased insulin and started ISS.   Plan for DC tomorrow

## 2015-09-29 LAB — GLUCOSE, CAPILLARY
GLUCOSE-CAPILLARY: 100 mg/dL — AB (ref 65–99)
Glucose-Capillary: 150 mg/dL — ABNORMAL HIGH (ref 65–99)
Glucose-Capillary: 148 mg/dL — ABNORMAL HIGH (ref 65–99)
Glucose-Capillary: 93 mg/dL (ref 65–99)
Glucose-Capillary: 161 mg/dL — ABNORMAL HIGH (ref 65–99)

## 2015-09-29 LAB — COMPREHENSIVE METABOLIC PANEL
ALBUMIN: 3 g/dL — AB (ref 3.5–5.0)
ALK PHOS: 81 U/L (ref 38–126)
ALT: 19 U/L (ref 14–54)
ANION GAP: 10 (ref 5–15)
AST: 21 U/L (ref 15–41)
BILIRUBIN TOTAL: 0.2 mg/dL — AB (ref 0.3–1.2)
BUN: 23 mg/dL — ABNORMAL HIGH (ref 6–20)
CALCIUM: 9 mg/dL (ref 8.9–10.3)
CO2: 25 mmol/L (ref 22–32)
Chloride: 99 mmol/L — ABNORMAL LOW (ref 101–111)
Creatinine, Ser: 0.58 mg/dL (ref 0.44–1.00)
GFR calc non Af Amer: >60 mL/min (ref >60)
GLUCOSE: 82 mg/dL (ref 65–99)
POTASSIUM: 4.1 mmol/L (ref 3.5–5.1)
Sodium: 134 mmol/L — ABNORMAL LOW (ref 135–145)
TOTAL PROTEIN: 6.7 g/dL (ref 6.5–8.1)

## 2015-09-29 LAB — CBC
HCT: 31 % — ABNORMAL LOW (ref 36.0–46.0)
HEMOGLOBIN: 10.4 g/dL — AB (ref 12.0–15.0)
MCH: 29.5 pg (ref 26.0–34.0)
MCHC: 33.5 g/dL (ref 30.0–36.0)
MCV: 88.1 fL (ref 78.0–100.0)
Platelets: 268 10*3/uL (ref 150–400)
RBC: 3.52 MIL/uL — AB (ref 3.87–5.11)
RDW: 14 % (ref 11.5–15.5)
WBC: 10.7 10*3/uL — ABNORMAL HIGH (ref 4.0–10.5)

## 2015-09-29 LAB — LACTATE DEHYDROGENASE: LDH: 140 U/L (ref 98–192)

## 2015-09-29 LAB — TYPE AND SCREEN
ABO/RH(D): B POS
Antibody Screen: NEGATIVE

## 2015-09-29 LAB — URIC ACID: URIC ACID, SERUM: 4.8 mg/dL (ref 2.3–6.6)

## 2015-09-29 NOTE — Progress Notes (Signed)
Initial Nutrition Assessment  DOCUMENTATION CODES:   Not applicable  INTERVENTION:  Carbohydrate modified gestational diet, double protein portions  NUTRITION DIAGNOSIS:  Increased nutrient needs r/t pregnancy and fetal growth requirements aeb 31 weeks IUP  GOAL:  Patient will meet greater than or equal to 90% of their needs   MONITOR:  Weight trends, Labs  REASON FOR ASSESSMENT:  Gestational Diabetes, Antenatal   ASSESSMENT:  31 5/7 weeks, IDGDM pre-eclampsia. Reports total weight gain of 5 lbs. Weight gain goals 15-25 lbs. Reports good appetite  Diet Order:  Diet gestational carb mod Room service appropriate?: Yes; Fluid consistency:: Thin  Skin:  Reviewed, no issues Height:   Ht Readings from Last 1 Encounters:  09/26/15 5\' 4"  (1.626 m)   Weight:  Pre-pregnancy weight 151 lbs Wt Readings from Last 1 Encounters:  09/29/15 156 lb (70.761 kg)   Ideal Body Weight:  54.5 kg  BMI:  Body mass index is 26.76 kg/(m^2). pre-pregnancy BMI 25.8  Estimated Nutritional Needs:   Kcal:  1800-2000  Protein:  80-90 g  Fluid:  2.1L  EDUCATION NEEDS:   No education needs identified at this time Received GDM educ  06/15/15. Reviewed CHO limits with pt. Pt had no questions  Elisabeth CaraKatherine Reynol Arnone M.Odis LusterEd. R.D. LDN Neonatal Nutrition Support Specialist/RD III Pager [phone]      Phone [phone]
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
## 2015-09-30 ENCOUNTER — Inpatient Hospital Stay (HOSPITAL_COMMUNITY): Payer: Medicaid Other

## 2015-09-30 DIAGNOSIS — O34219 Maternal care for unspecified type scar from previous cesarean delivery: Secondary | ICD-10-CM | POA: Diagnosis present

## 2015-09-30 LAB — GLUCOSE, CAPILLARY
GLUCOSE-CAPILLARY: 128 mg/dL — AB (ref 65–99)
Glucose-Capillary: 97 mg/dL (ref 65–99)
Glucose-Capillary: 106 mg/dL — ABNORMAL HIGH (ref 65–99)

## 2015-09-30 LAB — PROTEIN, URINE, 24 HOUR
COLLECTION INTERVAL-UPROT: 24 h
PROTEIN, 24H URINE: 443 mg/d — AB (ref 50–100)
PROTEIN, URINE: 15 mg/dL
URINE TOTAL VOLUME-UPROT: 2950 mL

## 2015-09-30 MED ORDER — BUTALBITAL-APAP-CAFFEINE 50-325-40 MG PO TABS
2.0000 | ORAL_TABLET | Freq: Four times a day (QID) | ORAL | Status: DC | PRN
  Administered 2015-09-30 (×2): 2 via ORAL
  Filled 2015-09-30 (×2): qty 2

## 2015-09-30 MED ORDER — SODIUM CHLORIDE 0.9 % IJ SOLN
3.0000 mL | Freq: Two times a day (BID) | INTRAMUSCULAR | Status: DC
  Administered 2015-09-30: 3 mL via INTRAVENOUS

## 2015-09-30 MED ORDER — SODIUM CHLORIDE 0.9 % IJ SOLN: 3.0000 mL | INTRAMUSCULAR | Status: DC | PRN

## 2015-09-30 MED ORDER — BUTALBITAL-APAP-CAFFEINE 50-325-40 MG PO TABS: 1.0000 | ORAL_TABLET | Freq: Four times a day (QID) | ORAL | Status: DC | PRN

## 2015-09-30 NOTE — Progress Notes (Addendum)
Antepartum LOS: 4 [name], 35 y.o.,   OB History    Gravida Para Term Preterm AB TAB SAB Ectopic Multiple Living   2 1 1       1       Subjective -Patient resting in bed.  Reports headache and no relief from tylenol; last dose given around 0500.  Patient also reports some nausea earlier today that resolved spontaneously.  Patient endorses good fetal movement and denies LOF, VB, and contractions.  Patient further denies visual disturbances, SOB, epigastric pain, and numbness/tingling of extremities.   Objective  Filed Vitals:   09/30/15 0513 09/30/15 0803 09/30/15 0911 09/30/15 0913  BP: 137/88 154/94 129/89   Pulse: 74 81 91 96  Temp:  98.1 F (36.7 C)    TempSrc:  Oral    Resp:  18    Height:      Weight:      SpO2:    100%    Results for orders placed or performed during the hospital encounter of 09/26/15 (from the past 24 hour(s))  Glucose, capillary     Status: Abnormal   Collection Time: 09/29/15 11:04 AM  Result Value Ref Range   Glucose-Capillary 148 (H) 65 - 99 mg/dL   Comment 1 Notify RN    Comment 2 Document in Chart   Glucose, capillary     Status: None   Collection Time: 09/29/15  2:03 PM  Result Value Ref Range   Glucose-Capillary 93 65 - 99 mg/dL   Comment 1 Notify RN    Comment 2 Document in Chart   Type and screen [facility]     Status: None   Collection Time: 09/29/15  3:15 PM  Result Value Ref Range   ABO/RH(D) B POS    Antibody Screen NEG    Sample Expiration 10/02/2015   CBC     Status: Abnormal   Collection Time: 09/29/15  3:15 PM  Result Value Ref Range   WBC 10.7 (H) 4.0 - 10.5 K/uL   RBC 3.52 (L) 3.87 - 5.11 MIL/uL   Hemoglobin 10.4 (L) 12.0 - 15.0 g/dL   HCT 16.1 (L) 09.6 - 04.5 %   MCV 88.1 78.0 - 100.0 fL   MCH 29.5 26.0 - 34.0 pg   MCHC 33.5 30.0 - 36.0 g/dL   RDW 40.9 81.1 - 91.4 %   Platelets 268 150 - 400 K/uL  Comprehensive metabolic panel     Status: Abnormal   Collection Time: 09/29/15  3:15 PM   Result Value Ref Range   Sodium 134 (L) 135 - 145 mmol/L   Potassium 4.1 3.5 - 5.1 mmol/L   Chloride 99 (L) 101 - 111 mmol/L   CO2 25 22 - 32 mmol/L   Glucose, Bld 82 65 - 99 mg/dL   BUN 23 (H) 6 - 20 mg/dL   Creatinine, Ser 7.82 0.44 - 1.00 mg/dL   Calcium 9.0 8.9 - 95.6 mg/dL   Total Protein 6.7 6.5 - 8.1 g/dL   Albumin 3.0 (L) 3.5 - 5.0 g/dL   AST 21 15 - 41 U/L   ALT 19 14 - 54 U/L   Alkaline Phosphatase 81 38 - 126 U/L   Total Bilirubin 0.2 (L) 0.3 - 1.2 mg/dL   GFR calc non Af Amer >60 >60 mL/min   GFR calc Af Amer >60 >60 mL/min   Anion gap 10 5 - 15  Lactate dehydrogenase     Status: None   Collection Time:  09/29/15  3:15 PM  Result Value Ref Range   LDH 140 98 - 192 U/L  Uric acid     Status: None   Collection Time: 09/29/15  3:15 PM  Result Value Ref Range   Uric Acid, Serum 4.8 2.3 - 6.6 mg/dL  Glucose, capillary     Status: Abnormal   Collection Time: 09/29/15  7:07 PM  Result Value Ref Range   Glucose-Capillary 100 (H) 65 - 99 mg/dL   Comment 1 Notify RN    Comment 2 Document in Chart   Glucose, capillary     Status: Abnormal   Collection Time: 09/29/15 11:10 PM  Result Value Ref Range   Glucose-Capillary 161 (H) 65 - 99 mg/dL  Glucose, capillary     Status: None   Collection Time: 09/30/15  7:48 AM  Result Value Ref Range   Glucose-Capillary 97 65 - 99 mg/dL   Comment 1 Notify RN    Comment 2 Document in Chart     Meds: Scheduled Meds: . docusate sodium  100 mg Oral Daily  . escitalopram  20 mg Oral Daily  . insulin aspart  0-8 Units Subcutaneous 4 times per day  . insulin NPH Human  24 Units Subcutaneous QAC breakfast  . insulin NPH Human  28 Units Subcutaneous QHS  . prenatal multivitamin  1 tablet Oral Q1200   Continuous Infusions:  PRN Meds:.acetaminophen, calcium carbonate, hydrALAZINE, labetalol, loperamide, zolpidem   Physical Exam  Constitutional: She is oriented to person, place, and time. She appears well-developed and well-nourished.  No distress.  HENT:  Head: Normocephalic and atraumatic.  Eyes: Conjunctivae are normal.  Neck: Normal range of motion.  Cardiovascular: Normal rate, regular rhythm and normal heart sounds.   Pulmonary/Chest: Effort normal and breath sounds normal.  Abdominal: Soft. Bowel sounds are normal.  Musculoskeletal: Normal range of motion.  Neurological: She is alert and oriented to person, place, and time.  Skin: Skin is warm and dry.  Psychiatric: Her mood appears anxious.  Vitals reviewed. :   Monitoring Type:  Time:0930-1000 FHR: 140 bpm, Mod Var, -Decels, -Accels UC: None graphed  BPP: 8/8 AFI: Cephalic, 15.54cm (55%ile), 1455gram (3lbs 3oz) (20th%ile) with most biometric measurements <5%tile  **Felt patient is stable enough for discharge, recommend outpatient antenatal testing--2x wkly NST with weekly AFI or BPPs.  Repeat growth US in 3 weeks.   Assessment IUP at 6055w6d Cat I FT DM-Type 1 PreEclampsia Headache  Plan Will start fiorcet 2 tablets now, then q6 hrs  Patient instructed to inform nurse if no relief from fiorcet Continue current plan of care Upcoming Treatments/Tests: 24 hr Urine due at 1545, May repeat labs after 24 hr urine is complete considering results Dr.SR to follow as appropriate   Cherre RobinsJessica L Dekayla Prestridge, MSN, CNM 09/30/2015, 9:26 AM    Addendum Dr. Lynford HumphreySR advised okay to discharge patient upon completion of 24 hour urine Patient outpatient mgmt as indicated: Twice weekly office visits including NSTs and AFI or BPP Keep scheduled appt with Dr. Talmage NapBalan for 10/05/2015 Start to keep daily log of BS Schedule Repeat CS for 37 wks  Cherre RobinsJessica L Selinda Korzeniewski MSN, CNM 09/30/2015 1:21 PM

## 2015-09-30 NOTE — Discharge Summary (Signed)
Physician Discharge Summary  Patient ID: [name] MRN: [id number] DOB/AGE: Jan 22, 1981 35 y.o.  Admit date: 09/26/2015 Discharge date: 09/30/2015  Admission Diagnoses:  Discharge Diagnoses:  Principal Problem:   DM (diabetes mellitus), type 1 ([place]) Active Problems:   Preeclampsia   History of cesarean delivery, currently pregnant   Discharged Condition: stable  Hospital Course: IV hydration, MFM consult, Diabetic Education, Labs  Consults: As above  Significant Diagnostic Studies: labs:  Results for orders placed or performed during the hospital encounter of 09/26/15 (from the past 72 hour(s))  Glucose, capillary     Status: Abnormal   Collection Time: 09/27/15  4:04 PM  Result Value Ref Range   Glucose-Capillary 229 (H) 65 - 99 mg/dL   Comment 1 Notify RN    Comment 2 Document in Chart   Glucose, capillary     Status: Abnormal   Collection Time: 09/27/15  8:13 PM  Result Value Ref Range   Glucose-Capillary 208 (H) 65 - 99 mg/dL  Glucose, capillary     Status: Abnormal   Collection Time: 09/28/15 12:22 AM  Result Value Ref Range   Glucose-Capillary 299 (H) 65 - 99 mg/dL  Glucose, capillary     Status: Abnormal   Collection Time: 09/28/15  3:46 AM  Result Value Ref Range   Glucose-Capillary 242 (H) 65 - 99 mg/dL  Glucose, capillary     Status: Abnormal   Collection Time: 09/28/15  7:52 AM  Result Value Ref Range   Glucose-Capillary 248 (H) 65 - 99 mg/dL   Comment 1 Notify RN    Comment 2 Document in Chart   Glucose, capillary     Status: Abnormal   Collection Time: 09/28/15 11:57 AM  Result Value Ref Range   Glucose-Capillary 267 (H) 65 - 99 mg/dL   Comment 1 Notify RN    Comment 2 Document in Chart   Glucose, capillary     Status: Abnormal   Collection Time: 09/28/15  4:08 PM  Result Value Ref Range   Glucose-Capillary 249 (H) 65 - 99 mg/dL   Comment 1 Notify RN    Comment 2 Document in Chart   Glucose, capillary     Status: Abnormal   Collection Time:  09/28/15  6:05 PM  Result Value Ref Range   Glucose-Capillary 177 (H) 65 - 99 mg/dL   Comment 1 Notify RN    Comment 2 Document in Chart   Glucose, capillary     Status: Abnormal   Collection Time: 09/28/15  8:02 PM  Result Value Ref Range   Glucose-Capillary 184 (H) 65 - 99 mg/dL  Glucose, capillary     Status: Abnormal   Collection Time: 09/29/15  7:26 AM  Result Value Ref Range   Glucose-Capillary 150 (H) 65 - 99 mg/dL  Glucose, capillary     Status: Abnormal   Collection Time: 09/29/15 11:04 AM  Result Value Ref Range   Glucose-Capillary 148 (H) 65 - 99 mg/dL   Comment 1 Notify RN    Comment 2 Document in Chart   Glucose, capillary     Status: None   Collection Time: 09/29/15  2:03 PM  Result Value Ref Range   Glucose-Capillary 93 65 - 99 mg/dL   Comment 1 Notify RN    Comment 2 Document in Chart   Type and screen [place]     Status: None   Collection Time: 09/29/15  3:15 PM  Result Value Ref Range   ABO/RH(D) B POS  Antibody Screen NEG    Sample Expiration 10/02/2015   CBC     Status: Abnormal   Collection Time: 09/29/15  3:15 PM  Result Value Ref Range   WBC 10.7 (H) 4.0 - 10.5 K/uL   RBC 3.52 (L) 3.87 - 5.11 MIL/uL   Hemoglobin 10.4 (L) 12.0 - 15.0 g/dL   HCT 34.3 (L) 88.1 - 79.1 %   MCV 88.1 78.0 - 100.0 fL   MCH 29.5 26.0 - 34.0 pg   MCHC 33.5 30.0 - 36.0 g/dL   RDW 05.8 61.0 - 04.2 %   Platelets 268 150 - 400 K/uL  Comprehensive metabolic panel     Status: Abnormal   Collection Time: 09/29/15  3:15 PM  Result Value Ref Range   Sodium 134 (L) 135 - 145 mmol/L   Potassium 4.1 3.5 - 5.1 mmol/L   Chloride 99 (L) 101 - 111 mmol/L   CO2 25 22 - 32 mmol/L   Glucose, Bld 82 65 - 99 mg/dL   BUN 23 (H) 6 - 20 mg/dL   Creatinine, Ser 9.06 0.44 - 1.00 mg/dL   Calcium 9.0 8.9 - 99.1 mg/dL   Total Protein 6.7 6.5 - 8.1 g/dL   Albumin 3.0 (L) 3.5 - 5.0 g/dL   AST 21 15 - 41 U/L   ALT 19 14 - 54 U/L   Alkaline Phosphatase 81 38 - 126 U/L    Total Bilirubin 0.2 (L) 0.3 - 1.2 mg/dL   GFR calc non Af Amer >60 >60 mL/min   GFR calc Af Amer >60 >60 mL/min    Comment: (NOTE) The eGFR has been calculated using the CKD EPI equation. This calculation has not been validated in all clinical situations. eGFR's persistently <60 mL/min signify possible Chronic Kidney Disease.    Anion gap 10 5 - 15  Lactate dehydrogenase     Status: None   Collection Time: 09/29/15  3:15 PM  Result Value Ref Range   LDH 140 98 - 192 U/L  Uric acid     Status: None   Collection Time: 09/29/15  3:15 PM  Result Value Ref Range   Uric Acid, Serum 4.8 2.3 - 6.6 mg/dL  Glucose, capillary     Status: Abnormal   Collection Time: 09/29/15  7:07 PM  Result Value Ref Range   Glucose-Capillary 100 (H) 65 - 99 mg/dL   Comment 1 Notify RN    Comment 2 Document in Chart   Glucose, capillary     Status: Abnormal   Collection Time: 09/29/15 11:10 PM  Result Value Ref Range   Glucose-Capillary 161 (H) 65 - 99 mg/dL  Glucose, capillary     Status: None   Collection Time: 09/30/15  7:48 AM  Result Value Ref Range   Glucose-Capillary 97 65 - 99 mg/dL   Comment 1 Notify RN    Comment 2 Document in Chart   Glucose, capillary     Status: Abnormal   Collection Time: 09/30/15 10:25 AM  Result Value Ref Range   Glucose-Capillary 106 (H) 65 - 99 mg/dL   Comment 1 Notify RN    Comment 2 Document in Chart     Treatments: IV hydration and insulin: regular and NPH  Discharge Exam: Blood pressure 142/94, pulse 82, temperature 98.1 F (36.7 C), temperature source Oral, resp. rate 18, height 5\' 4"  (1.626 m), weight 70.761 kg (156 lb), last menstrual period 02/03/2015, SpO2 100 %. General appearance: alert, cooperative and no distress Resp: clear to  auscultation bilaterally Cardio: regular rate and rhythm GI: normal findings: bowel sounds normal Extremities: extremities normal, atraumatic, no cyanosis or edema Skin: Skin color, texture, turgor normal. No rashes or  lesions  Disposition: 01-Home or Self Care  Discharge Instructions    Discharge instructions    Complete by:  As directed   Increase NPH to 24 U in AM and 28 U in PM. Start to keep daily log of BS.  Twice Weekly Office visits to include one MD visit with Dr. Chauncey Cruel. [place], NST, and AFI or BPP.  Keep appt with Dr. Chalmers Cater on 1/17.  Light duty at home.  Maintain diabetic appropriate diet.  Call if you have s/s of worsening preeclampsia.            Medication List    STOP taking these medications        acetaminophen 325 MG tablet  Commonly known as:  TYLENOL     cephALEXin 500 MG capsule  Commonly known as:  KEFLEX     silver sulfADIAZINE 1 % cream  Commonly known as:  SILVADENE      TAKE these medications        butalbital-acetaminophen-caffeine 50-325-40 MG tablet  Commonly known as:  FIORICET, ESGIC  Take 1-2 tablets by mouth every 6 (six) hours as needed for headache.     cetirizine 10 MG tablet  Commonly known as:  ZYRTEC  Take 10 mg by mouth daily.     escitalopram 20 MG tablet  Commonly known as:  LEXAPRO  Take 20 mg by mouth daily.     ferrous sulfate 325 (65 FE) MG tablet  Take 325 mg by mouth daily with breakfast.     insulin aspart 100 UNIT/ML injection  Commonly known as:  novoLOG  Inject 0-20 Units into the skin 3 (three) times daily before meals. Less than 80=0 units, 80-120=10 units, 121-175=12 units, 176-200=14 units, 201-250=16 units, 251-300=18 units, 301 or greater=20 units     insulin NPH Human 100 UNIT/ML injection  Commonly known as:  HUMULIN N,NOVOLIN N  Inject 20-24 Units into the skin 2 (two) times daily before a meal. Take 20 units in the AM and 24 u in the PM     prenatal multivitamin Tabs tablet  Take 1 tablet by mouth daily at 12 noon.           Follow-up Information    Schedule an appointment as soon as possible for a visit with [facility].   Specialty:  Obstetrics and Gynecology   Why:  Please call if  you have any questions or concerns, prior to your next visit.   Contact information:   [address] [place] [address] [phone]      Signed: Maryann Conners MSN, CNM 09/30/2015, 1:43 PM

## 2015-09-30 NOTE — Discharge Instructions (Signed)

## 2015-10-03 ENCOUNTER — Inpatient Hospital Stay (HOSPITAL_COMMUNITY)
Admission: AD | Admit: 2015-10-03 | Discharge: 2015-10-03 | Disposition: A | Discharge status: Home / Self Care | Payer: Medicaid Other | Source: Ambulatory Visit | Attending: Obstetrics & Gynecology | Admitting: Obstetrics & Gynecology

## 2015-10-03 ENCOUNTER — Encounter (HOSPITAL_COMMUNITY): Payer: Self-pay | Admitting: *Deleted

## 2015-10-03 DIAGNOSIS — E1040 Type 1 diabetes mellitus with diabetic neuropathy, unspecified: Secondary | ICD-10-CM | POA: Diagnosis not present

## 2015-10-03 DIAGNOSIS — E1065 Type 1 diabetes mellitus with hyperglycemia: Secondary | ICD-10-CM | POA: Insufficient documentation

## 2015-10-03 DIAGNOSIS — F329 Major depressive disorder, single episode, unspecified: Secondary | ICD-10-CM | POA: Diagnosis not present

## 2015-10-03 DIAGNOSIS — Z794 Long term (current) use of insulin: Secondary | ICD-10-CM | POA: Insufficient documentation

## 2015-10-03 DIAGNOSIS — Z79899 Other long term (current) drug therapy: Secondary | ICD-10-CM | POA: Diagnosis not present

## 2015-10-03 DIAGNOSIS — Z3A32 32 weeks gestation of pregnancy: Secondary | ICD-10-CM | POA: Diagnosis not present

## 2015-10-03 DIAGNOSIS — O99343 Other mental disorders complicating pregnancy, third trimester: Secondary | ICD-10-CM | POA: Diagnosis not present

## 2015-10-03 DIAGNOSIS — O1493 Unspecified pre-eclampsia, third trimester: Secondary | ICD-10-CM | POA: Insufficient documentation

## 2015-10-03 DIAGNOSIS — O24013 Pre-existing diabetes mellitus, type 1, in pregnancy, third trimester: Secondary | ICD-10-CM | POA: Diagnosis not present

## 2015-10-03 LAB — COMPREHENSIVE METABOLIC PANEL
ALBUMIN: 3.1 g/dL — AB (ref 3.5–5.0)
ALT: 20 U/L (ref 14–54)
AST: 21 U/L (ref 15–41)
Alkaline Phosphatase: 87 U/L (ref 38–126)
Anion gap: 8 (ref 5–15)
BUN: 12 mg/dL (ref 6–20)
CHLORIDE: 101 mmol/L (ref 101–111)
CO2: 24 mmol/L (ref 22–32)
Calcium: 8.3 mg/dL — ABNORMAL LOW (ref 8.9–10.3)
Creatinine, Ser: 0.44 mg/dL (ref 0.44–1.00)
GFR calc Af Amer: >60 mL/min (ref >60)
GFR calc non Af Amer: >60 mL/min (ref >60)
GLUCOSE: 269 mg/dL — AB (ref 65–99)
POTASSIUM: 4 mmol/L (ref 3.5–5.1)
SODIUM: 133 mmol/L — AB (ref 135–145)
Total Bilirubin: 0.3 mg/dL (ref 0.3–1.2)
Total Protein: 6.7 g/dL (ref 6.5–8.1)

## 2015-10-03 LAB — CBC
HEMATOCRIT: 31.9 % — AB (ref 36.0–46.0)
Hemoglobin: 10.6 g/dL — ABNORMAL LOW (ref 12.0–15.0)
MCH: 29.4 pg (ref 26.0–34.0)
MCHC: 33.2 g/dL (ref 30.0–36.0)
MCV: 88.6 fL (ref 78.0–100.0)
PLATELETS: 258 10*3/uL (ref 150–400)
RBC: 3.6 MIL/uL — ABNORMAL LOW (ref 3.87–5.11)
RDW: 14 % (ref 11.5–15.5)
WBC: 7.8 10*3/uL (ref 4.0–10.5)

## 2015-10-03 LAB — URINE MICROSCOPIC-ADD ON

## 2015-10-03 LAB — URINALYSIS, ROUTINE W REFLEX MICROSCOPIC
Bilirubin Urine: NEGATIVE
Glucose, UA: >1000 mg/dL — AB
Hgb urine dipstick: NEGATIVE
KETONES UR: NEGATIVE mg/dL
LEUKOCYTES UA: NEGATIVE
NITRITE: NEGATIVE
PROTEIN: NEGATIVE mg/dL
Specific Gravity, Urine: 1.02 (ref 1.005–1.030)
pH: 6 (ref 5.0–8.0)

## 2015-10-03 LAB — GLUCOSE, CAPILLARY
GLUCOSE-CAPILLARY: 237 mg/dL — AB (ref 65–99)
GLUCOSE-CAPILLARY: 122 mg/dL — AB (ref 65–99)
GLUCOSE-CAPILLARY: 126 mg/dL — AB (ref 65–99)

## 2015-10-03 LAB — PROTEIN / CREATININE RATIO, URINE
Creatinine, Urine: 64 mg/dL
PROTEIN CREATININE RATIO: 0.63 mg/mg{creat} — AB (ref 0.00–0.15)
Total Protein, Urine: 40 mg/dL

## 2015-10-03 LAB — URIC ACID: URIC ACID, SERUM: 4.8 mg/dL (ref 2.3–6.6)

## 2015-10-03 LAB — LACTATE DEHYDROGENASE: LDH: 136 U/L (ref 98–192)

## 2015-10-03 MED ORDER — ACETAMINOPHEN 325 MG PO TABS
650.0000 mg | ORAL_TABLET | Freq: Four times a day (QID) | ORAL | Status: DC | PRN
  Administered 2015-10-03: 650 mg via ORAL
  Filled 2015-10-03: qty 2
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
## 2015-10-03 NOTE — Progress Notes (Signed)
Alphonzo Severanceachel Stall, CNM notified of pt arrival to MAU.  States she will come see pt after we get lab results.

## 2015-10-03 NOTE — MAU Note (Signed)
Pt was diagnosed with pre-eclampsia last Sunday and was admitted until Thursday night.  This morning her pressure was 160/110.  Pt states her blood sugars have also been up.  Pt states she is feeling the baby move but it is less than normal.

## 2015-10-03 NOTE — Progress Notes (Signed)
Alphonzo Severanceachel Stall, CNM notified of pt blood sugar of 237.  States she will come see the pt and wait for other lab results.

## 2015-10-03 NOTE — Discharge Instructions (Signed)
Preeclampsia and Eclampsia °Preeclampsia is a serious condition that develops only during pregnancy. It is also called toxemia of pregnancy. This condition causes high blood pressure along with other symptoms, such as swelling and headaches. These may develop as the condition gets worse. Preeclampsia may occur 20 weeks or later into your pregnancy.  °Diagnosing and treating preeclampsia early is very important. If not treated early, it can cause serious problems for you and your baby. One problem it can lead to is eclampsia, which is a condition that causes muscle jerking or shaking (convulsions) in the mother. Delivering your baby is the best treatment for preeclampsia or eclampsia.  °RISK FACTORS °The cause of preeclampsia is not known. You may be more likely to develop preeclampsia if you have certain risk factors. These include:  °· Being pregnant for the first time. °· Having preeclampsia in a past pregnancy. °· Having a family history of preeclampsia. °· Having high blood pressure. °· Being pregnant with twins or triplets. °· Being 35 or older. °· Being African American. °· Having kidney disease or diabetes. °· Having medical conditions such as lupus or blood diseases. °· Being very overweight (obese). °SIGNS AND SYMPTOMS  °The earliest signs of preeclampsia are: °· High blood pressure. °· Increased protein in your urine. Your health care provider will check for this at every prenatal visit. °Other symptoms that can develop include:  °· Severe headaches. °· Sudden weight gain. °· Swelling of your hands, face, legs, and feet. °· Feeling sick to your stomach (nauseous) and throwing up (vomiting). °· Vision problems (blurred or double vision). °· Numbness in your face, arms, legs, and feet. °· Dizziness. °· Slurred speech. °· Sensitivity to bright lights. °· Abdominal pain. °DIAGNOSIS  °There are no screening tests for preeclampsia. Your health care provider will ask you about symptoms and check for signs of  preeclampsia during your prenatal visits. You may also have tests, including: °· Urine testing. °· Blood testing. °· Checking your baby's heart rate. °· Checking the health of your baby and your placenta using images created with sound waves (ultrasound). °TREATMENT  °You can work out the best treatment approach together with your health care provider. It is very important to keep all prenatal appointments. If you have an increased risk of preeclampsia, you may need more frequent prenatal exams. °· Your health care provider may prescribe bed rest. °· You may have to eat as little salt as possible. °· You may need to take medicine to lower your blood pressure if the condition does not respond to more conservative measures. °· You may need to stay in the hospital if your condition is severe. There, treatment will focus on controlling your blood pressure and fluid retention. You may also need to take medicine to prevent seizures. °· If the condition gets worse, your baby may need to be delivered early to protect you and the baby. You may have your labor started with medicine (be induced), or you may have a cesarean delivery. °· Preeclampsia usually goes away after the baby is born. °HOME CARE INSTRUCTIONS  °· Only take over-the-counter or prescription medicines as directed by your health care provider. °· Lie on your left side while resting. This keeps pressure off your baby. °· Elevate your feet while resting. °· Get regular exercise. Ask your health care provider what type of exercise is safe for you. °· Avoid caffeine and alcohol. °· Do not smoke. °· Drink 6-8 glasses of water every day. °· Eat a balanced diet   that is low in salt. Do not add salt to your food.  Avoid stressful situations as much as possible.  Get plenty of rest and sleep.  Keep all prenatal appointments and tests as scheduled. SEEK MEDICAL CARE IF:  You are gaining more weight than expected.  You have any headaches, abdominal pain, or  nausea.  You are bruising more than usual.  You feel dizzy or light-headed. SEEK IMMEDIATE MEDICAL CARE IF:   You develop sudden or severe swelling anywhere in your body. This usually happens in the legs.  You gain 5 lb (2.3 kg) or more in a week.  You have a severe headache, dizziness, problems with your vision, or confusion.  You have severe abdominal pain.  You have lasting nausea or vomiting.  You have a seizure.  You have trouble moving any part of your body.  You develop numbness in your body.  You have trouble speaking.  You have any abnormal bleeding.  You develop a stiff neck.  You pass out. MAKE SURE YOU:   Understand these instructions.  Will watch your condition.  Will get help right away if you are not doing well or get worse.   This information is not intended to replace advice given to you by your health care provider. Make sure you discuss any questions you have with your health care provider.   Document Released: 09/01/2000 Document Revised: 09/09/2013 Document Reviewed: 06/27/2013 Elsevier Interactive Patient Education 2016 [employer].  Diabetes Mellitus and Food It is important for you to manage your blood sugar (glucose) level. Your blood glucose level can be greatly affected by what you eat. Eating healthier foods in the appropriate amounts throughout the day at about the same time each day will help you control your blood glucose level. It can also help slow or prevent worsening of your diabetes mellitus. Healthy eating may even help you improve the level of your blood pressure and reach or maintain a healthy weight.  General recommendations for healthful eating and cooking habits include:  Eating meals and snacks regularly. Avoid going long periods of time without eating to lose weight.  Eating a diet that consists mainly of plant-based foods, such as fruits, vegetables, nuts, legumes, and whole grains.  Using low-heat cooking methods, such  as baking, instead of high-heat cooking methods, such as deep frying. Work with your dietitian to make sure you understand how to use the Nutrition Facts information on food labels. HOW CAN FOOD AFFECT ME? Carbohydrates Carbohydrates affect your blood glucose level more than any other type of food. Your dietitian will help you determine how many carbohydrates to eat at each meal and teach you how to count carbohydrates. Counting carbohydrates is important to keep your blood glucose at a healthy level, especially if you are using insulin or taking certain medicines for diabetes mellitus. Alcohol Alcohol can cause sudden decreases in blood glucose (hypoglycemia), especially if you use insulin or take certain medicines for diabetes mellitus. Hypoglycemia can be a life-threatening condition. Symptoms of hypoglycemia (sleepiness, dizziness, and disorientation) are similar to symptoms of having too much alcohol.  If your health care provider has given you approval to drink alcohol, do so in moderation and use the following guidelines:  Women should not have more than one drink per day, and men should not have more than two drinks per day. One drink is equal to:  12 oz of beer.  5 oz of wine.  1 oz of hard liquor.  Do not drink  on an empty stomach.  Keep yourself hydrated. Have water, diet soda, or unsweetened iced tea.  Regular soda, juice, and other mixers might contain a lot of carbohydrates and should be counted. WHAT FOODS ARE NOT RECOMMENDED? As you make food choices, it is important to remember that all foods are not the same. Some foods have fewer nutrients per serving than other foods, even though they might have the same number of calories or carbohydrates. It is difficult to get your body what it needs when you eat foods with fewer nutrients. Examples of foods that you should avoid that are high in calories and carbohydrates but low in nutrients include:  Trans fats (most processed  foods list trans fats on the Nutrition Facts label).  Regular soda.  Juice.  Candy.  Sweets, such as cake, pie, doughnuts, and cookies.  Fried foods. WHAT FOODS CAN I EAT? Eat nutrient-rich foods, which will nourish your body and keep you healthy. The food you should eat also will depend on several factors, including:  The calories you need.  The medicines you take.  Your weight.  Your blood glucose level.  Your blood pressure level.  Your cholesterol level. You should eat a variety of foods, including:  Protein.  Lean cuts of meat.  Proteins low in saturated fats, such as fish, egg whites, and beans. Avoid processed meats.  Fruits and vegetables.  Fruits and vegetables that may help control blood glucose levels, such as apples, mangoes, and yams.  Dairy products.  Choose fat-free or low-fat dairy products, such as milk, yogurt, and cheese.  Grains, bread, pasta, and rice.  Choose whole grain products, such as multigrain bread, whole oats, and brown rice. These foods may help control blood pressure.  Fats.  Foods containing healthful fats, such as nuts, avocado, olive oil, canola oil, and fish. DOES EVERYONE WITH DIABETES MELLITUS HAVE THE SAME MEAL PLAN? Because every person with diabetes mellitus is different, there is not one meal plan that works for everyone. It is very important that you meet with a dietitian who will help you create a meal plan that is just right for you.   This information is not intended to replace advice given to you by your health care provider. Make sure you discuss any questions you have with your health care provider.   Document Released: 06/01/2005 Document Revised: 09/25/2014 Document Reviewed: 08/01/2013 Elsevier Interactive Patient Education [employer]2016 Elsevier Inc.

## 2015-10-05 ENCOUNTER — Other Ambulatory Visit: Payer: Self-pay | Admitting: Obstetrics and Gynecology

## 2015-10-07 ENCOUNTER — Ambulatory Visit (HOSPITAL_COMMUNITY): Payer: Self-pay | Admitting: Psychiatry

## 2015-10-20 ENCOUNTER — Other Ambulatory Visit: Payer: Self-pay | Admitting: Obstetrics and Gynecology

## 2015-10-20 ENCOUNTER — Inpatient Hospital Stay (HOSPITAL_COMMUNITY)
Admission: AD | Admit: 2015-10-20 | Discharge: 2015-10-25 | DRG: 765 | Disposition: A | Discharge status: Home / Self Care | Payer: BLUE CROSS/BLUE SHIELD | Source: Ambulatory Visit | Attending: Obstetrics and Gynecology | Admitting: Obstetrics and Gynecology

## 2015-10-20 ENCOUNTER — Encounter (HOSPITAL_COMMUNITY): Payer: Self-pay | Admitting: *Deleted

## 2015-10-20 DIAGNOSIS — O1414 Severe pre-eclampsia complicating childbirth: Secondary | ICD-10-CM | POA: Diagnosis present

## 2015-10-20 DIAGNOSIS — O99344 Other mental disorders complicating childbirth: Secondary | ICD-10-CM | POA: Diagnosis present

## 2015-10-20 DIAGNOSIS — Z3A34 34 weeks gestation of pregnancy: Secondary | ICD-10-CM | POA: Diagnosis not present

## 2015-10-20 DIAGNOSIS — O2402 Pre-existing diabetes mellitus, type 1, in childbirth: Secondary | ICD-10-CM | POA: Diagnosis present

## 2015-10-20 DIAGNOSIS — K219 Gastro-esophageal reflux disease without esophagitis: Secondary | ICD-10-CM | POA: Diagnosis present

## 2015-10-20 DIAGNOSIS — F32A Depression, unspecified: Secondary | ICD-10-CM | POA: Diagnosis present

## 2015-10-20 DIAGNOSIS — Z8249 Family history of ischemic heart disease and other diseases of the circulatory system: Secondary | ICD-10-CM | POA: Diagnosis not present

## 2015-10-20 DIAGNOSIS — O9962 Diseases of the digestive system complicating childbirth: Secondary | ICD-10-CM | POA: Diagnosis present

## 2015-10-20 DIAGNOSIS — F329 Major depressive disorder, single episode, unspecified: Secondary | ICD-10-CM | POA: Diagnosis present

## 2015-10-20 DIAGNOSIS — Z98891 History of uterine scar from previous surgery: Secondary | ICD-10-CM

## 2015-10-20 DIAGNOSIS — O34211 Maternal care for low transverse scar from previous cesarean delivery: Secondary | ICD-10-CM | POA: Diagnosis present

## 2015-10-20 DIAGNOSIS — O364XX0 Maternal care for intrauterine death, not applicable or unspecified: Secondary | ICD-10-CM | POA: Diagnosis present

## 2015-10-20 DIAGNOSIS — E109 Type 1 diabetes mellitus without complications: Secondary | ICD-10-CM | POA: Diagnosis present

## 2015-10-20 DIAGNOSIS — F418 Other specified anxiety disorders: Secondary | ICD-10-CM | POA: Diagnosis present

## 2015-10-20 DIAGNOSIS — E1040 Type 1 diabetes mellitus with diabetic neuropathy, unspecified: Secondary | ICD-10-CM | POA: Diagnosis present

## 2015-10-20 DIAGNOSIS — Z794 Long term (current) use of insulin: Secondary | ICD-10-CM

## 2015-10-20 DIAGNOSIS — O1490 Unspecified pre-eclampsia, unspecified trimester: Secondary | ICD-10-CM | POA: Diagnosis present

## 2015-10-20 LAB — COMPREHENSIVE METABOLIC PANEL
ALBUMIN: 3.1 g/dL — AB (ref 3.5–5.0)
ALK PHOS: 174 U/L — AB (ref 38–126)
ALT: 21 U/L (ref 14–54)
ANION GAP: 12 (ref 5–15)
AST: 24 U/L (ref 15–41)
BILIRUBIN TOTAL: 0.8 mg/dL (ref 0.3–1.2)
BUN: 15 mg/dL (ref 6–20)
CALCIUM: 8.2 mg/dL — AB (ref 8.9–10.3)
CO2: 20 mmol/L — AB (ref 22–32)
Chloride: 93 mmol/L — ABNORMAL LOW (ref 101–111)
Creatinine, Ser: 0.87 mg/dL (ref 0.44–1.00)
GFR calc Af Amer: >60 mL/min (ref >60)
GFR calc non Af Amer: >60 mL/min (ref >60)
GLUCOSE: 653 mg/dL — AB (ref 65–99)
Potassium: 4.2 mmol/L (ref 3.5–5.1)
SODIUM: 125 mmol/L — AB (ref 135–145)
TOTAL PROTEIN: 7.3 g/dL (ref 6.5–8.1)

## 2015-10-20 LAB — CBC
HCT: 31.9 % — ABNORMAL LOW (ref 36.0–46.0)
Hemoglobin: 10.5 g/dL — ABNORMAL LOW (ref 12.0–15.0)
MCH: 29.6 pg (ref 26.0–34.0)
MCHC: 32.9 g/dL (ref 30.0–36.0)
MCV: 89.9 fL (ref 78.0–100.0)
PLATELETS: 252 10*3/uL (ref 150–400)
RBC: 3.55 MIL/uL — AB (ref 3.87–5.11)
RDW: 14.2 % (ref 11.5–15.5)
WBC: 8.5 10*3/uL (ref 4.0–10.5)

## 2015-10-20 LAB — URINALYSIS, ROUTINE W REFLEX MICROSCOPIC
BILIRUBIN URINE: NEGATIVE
Glucose, UA: >1000 mg/dL — AB
HGB URINE DIPSTICK: NEGATIVE
Ketones, ur: 40 mg/dL — AB
Leukocytes, UA: NEGATIVE
NITRITE: NEGATIVE
PROTEIN: NEGATIVE mg/dL
Specific Gravity, Urine: <1.005 — ABNORMAL LOW (ref 1.005–1.030)
pH: 5.5 (ref 5.0–8.0)

## 2015-10-20 LAB — URINE MICROSCOPIC-ADD ON

## 2015-10-20 LAB — APTT: APTT: 25 s (ref 24–37)

## 2015-10-20 LAB — GLUCOSE, CAPILLARY: Glucose-Capillary: >600 mg/dL (ref 65–99)

## 2015-10-20 LAB — PROTIME-INR
INR: 1 (ref 0.00–1.49)
Prothrombin Time: 13.4 seconds (ref 11.6–15.2)

## 2015-10-20 LAB — PROTEIN / CREATININE RATIO, URINE: CREATININE, URINE: 16 mg/dL

## 2015-10-20 LAB — FIBRINOGEN: Fibrinogen: 393 mg/dL (ref 204–475)

## 2015-10-20 MED ORDER — ESCITALOPRAM OXALATE 20 MG PO TABS
20.0000 mg | ORAL_TABLET | Freq: Every day | ORAL | Status: DC
  Administered 2015-10-22 – 2015-10-25 (×4): 20 mg via ORAL
  Filled 2015-10-20 (×7): qty 1

## 2015-10-20 MED ORDER — NIFEDIPINE 10 MG PO CAPS
10.0000 mg | ORAL_CAPSULE | Freq: Four times a day (QID) | ORAL | Status: DC
  Filled 2015-10-20 (×2): qty 1

## 2015-10-20 MED ORDER — INSULIN NPH (HUMAN) (ISOPHANE) 100 UNIT/ML ~~LOC~~ SUSP: 24.0000 [IU] | Freq: Two times a day (BID) | SUBCUTANEOUS | Status: DC

## 2015-10-20 MED ORDER — NIFEDIPINE 10 MG PO CAPS: 20.0000 mg | ORAL_CAPSULE | Freq: Once | ORAL | Status: DC

## 2015-10-20 MED ORDER — ACETAMINOPHEN 325 MG PO TABS: 650.0000 mg | ORAL_TABLET | ORAL | Status: DC | PRN

## 2015-10-20 MED ORDER — ZOLPIDEM TARTRATE 5 MG PO TABS
5.0000 mg | ORAL_TABLET | Freq: Every evening | ORAL | Status: DC | PRN
  Administered 2015-10-21: 5 mg via ORAL
  Filled 2015-10-20: qty 1

## 2015-10-20 MED ORDER — LACTATED RINGERS IV BOLUS (SEPSIS)
1000.0000 mL | Freq: Once | INTRAVENOUS | Status: AC
  Administered 2015-10-20: 1000 mL via INTRAVENOUS

## 2015-10-20 MED ORDER — INSULIN NPH (HUMAN) (ISOPHANE) 100 UNIT/ML ~~LOC~~ SUSP: 20.0000 [IU] | Freq: Two times a day (BID) | SUBCUTANEOUS | Status: DC

## 2015-10-20 MED ORDER — INSULIN ASPART 100 UNIT/ML ~~LOC~~ SOLN: 0.0000 [IU] | Freq: Three times a day (TID) | SUBCUTANEOUS | Status: DC

## 2015-10-20 MED ORDER — DOCUSATE SODIUM 100 MG PO CAPS
100.0000 mg | ORAL_CAPSULE | Freq: Every day | ORAL | Status: DC
  Administered 2015-10-22 – 2015-10-25 (×4): 100 mg via ORAL
  Filled 2015-10-20 (×4): qty 1

## 2015-10-20 MED ORDER — PRENATAL MULTIVITAMIN CH: 1.0000 | ORAL_TABLET | Freq: Every day | ORAL | Status: DC

## 2015-10-20 MED ORDER — ALPRAZOLAM 0.25 MG PO TABS
0.2500 mg | ORAL_TABLET | Freq: Three times a day (TID) | ORAL | Status: DC | PRN
  Administered 2015-10-22: 0.25 mg via ORAL
  Filled 2015-10-20: qty 1

## 2015-10-20 MED ORDER — CALCIUM CARBONATE ANTACID 500 MG PO CHEW: 2.0000 | CHEWABLE_TABLET | ORAL | Status: DC | PRN

## 2015-10-20 MED ORDER — LORATADINE 10 MG PO TABS
10.0000 mg | ORAL_TABLET | Freq: Every day | ORAL | Status: DC
  Filled 2015-10-20 (×2): qty 1

## 2015-10-20 MED ORDER — LACTATED RINGERS IV SOLN
INTRAVENOUS | Status: DC
  Administered 2015-10-20 – 2015-10-21 (×2): via INTRAVENOUS

## 2015-10-20 MED ORDER — NIFEDIPINE 10 MG PO CAPS
10.0000 mg | ORAL_CAPSULE | Freq: Four times a day (QID) | ORAL | Status: DC | PRN
  Filled 2015-10-20: qty 1

## 2015-10-20 MED ORDER — SODIUM CHLORIDE 0.9 % IV SOLN
INTRAVENOUS | Status: DC
  Administered 2015-10-20: 4.5 [IU]/h via INTRAVENOUS
  Administered 2015-10-20: 5.4 [IU]/h via INTRAVENOUS
  Administered 2015-10-21: 1 [IU]/h via INTRAVENOUS
  Administered 2015-10-21: 1.4 [IU]/h via INTRAVENOUS
  Administered 2015-10-21: 1.5 [IU]/h via INTRAVENOUS
  Administered 2015-10-21: 1.1 [IU]/h via INTRAVENOUS
  Administered 2015-10-21: 2.1 [IU]/h via INTRAVENOUS
  Administered 2015-10-21: 1.7 [IU]/h via INTRAVENOUS
  Administered 2015-10-21: 3.7 [IU]/h via INTRAVENOUS
  Administered 2015-10-21: 2.5 [IU]/h via INTRAVENOUS
  Administered 2015-10-21: 1.8 [IU]/h via INTRAVENOUS
  Administered 2015-10-21 (×2): 2.4 [IU]/h via INTRAVENOUS
  Filled 2015-10-20: qty 2.5
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
## 2015-10-20 NOTE — MAU Note (Signed)
Patient presents to mau with c/o contractions 3-6 minutes apart; endorse vaginal discomfort. States scheduled for a c-section tomorrow.

## 2015-10-20 NOTE — Progress Notes (Signed)
Antepartum LOS: 0 [name], 35 y.o.   OB History    Gravida Para Term Preterm AB TAB SAB Ectopic Multiple Living   Subjective -Patient presents, to MAU, for contractions that started this afternoon and has increased in frequency and intensity.  Patient appropriately tearful and requests that if in active labor she desires a repeat C/S.  Patient further requests that a repeat US be performed, to assess FHT, prior to C/S rather tonight or today.  Patient reports minimal appetite today and states last BS at lunch was 220.  Patient s/p initiation of LR bolus and reports contractions have since subsided.  Nurse reports BG of >600 on glucometer.    Objective  Filed Vitals:   10/20/15 2049 10/20/15 2209  BP: 153/95 143/91  Pulse: 108 92  Temp: 98 F (36.7 C) 98.8 F (37.1 C)  TempSrc: Oral Oral  Resp: 17 18  Height:  (1.626 m)   Weight: 70.761 kg (156 lb)   SpO2: 100% 100%    Results for orders placed or performed during the hospital encounter of 10/20/15 (from the past 24 hour(s))  Type and screen [facility]     Status: None   Collection Time: 10/20/15  9:20 PM  Result Value Ref Range   ABO/RH(D) B POS    Antibody Screen NEG    Sample Expiration 10/23/2015   CBC     Status: Abnormal   Collection Time: 10/20/15  9:20 PM  Result Value Ref Range   WBC 8.5 4.0 - 10.5 K/uL   RBC 3.55 (L) 3.87 - 5.11 MIL/uL   Hemoglobin 10.5 (L) 12.0 - 15.0 g/dL   HCT 09.8 (L) 11.9 - 14.7 %   MCV 89.9 78.0 - 100.0 fL   MCH 29.6 26.0 - 34.0 pg   MCHC 32.9 30.0 - 36.0 g/dL   RDW 82.9 56.2 - 13.0 %   Platelets 252 150 - 400 K/uL  Urinalysis, Routine w reflex microscopic (not at [facility])     Status: Abnormal   Collection Time: 10/20/15  9:30 PM  Result Value Ref Range   Color, Urine YELLOW YELLOW   APPearance CLEAR CLEAR   Specific Gravity, Urine <1.005 (L) 1.005 - 1.030   pH 5.5 5.0 - 8.0   Glucose, UA >1000 (A) NEGATIVE mg/dL   Hgb urine  dipstick NEGATIVE NEGATIVE   Bilirubin Urine NEGATIVE NEGATIVE   Ketones, ur 40 (A) NEGATIVE mg/dL   Protein, ur NEGATIVE NEGATIVE mg/dL   Nitrite NEGATIVE NEGATIVE   Leukocytes, UA NEGATIVE NEGATIVE  Protein / creatinine ratio, urine     Status: None (Preliminary result)   Collection Time: 10/20/15  9:30 PM  Result Value Ref Range   Creatinine, Urine 16.00 mg/dL   Total Protein, Urine PENDING mg/dL   Protein Creatinine Ratio PENDING 0.00 - 0.15 mg/mg[Cre]  Urine microscopic-add on     Status: Abnormal   Collection Time: 10/20/15  9:30 PM  Result Value Ref Range   Squamous Epithelial / LPF 0-5 (A) NONE SEEN   WBC, UA 0-5 0 - 5 WBC/hpf   RBC / HPF 0-5 0 - 5 RBC/hpf   Bacteria, UA FEW (A) NONE SEEN  Comprehensive metabolic panel     Status: Abnormal   Collection Time: 10/20/15  9:38 PM  Result Value Ref Range   Sodium 125 (L) 135 - 145 mmol/L   Potassium 4.2 3.5 - 5.1  mmol/L   Chloride 93 (L) 101 - 111 mmol/L   CO2 20 (L) 22 - 32 mmol/L   Glucose, Bld 653 (HH) 65 - 99 mg/dL   BUN 15 6 - 20 mg/dL   Creatinine, Ser 1.61 0.44 - 1.00 mg/dL   Calcium 8.2 (L) 8.9 - 10.3 mg/dL   Total Protein 7.3 6.5 - 8.1 g/dL   Albumin 3.1 (L) 3.5 - 5.0 g/dL   AST 24 15 - 41 U/L   ALT 21 14 - 54 U/L   Alkaline Phosphatase 174 (H) 38 - 126 U/L   Total Bilirubin 0.8 0.3 - 1.2 mg/dL   GFR calc non Af Amer >60 >60 mL/min   GFR calc Af Amer >60 >60 mL/min   Anion gap 12 5 - 15  Protime-INR     Status: None   Collection Time: 10/20/15  9:38 PM  Result Value Ref Range   Prothrombin Time 13.4 11.6 - 15.2 seconds   INR 1.00 0.00 - 1.49  APTT     Status: None   Collection Time: 10/20/15  9:38 PM  Result Value Ref Range   aPTT 25 24 - 37 seconds  Fibrinogen     Status: None   Collection Time: 10/20/15  9:38 PM  Result Value Ref Range   Fibrinogen 393 204 - 475 mg/dL  Glucose, capillary     Status: Abnormal   Collection Time: 10/20/15  9:59 PM  Result Value Ref Range   Glucose-Capillary >600 (HH)  65 - 99 mg/dL    Meds: Scheduled Meds: . [START ON 10/21/2015] docusate sodium  100 mg Oral Daily  . [START ON 10/21/2015] escitalopram  20 mg Oral Daily  . lactated ringers  1,000 mL Intravenous Once  . [START ON 10/21/2015] loratadine  10 mg Oral Daily  . [START ON 10/21/2015] prenatal multivitamin  1 tablet Oral Q1200   Continuous Infusions: . insulin (NOVOLIN-R) infusion    . lactated ringers 125 mL/hr at 10/20/15 2222   PRN Meds:.acetaminophen, ALPRAZolam, calcium carbonate, NIFEdipine, zolpidem   Physical Exam  Constitutional: She is oriented to person, place, and time. She appears well-developed and well-nourished. She appears distressed.  HENT:  Head: Normocephalic and atraumatic.  Eyes: EOM are normal.  Neck: Normal range of motion.  Cardiovascular: Normal rate.   Pulmonary/Chest: Effort normal.  Abdominal: Soft.  Gravid--fundal height appears AGA, Soft, NT  Genitourinary:  SVE: CL/Thick/Ballotable/Soft/Posterior  Musculoskeletal: Normal range of motion.  Neurological: She is alert and oriented to person, place, and time.  Skin: Skin is warm and dry.  Psychiatric: She has a normal mood and affect.  :   Assessment IUFD at [redacted]w[redacted]d DM Type 1-Uncontrolled BS PreEclampsia Desires Repeat C/S Contractions-Subsided  Plan Glucose per lab-653 Dr. Delrae Sawyers consulted and advised initiation of Glucostabilizer Parameters set as 90 for low and 120 for high    D/C Carb Modified Diet-NPO until further notice Standing order for glucose testing via lab when parameters >600 on glucometer Initiate toco if patient c/o contractions LR infusion 147mL/hr throughout the night Consult to Chaplain and SW  Nurse instructed to call with any questions or concerns  Cherre Robins, MSN, CNM 10/20/2015, 10:26 PM

## 2015-10-21 ENCOUNTER — Encounter (HOSPITAL_COMMUNITY)
Admission: AD | Disposition: A | Discharge status: Home / Self Care | Payer: Self-pay | Source: Ambulatory Visit | Attending: Obstetrics and Gynecology

## 2015-10-21 ENCOUNTER — Inpatient Hospital Stay (HOSPITAL_COMMUNITY): Payer: BLUE CROSS/BLUE SHIELD | Admitting: Anesthesiology

## 2015-10-21 ENCOUNTER — Encounter (HOSPITAL_COMMUNITY): Payer: Self-pay | Admitting: *Deleted

## 2015-10-21 ENCOUNTER — Inpatient Hospital Stay (HOSPITAL_COMMUNITY)
Admission: AD | Admit: 2015-10-21 | Payer: BLUE CROSS/BLUE SHIELD | Source: Ambulatory Visit | Admitting: Obstetrics and Gynecology

## 2015-10-21 DIAGNOSIS — O364XX0 Maternal care for intrauterine death, not applicable or unspecified: Secondary | ICD-10-CM | POA: Diagnosis present

## 2015-10-21 LAB — CBC
HEMATOCRIT: 30 % — AB (ref 36.0–46.0)
HEMOGLOBIN: 9.9 g/dL — AB (ref 12.0–15.0)
MCH: 28.8 pg (ref 26.0–34.0)
MCHC: 33 g/dL (ref 30.0–36.0)
MCV: 87.2 fL (ref 78.0–100.0)
Platelets: 257 10*3/uL (ref 150–400)
RBC: 3.44 MIL/uL — ABNORMAL LOW (ref 3.87–5.11)
RDW: 14.1 % (ref 11.5–15.5)
WBC: 6.9 10*3/uL (ref 4.0–10.5)

## 2015-10-21 LAB — GLUCOSE, CAPILLARY
GLUCOSE-CAPILLARY: 105 mg/dL — AB (ref 65–99)
GLUCOSE-CAPILLARY: 108 mg/dL — AB (ref 65–99)
GLUCOSE-CAPILLARY: 111 mg/dL — AB (ref 65–99)
GLUCOSE-CAPILLARY: 112 mg/dL — AB (ref 65–99)
GLUCOSE-CAPILLARY: 116 mg/dL — AB (ref 65–99)
GLUCOSE-CAPILLARY: 140 mg/dL — AB (ref 65–99)
GLUCOSE-CAPILLARY: 186 mg/dL — AB (ref 65–99)
GLUCOSE-CAPILLARY: 239 mg/dL — AB (ref 65–99)
GLUCOSE-CAPILLARY: 302 mg/dL — AB (ref 65–99)
GLUCOSE-CAPILLARY: 425 mg/dL — AB (ref 65–99)
Glucose-Capillary: 104 mg/dL — ABNORMAL HIGH (ref 65–99)
Glucose-Capillary: 108 mg/dL — ABNORMAL HIGH (ref 65–99)
Glucose-Capillary: 109 mg/dL — ABNORMAL HIGH (ref 65–99)
Glucose-Capillary: 114 mg/dL — ABNORMAL HIGH (ref 65–99)
Glucose-Capillary: 120 mg/dL — ABNORMAL HIGH (ref 65–99)
Glucose-Capillary: 120 mg/dL — ABNORMAL HIGH (ref 65–99)
Glucose-Capillary: 130 mg/dL — ABNORMAL HIGH (ref 65–99)
Glucose-Capillary: 226 mg/dL — ABNORMAL HIGH (ref 65–99)
Glucose-Capillary: 508 mg/dL — ABNORMAL HIGH (ref 65–99)
Glucose-Capillary: 58 mg/dL — ABNORMAL LOW (ref 65–99)
Glucose-Capillary: 92 mg/dL (ref 65–99)
Glucose-Capillary: 94 mg/dL (ref 65–99)
Glucose-Capillary: 97 mg/dL (ref 65–99)

## 2015-10-21 LAB — PREPARE RBC (CROSSMATCH)

## 2015-10-21 LAB — MRSA PCR SCREENING: MRSA by PCR: NEGATIVE

## 2015-10-21 SURGERY — Surgical Case
Anesthesia: Spinal | Site: Abdomen

## 2015-10-21 MED ORDER — PHENYLEPHRINE 8 MG IN D5W 100 ML (0.08MG/ML) PREMIX OPTIME
INJECTION | INTRAVENOUS | Status: DC | PRN
  Administered 2015-10-21: 20 ug/min via INTRAVENOUS

## 2015-10-21 MED ORDER — BUPIVACAINE HCL (PF) 0.25 % IJ SOLN
INTRAMUSCULAR | Status: AC
  Filled 2015-10-21: qty 30

## 2015-10-21 MED ORDER — CITRIC ACID-SODIUM CITRATE 334-500 MG/5ML PO SOLN
ORAL | Status: AC
  Administered 2015-10-21: 30 mL
  Filled 2015-10-21: qty 15

## 2015-10-21 MED ORDER — OXYTOCIN 10 UNIT/ML IJ SOLN: 2.5000 [IU]/h | INTRAVENOUS | Status: AC

## 2015-10-21 MED ORDER — MORPHINE SULFATE (PF) 0.5 MG/ML IJ SOLN
INTRAMUSCULAR | Status: AC
  Filled 2015-10-21: qty 10

## 2015-10-21 MED ORDER — IBUPROFEN 600 MG PO TABS
600.0000 mg | ORAL_TABLET | Freq: Four times a day (QID) | ORAL | Status: DC
  Administered 2015-10-21 – 2015-10-25 (×14): 600 mg via ORAL
  Filled 2015-10-21 (×15): qty 1

## 2015-10-21 MED ORDER — NALBUPHINE HCL 10 MG/ML IJ SOLN
5.0000 mg | Freq: Once | INTRAMUSCULAR | Status: AC | PRN
  Administered 2015-10-21: 5 mg via SUBCUTANEOUS

## 2015-10-21 MED ORDER — SODIUM CHLORIDE 0.9 % IR SOLN
Status: DC | PRN
Start: 2015-10-21 — End: 2015-10-21
  Administered 2015-10-21: 2000 mL

## 2015-10-21 MED ORDER — NALBUPHINE HCL 10 MG/ML IJ SOLN
INTRAMUSCULAR | Status: AC
  Filled 2015-10-21: qty 1

## 2015-10-21 MED ORDER — CEFAZOLIN SODIUM-DEXTROSE 2-3 GM-% IV SOLR
2.0000 g | INTRAVENOUS | Status: AC
  Administered 2015-10-21: 2 g via INTRAVENOUS
  Filled 2015-10-21: qty 50

## 2015-10-21 MED ORDER — METHYLERGONOVINE MALEATE 0.2 MG/ML IJ SOLN: 0.2000 mg | INTRAMUSCULAR | Status: DC | PRN

## 2015-10-21 MED ORDER — SIMETHICONE 80 MG PO CHEW
80.0000 mg | CHEWABLE_TABLET | ORAL | Status: DC
  Administered 2015-10-22 – 2015-10-24 (×3): 80 mg via ORAL
  Filled 2015-10-21 (×4): qty 1

## 2015-10-21 MED ORDER — CEFAZOLIN SODIUM-DEXTROSE 2-3 GM-% IV SOLR
INTRAVENOUS | Status: AC
  Filled 2015-10-21: qty 50

## 2015-10-21 MED ORDER — WITCH HAZEL-GLYCERIN EX PADS: 1.0000 "application " | MEDICATED_PAD | CUTANEOUS | Status: DC | PRN

## 2015-10-21 MED ORDER — OXYTOCIN 10 UNIT/ML IJ SOLN
INTRAMUSCULAR | Status: AC
  Filled 2015-10-21: qty 4

## 2015-10-21 MED ORDER — DIBUCAINE 1 % RE OINT: 1.0000 "application " | TOPICAL_OINTMENT | RECTAL | Status: DC | PRN

## 2015-10-21 MED ORDER — SENNOSIDES-DOCUSATE SODIUM 8.6-50 MG PO TABS
2.0000 | ORAL_TABLET | ORAL | Status: DC
  Administered 2015-10-22 – 2015-10-24 (×3): 2 via ORAL
  Filled 2015-10-21 (×4): qty 2

## 2015-10-21 MED ORDER — LACTATED RINGERS IV SOLN
INTRAVENOUS | Status: DC | PRN
  Administered 2015-10-21: 13:00:00 via INTRAVENOUS

## 2015-10-21 MED ORDER — NALOXONE HCL 2 MG/2ML IJ SOSY
1.0000 ug/kg/h | PREFILLED_SYRINGE | INTRAVENOUS | Status: DC | PRN
  Filled 2015-10-21: qty 2

## 2015-10-21 MED ORDER — METHYLERGONOVINE MALEATE 0.2 MG PO TABS: 0.2000 mg | ORAL_TABLET | ORAL | Status: DC | PRN

## 2015-10-21 MED ORDER — DEXTROSE IN LACTATED RINGERS 5 % IV SOLN
INTRAVENOUS | Status: DC
  Administered 2015-10-21 (×5): via INTRAVENOUS

## 2015-10-21 MED ORDER — ALBUMIN HUMAN 5 % IV SOLN
INTRAVENOUS | Status: DC | PRN
  Administered 2015-10-21: 14:00:00 via INTRAVENOUS

## 2015-10-21 MED ORDER — FERROUS SULFATE 325 (65 FE) MG PO TABS
325.0000 mg | ORAL_TABLET | Freq: Two times a day (BID) | ORAL | Status: DC
  Administered 2015-10-21 – 2015-10-25 (×7): 325 mg via ORAL
  Filled 2015-10-21 (×7): qty 1

## 2015-10-21 MED ORDER — NALBUPHINE HCL 10 MG/ML IJ SOLN: 5.0000 mg | INTRAMUSCULAR | Status: DC | PRN

## 2015-10-21 MED ORDER — SIMETHICONE 80 MG PO CHEW
80.0000 mg | CHEWABLE_TABLET | Freq: Three times a day (TID) | ORAL | Status: DC
  Administered 2015-10-21 – 2015-10-25 (×11): 80 mg via ORAL
  Filled 2015-10-21 (×11): qty 1

## 2015-10-21 MED ORDER — MEASLES, MUMPS & RUBELLA VAC ~~LOC~~ INJ: 0.5000 mL | INJECTION | Freq: Once | SUBCUTANEOUS | Status: DC

## 2015-10-21 MED ORDER — SODIUM CHLORIDE 0.9 % IV SOLN
INTRAVENOUS | Status: DC
  Administered 2015-10-21: 1.3 [IU]/h via INTRAVENOUS
  Administered 2015-10-22: 2.7 [IU]/h via INTRAVENOUS
  Filled 2015-10-21 (×2): qty 2.5

## 2015-10-21 MED ORDER — SCOPOLAMINE 1 MG/3DAYS TD PT72
MEDICATED_PATCH | TRANSDERMAL | Status: AC
  Administered 2015-10-21: 1.5 mg
  Filled 2015-10-21: qty 1

## 2015-10-21 MED ORDER — DEXTROSE 50 % IV SOLN
INTRAVENOUS | Status: AC
  Filled 2015-10-21: qty 50

## 2015-10-21 MED ORDER — BUPIVACAINE HCL (PF) 0.25 % IJ SOLN
INTRAMUSCULAR | Status: DC | PRN
  Administered 2015-10-21: 20 mL

## 2015-10-21 MED ORDER — DIPHENHYDRAMINE HCL 25 MG PO CAPS: 25.0000 mg | ORAL_CAPSULE | Freq: Four times a day (QID) | ORAL | Status: DC | PRN

## 2015-10-21 MED ORDER — MENTHOL 3 MG MT LOZG
1.0000 | LOZENGE | OROMUCOSAL | Status: DC | PRN
  Filled 2015-10-21: qty 9

## 2015-10-21 MED ORDER — ALBUMIN HUMAN 5 % IV SOLN
INTRAVENOUS | Status: AC
  Filled 2015-10-21: qty 250

## 2015-10-21 MED ORDER — PHENYLEPHRINE 8 MG IN D5W 100 ML (0.08MG/ML) PREMIX OPTIME
INJECTION | INTRAVENOUS | Status: AC
  Filled 2015-10-21: qty 100

## 2015-10-21 MED ORDER — ONDANSETRON HCL 4 MG/2ML IJ SOLN
INTRAMUSCULAR | Status: AC
  Filled 2015-10-21: qty 2

## 2015-10-21 MED ORDER — OXYTOCIN 40 UNITS IN LACTATED RINGERS INFUSION - SIMPLE MED
INTRAVENOUS | Status: DC | PRN
  Administered 2015-10-21: 40 [IU] via INTRAVENOUS

## 2015-10-21 MED ORDER — PROMETHAZINE HCL 25 MG/ML IJ SOLN: 6.2500 mg | INTRAMUSCULAR | Status: DC | PRN

## 2015-10-21 MED ORDER — MORPHINE SULFATE (PF) 0.5 MG/ML IJ SOLN
INTRAMUSCULAR | Status: DC | PRN
  Administered 2015-10-21: .2 mg via INTRATHECAL

## 2015-10-21 MED ORDER — MIDAZOLAM HCL 2 MG/2ML IJ SOLN: 0.5000 mg | Freq: Once | INTRAMUSCULAR | Status: DC | PRN

## 2015-10-21 MED ORDER — PHENYLEPHRINE 40 MCG/ML (10ML) SYRINGE FOR IV PUSH (FOR BLOOD PRESSURE SUPPORT)
PREFILLED_SYRINGE | INTRAVENOUS | Status: AC
  Filled 2015-10-21: qty 10

## 2015-10-21 MED ORDER — BUPIVACAINE IN DEXTROSE 0.75-8.25 % IT SOLN
INTRATHECAL | Status: DC | PRN
  Administered 2015-10-21: 10.5 mL via INTRATHECAL

## 2015-10-21 MED ORDER — ACETAMINOPHEN 325 MG PO TABS
650.0000 mg | ORAL_TABLET | ORAL | Status: DC | PRN
  Administered 2015-10-21 – 2015-10-24 (×2): 650 mg via ORAL
  Filled 2015-10-21 (×2): qty 2

## 2015-10-21 MED ORDER — NALBUPHINE HCL 10 MG/ML IJ SOLN: 5.0000 mg | Freq: Once | INTRAMUSCULAR | Status: AC | PRN

## 2015-10-21 MED ORDER — LANOLIN HYDROUS EX OINT: 1.0000 "application " | TOPICAL_OINTMENT | CUTANEOUS | Status: DC | PRN

## 2015-10-21 MED ORDER — SODIUM CHLORIDE 0.9 % IV SOLN: INTRAVENOUS | Status: DC

## 2015-10-21 MED ORDER — SCOPOLAMINE 1 MG/3DAYS TD PT72
1.0000 | MEDICATED_PATCH | TRANSDERMAL | Status: DC
  Filled 2015-10-21: qty 1

## 2015-10-21 MED ORDER — SIMETHICONE 80 MG PO CHEW: 80.0000 mg | CHEWABLE_TABLET | ORAL | Status: DC | PRN

## 2015-10-21 MED ORDER — SCOPOLAMINE 1 MG/3DAYS TD PT72: 1.0000 | MEDICATED_PATCH | Freq: Once | TRANSDERMAL | Status: DC

## 2015-10-21 MED ORDER — ONDANSETRON HCL 4 MG/2ML IJ SOLN: 4.0000 mg | Freq: Three times a day (TID) | INTRAMUSCULAR | Status: DC | PRN

## 2015-10-21 MED ORDER — FENTANYL CITRATE (PF) 100 MCG/2ML IJ SOLN
INTRAMUSCULAR | Status: DC | PRN
  Administered 2015-10-21: 10 ug via INTRATHECAL

## 2015-10-21 MED ORDER — NALOXONE HCL 0.4 MG/ML IJ SOLN: 0.4000 mg | INTRAMUSCULAR | Status: DC | PRN

## 2015-10-21 MED ORDER — FENTANYL CITRATE (PF) 100 MCG/2ML IJ SOLN
INTRAMUSCULAR | Status: AC
  Filled 2015-10-21: qty 2

## 2015-10-21 MED ORDER — PRENATAL MULTIVITAMIN CH
1.0000 | ORAL_TABLET | Freq: Every day | ORAL | Status: DC
  Administered 2015-10-22 – 2015-10-25 (×4): 1 via ORAL
  Filled 2015-10-21 (×4): qty 1

## 2015-10-21 MED ORDER — DEXTROSE 50 % IV SOLN
50.0000 mL | Freq: Once | INTRAVENOUS | Status: AC
  Administered 2015-10-21: 50 mL via INTRAVENOUS

## 2015-10-21 MED ORDER — ONDANSETRON HCL 4 MG/2ML IJ SOLN
INTRAMUSCULAR | Status: DC | PRN
  Administered 2015-10-21: 4 mg via INTRAVENOUS

## 2015-10-21 MED ORDER — CEFAZOLIN SODIUM-DEXTROSE 2-3 GM-% IV SOLR: 2.0000 g | INTRAVENOUS | Status: DC

## 2015-10-21 MED ORDER — TETANUS-DIPHTH-ACELL PERTUSSIS 5-2.5-18.5 LF-MCG/0.5 IM SUSP: 0.5000 mL | Freq: Once | INTRAMUSCULAR | Status: DC

## 2015-10-21 MED ORDER — SODIUM CHLORIDE 0.9% FLUSH: 3.0000 mL | INTRAVENOUS | Status: DC | PRN

## 2015-10-21 MED ORDER — DIPHENHYDRAMINE HCL 50 MG/ML IJ SOLN: 12.5000 mg | INTRAMUSCULAR | Status: DC | PRN

## 2015-10-21 MED ORDER — ZOLPIDEM TARTRATE 5 MG PO TABS
5.0000 mg | ORAL_TABLET | Freq: Every evening | ORAL | Status: DC | PRN
  Administered 2015-10-24 (×2): 5 mg via ORAL
  Filled 2015-10-21 (×3): qty 1

## 2015-10-21 MED ORDER — DIPHENHYDRAMINE HCL 25 MG PO CAPS
25.0000 mg | ORAL_CAPSULE | ORAL | Status: DC | PRN
  Administered 2015-10-21 – 2015-10-22 (×2): 25 mg via ORAL
  Filled 2015-10-21 (×3): qty 1

## 2015-10-21 MED ORDER — MEPERIDINE HCL 25 MG/ML IJ SOLN: 6.2500 mg | INTRAMUSCULAR | Status: DC | PRN

## 2015-10-21 SURGICAL SUPPLY — 25 items
BENZOIN TINCTURE PRP APPL 2/3 (GAUZE/BANDAGES/DRESSINGS) ×2 IMPLANT
BOOTIES KNEE HIGH SLOAN (MISCELLANEOUS) ×4 IMPLANT
CLOTH BEACON ORANGE TIMEOUT ST (SAFETY) ×2 IMPLANT
DRSG OPSITE POSTOP 4X10 (GAUZE/BANDAGES/DRESSINGS) ×2 IMPLANT
DURAPREP 26ML APPLICATOR (WOUND CARE) ×2 IMPLANT
ELECT REM PT RETURN 9FT ADLT (ELECTROSURGICAL) ×2
ELECTRODE REM PT RTRN 9FT ADLT (ELECTROSURGICAL) ×1 IMPLANT
GLOVE BIOGEL PI IND STRL 7.0 (GLOVE) ×2 IMPLANT
GLOVE BIOGEL PI INDICATOR 7.0 (GLOVE) ×2
GLOVE ECLIPSE 6.5 STRL STRAW (GLOVE) ×2 IMPLANT
GOWN STRL REUS W/TWL LRG LVL3 (GOWN DISPOSABLE) ×4 IMPLANT
NEEDLE HYPO 22GX1.5 SAFETY (NEEDLE) ×2 IMPLANT
NS IRRIG 1000ML POUR BTL (IV SOLUTION) ×4 IMPLANT
PACK C SECTION WH (CUSTOM PROCEDURE TRAY) ×2 IMPLANT
PAD OB MATERNITY 4.3X12.25 (PERSONAL CARE ITEMS) ×2 IMPLANT
PENCIL SMOKE EVAC W/HOLSTER (ELECTROSURGICAL) ×2 IMPLANT
RTRCTR C-SECT PINK 25CM LRG (MISCELLANEOUS) ×2 IMPLANT
STRIP CLOSURE SKIN 1/2X4 (GAUZE/BANDAGES/DRESSINGS) ×2 IMPLANT
SUT MNCRL AB 3-0 PS2 27 (SUTURE) ×2 IMPLANT
SUT VIC AB 0 CTX 36 (SUTURE) ×3
SUT VIC AB 0 CTX36XBRD ANBCTRL (SUTURE) ×3 IMPLANT
SUT VIC AB 1 CT1 36 (SUTURE) ×6 IMPLANT
SYR 20CC LL (SYRINGE) ×2 IMPLANT
TOWEL OR 17X24 6PK STRL BLUE (TOWEL DISPOSABLE) ×2 IMPLANT
TRAY FOLEY CATH SILVER 14FR (SET/KITS/TRAYS/PACK) ×2 IMPLANT

## 2015-10-21 NOTE — Progress Notes (Signed)
Subjective: Pt appropriately tearful and sorrowful regarding fetal demise.  Pt husband, parents, and sister in room providing comfort.  Questions regarding Cesarean sections answered- concerned about becoming nausea [place] during procedure. Pt reassured that she will be premedicated, observed and treated if she begins to develop nausea.  When asked if she was able to get any rest, pt indicated "no, I wanted to feel what it is like to be pregnant as long as possible".  Reassurance provided.    When leaving the pt's room, pt's parents voiced concern over their daughters mental health/well being due to a history of depression and anxiety. Reassurance provided that [facility] is also concerned for her well being & mental health.  It was also mentioned that the pt's sister had recently had a pregnancy loss/miscarriage at 8 wks and their hope is that the sister could console each other.   Objective: BP 104/77 mmHg  Pulse 91  Temp(Src) 98.1 F (36.7 C) (Oral)  Resp 16  Ht 5\' 4"  (1.626 m)  Wt 70.761 kg (156 lb)  BMI 26.76 kg/m2  SpO2 99%  LMP 02/03/2015 I/O last 3 completed shifts: In: 2506.1 [P.O.:480; I.V.:1026.1; IV Piggyback:1000] Out: 1900 [Urine:1900] Total I/O In: -  Out: 400 [Urine:400]   UC:   none SVE:   Dilation: Closed Effacement (%): Thick Station: Ballotable Exam by:: Gerrit Heck, CNM   Labs:  Results for orders placed or performed during the hospital encounter of 10/20/15 (from the past 24 hour(s))  Type and screen [facility] [place]     Status: None (Preliminary result)   Collection Time: 10/20/15  9:20 PM  Result Value Ref Range   ABO/RH(D) B POS    Antibody Screen NEG    Sample Expiration 10/23/2015    Unit Number [id number]    Blood Component Type RED CELLS,LR    Unit division 00    Status of Unit ALLOCATED    Transfusion Status OK TO TRANSFUSE    Crossmatch Result Compatible    Unit Number [id number]    Blood Component Type RED CELLS,LR    Unit  division 00    Status of Unit ALLOCATED    Transfusion Status OK TO TRANSFUSE    Crossmatch Result Compatible   CBC     Status: Abnormal   Collection Time: 10/20/15  9:20 PM  Result Value Ref Range   WBC 8.5 4.0 - 10.5 K/uL   RBC 3.55 (L) 3.87 - 5.11 MIL/uL   Hemoglobin 10.5 (L) 12.0 - 15.0 g/dL   HCT 30.8 (L) 65.7 - 84.6 %   MCV 89.9 78.0 - 100.0 fL   MCH 29.6 26.0 - 34.0 pg   MCHC 32.9 30.0 - 36.0 g/dL   RDW 96.2 95.2 - 84.1 %   Platelets 252 150 - 400 K/uL  Urinalysis, Routine w reflex microscopic (not at [facility])     Status: Abnormal   Collection Time: 10/20/15  9:30 PM  Result Value Ref Range   Color, Urine YELLOW YELLOW   APPearance CLEAR CLEAR   Specific Gravity, Urine <1.005 (L) 1.005 - 1.030   pH 5.5 5.0 - 8.0   Glucose, UA >1000 (A) NEGATIVE mg/dL   Hgb urine dipstick NEGATIVE NEGATIVE   Bilirubin Urine NEGATIVE NEGATIVE   Ketones, ur 40 (A) NEGATIVE mg/dL   Protein, ur NEGATIVE NEGATIVE mg/dL   Nitrite NEGATIVE NEGATIVE   Leukocytes, UA NEGATIVE NEGATIVE  Protein / creatinine ratio, urine     Status: None   Collection Time: 10/20/15  9:30 PM  Result Value Ref Range   Creatinine, Urine 16.00 mg/dL   Total Protein, Urine <6 mg/dL   Protein Creatinine Ratio        0.00 - 0.15 mg/mg[Cre]  Urine microscopic-add on     Status: Abnormal   Collection Time: 10/20/15  9:30 PM  Result Value Ref Range   Squamous Epithelial / LPF 0-5 (A) NONE SEEN   WBC, UA 0-5 0 - 5 WBC/hpf   RBC / HPF 0-5 0 - 5 RBC/hpf   Bacteria, UA FEW (A) NONE SEEN  Comprehensive metabolic panel     Status: Abnormal   Collection Time: 10/20/15  9:38 PM  Result Value Ref Range   Sodium 125 (L) 135 - 145 mmol/L   Potassium 4.2 3.5 - 5.1 mmol/L   Chloride 93 (L) 101 - 111 mmol/L   CO2 20 (L) 22 - 32 mmol/L   Glucose, Bld 653 (HH) 65 - 99 mg/dL   BUN 15 6 - 20 mg/dL   Creatinine, Ser 1.61 0.44 - 1.00 mg/dL   Calcium 8.2 (L) 8.9 - 10.3 mg/dL   Total Protein 7.3 6.5 - 8.1 g/dL   Albumin 3.1 (L) 3.5 -  5.0 g/dL   AST 24 15 - 41 U/L   ALT 21 14 - 54 U/L   Alkaline Phosphatase 174 (H) 38 - 126 U/L   Total Bilirubin 0.8 0.3 - 1.2 mg/dL   GFR calc non Af Amer >60 >60 mL/min   GFR calc Af Amer >60 >60 mL/min   Anion gap 12 5 - 15  Protime-INR     Status: None   Collection Time: 10/20/15  9:38 PM  Result Value Ref Range   Prothrombin Time 13.4 11.6 - 15.2 seconds   INR 1.00 0.00 - 1.49  APTT     Status: None   Collection Time: 10/20/15  9:38 PM  Result Value Ref Range   aPTT 25 24 - 37 seconds  Fibrinogen     Status: None   Collection Time: 10/20/15  9:38 PM  Result Value Ref Range   Fibrinogen 393 204 - 475 mg/dL  Glucose, capillary     Status: Abnormal   Collection Time: 10/20/15  9:59 PM  Result Value Ref Range   Glucose-Capillary >600 (HH) 65 - 99 mg/dL  Glucose, capillary     Status: Abnormal   Collection Time: 10/20/15 11:44 PM  Result Value Ref Range   Glucose-Capillary 508 (H) 65 - 99 mg/dL   Comment 1 Document in Chart   Glucose, capillary     Status: Abnormal   Collection Time: 10/21/15 12:40 AM  Result Value Ref Range   Glucose-Capillary 425 (H) 65 - 99 mg/dL  MRSA PCR Screening     Status: None   Collection Time: 10/21/15  1:37 AM  Result Value Ref Range   MRSA by PCR NEGATIVE NEGATIVE  Glucose, capillary     Status: Abnormal   Collection Time: 10/21/15  1:43 AM  Result Value Ref Range   Glucose-Capillary 302 (H) 65 - 99 mg/dL  Glucose, capillary     Status: Abnormal   Collection Time: 10/21/15  2:44 AM  Result Value Ref Range   Glucose-Capillary 226 (H) 65 - 99 mg/dL  Glucose, capillary     Status: Abnormal   Collection Time: 10/21/15  3:42 AM  Result Value Ref Range   Glucose-Capillary 186 (H) 65 - 99 mg/dL  Glucose, capillary     Status: Abnormal   Collection  Time: 10/21/15  4:41 AM  Result Value Ref Range   Glucose-Capillary 140 (H) 65 - 99 mg/dL  Glucose, capillary     Status: None   Collection Time: 10/21/15  5:48 AM  Result Value Ref Range    Glucose-Capillary 97 65 - 99 mg/dL  Glucose, capillary     Status: None   Collection Time: 10/21/15  6:39 AM  Result Value Ref Range   Glucose-Capillary 94 65 - 99 mg/dL  Glucose, capillary     Status: Abnormal   Collection Time: 10/21/15  7:44 AM  Result Value Ref Range   Glucose-Capillary 109 (H) 65 - 99 mg/dL  Prepare RBC (crossmatch)     Status: None   Collection Time: 10/21/15  8:30 AM  Result Value Ref Range   Order Confirmation ORDER PROCESSED BY BLOOD BANK   CBC     Status: Abnormal   Collection Time: 10/21/15  8:47 AM  Result Value Ref Range   WBC 6.9 4.0 - 10.5 K/uL   RBC 3.44 (L) 3.87 - 5.11 MIL/uL   Hemoglobin 9.9 (L) 12.0 - 15.0 g/dL   HCT 16.1 (L) 09.6 - 04.5 %   MCV 87.2 78.0 - 100.0 fL   MCH 28.8 26.0 - 34.0 pg   MCHC 33.0 30.0 - 36.0 g/dL   RDW 40.9 81.1 - 91.4 %   Platelets 257 150 - 400 K/uL  Glucose, capillary     Status: Abnormal   Collection Time: 10/21/15  8:55 AM  Result Value Ref Range   Glucose-Capillary 111 (H) 65 - 99 mg/dL  Glucose, capillary     Status: Abnormal   Collection Time: 10/21/15  9:41 AM  Result Value Ref Range   Glucose-Capillary 108 (H) 65 - 99 mg/dL   . ceFAZolin      .  ceFAZolin (ANCEF) IV  2 g Intravenous On Call to OR  . citric acid-sodium citrate      . docusate sodium  100 mg Oral Daily  . escitalopram  20 mg Oral Daily  . loratadine  10 mg Oral Daily  . prenatal multivitamin  1 tablet Oral Q1200  . scopolamine  1 patch Transdermal Q72H  . 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 Talmage Nap recommendations on 10/05/15:  Humulin 24U AM & 28U PM  Novolog Correction: 70-100-4U, 101-150-5U, 151-200-6U, 201-250-7U, 251-300-8U Xanax for anxiety and/or sleep SW consult Pastoral Care consult   Alphonzo Severance CNM, [place] 10/21/2015, 10:33 AM

## 2015-10-21 NOTE — Progress Notes (Signed)
I provided spiritual support for [name] and her family throughout the day accompanying them to PACU to wait for surgery, arranging a comfort meal cart, assisting with hand molding, and memory making.    [name] is tired from surgery, but appropriately grieving the loss of her daughter.  She has strong support in her family who are also utilizing chaplain's support.  [name]'s husband shared that he had difficulty holding [name] in the delivery room, but feels emotionally ready to try again.  I had some initial conversations with the family about releasing [name] to the funeral home.  They are trying to comply with what they feel are appropriate expectations and I encouraged them to let [name] and her husband take the lead and that the length of time she needs to be able to let [name] go is not indicative of her ability to heal from this tragic event.  Tomorrow, we will begin some conversations about what to expect in that process and how she would like it to go when she is ready.  The family has a memorial service planned for Saturday afternoon.  I passed along appropriate referral information to our night Chaplain, [name] should the family need anything this evening.  Please page as further needs arise.  [name]. Carley Hammed, M.Div. [facility] Chaplain Pager [phone] Office [phone]

## 2015-10-21 NOTE — Plan of Care (Signed)
Problem: Physical Regulation: Goal: Diagnostic test results will improve Outcome: Completed/Met Date Met:  10/21/15 Ordered preop

## 2015-10-21 NOTE — Progress Notes (Addendum)
Inpatient Diabetes Program Recommendations  AACE/ADA: New Consensus Statement on Inpatient Glycemic Control (2015)  Target Ranges:  Prepandial:   less than 140 mg/dL      Peak postprandial:   less than 180 mg/dL (1-2 hours)      Critically ill patients:  140 - 180 mg/dL   Results for [name], [name] (MRN [id number]) as of 10/21/2015 13:35  Ref. Range 10/20/2015 21:59 10/20/2015 23:44 10/21/2015 00:40 10/21/2015 01:43 10/21/2015 02:44 10/21/2015 03:42 10/21/2015 04:41 10/21/2015 05:48 10/21/2015 06:39 10/21/2015 07:44 10/21/2015 08:55 10/21/2015 09:41 10/21/2015 10:49 10/21/2015 11:47  Glucose-Capillary Latest Ref Range: 65-99 mg/dL >956 (HH) 213 (H) 086 (H) 302 (H) 226 (H) 186 (H) 140 (H) 97 94 109 (H) 111 (H) 108 (H) 120 (H) 120 (H)    Admit with: IUFD- Cesarean Delivery  History: Type 1 Diabetes  Home DM Meds: NPH insulin 20 units AM/ 24 units QHS       Novolog 0-8 units tid  Current Insulin Orders: IV Insulin drip per GlucoStabilizer (Diabetic Pregnant Patient order set)      -Note patient currently receiving IV insulin drip today per GlucoStabilizer (started at 11pm last night).  -Per notes, patient was supposed to be seeing Dr. Dorisann Frames for DM management at [facility].  Called Dr. Willeen Cass office and spoke with CMA Aram Beecham.  Per Aram Beecham, patient was last seen by Dr. Talmage Nap on 10/13/15.  Patient was supposed to be taking the following insulin: NPH insulin- 20 units AM/ 18 units QHS + Novolog 10 units tidwc + Novolog 3-7 units tid per SSI (70-100- 3 units; 101-150- 4 units; 151-200- 5 units; 201-250- 6 units; 251-300- 7 units).  -Through Chart Review, note patient was seen by Dr. Carlus Pavlov on 10/24/12 with [facility].  Notes reveal that patient was supposed to be taking Lantus 15 units in the AM and 20 units in the PM along with Novolog 8 units tidwc + Novolog SSI.  Patient was later dismissed by Dr. Charlean Sanfilippo practice on 05/23/13.  Patient then started seeing a  physician with [facility] in October 2014.  Lantus and Novolog were continued.  March 2015, PharmD notes state patient was not seeing physician regularly at [facility] and they would not refill her Lantus for a full year until she came back for follow-up visit with the physician.    MD- Patient will likely have increased insulin sensitivity after delivery of baby.  May want to switch patient back to Lantus and Novolog after delivery today.  Patient will need Lantus insulin 1 hour before d/c of insulin drip and will need to start Novolog after insulin drip stopped.  Recommend the following:  1. Start Lantus 25 units daily (give 1st dose minimum of 1 hour before IV insulin drip stopped)  This would be approximately 0.35 units/kg dosing  2. Start Novolog Sensitive SSI (0-9 units) Q4 hours- Use Glycemic Control Order Set  3. Once patient starts to eat, will likely need Novolog Meal Coverage as well.  When patient begins PO diet, please order Novolog 4 units tid with meals (Use Glycemic Control Order Set)   --Will follow patient during hospitalization--  Ambrose Finland RN, MSN, CDE Diabetes Coordinator Inpatient Glycemic Control Team Team Pager: [phone] (8a-5p)  Addendum 1430: Called RN on Women's unit who will be accepting [name] from the OR.  Advised RN to please call physician managing patient's care for further insulin orders.  Do not suspect MD will want to leave patient  on IV insulin drip all night.  Have left recommendations for Lantus and Novolog above.  Asked RN to please contact MD and direct MD to my recommendations if needed for blood sugar control after delivery.

## 2015-10-21 NOTE — Progress Notes (Signed)
Initial visit with family upon referral from [employer].  Pt has diabetes and found out on Monday that her baby had no heartbeat.  She is here today to deliver by C-Section.  Ohana shared that she was really having difficulty letting go of baby Kirt Boys.  In our conversation, she expressed guilt for not being able to do "the one job [she] had".  She stated that perhaps she's not capable of taking care of her 37 year old daughter Tonna Corner.   She is expressing grief appropriately and allowing family and staff to support her.  Her family pastor has arrived to provide additional support, but did notify me that this is his first experience with the death of a baby.  I offered to help however I can.  He had some questions regarding the family's desire for a dedication.  Please notify me if further questions arise or if the family would like me to assist with this.  Spoke with father of the baby who shared that he was traveling for work (he is a Ambulance person) when Mitzi found out that the baby had died.  It took two days to get home from his travels and he has some concerns about Poppy's reluctance to let go, but is supportive.  Also provided support for Amina's parents and sister.  We discussed their grief over their granddaughter and the difficulty of seeing their daughter in so much pain.  Laquinta's mother shared that they are hoping that God will use Molly's death in a positive way.    I provided emotional and spiritual support to the patient and her family.  I also provided some education and support regarding supporting their older daughter in her grief.  I encouraged the family to use this time to make whatever memories they're able to and shared that I will support her in facilitating these memories.    Will continue to follow.  Please page as further needs arise.  Maryanna Shape. Carley Hammed, M.Div. [facility] Chaplain Pager [phone] Office [phone]      10/21/15 1126  Clinical Encounter  Type  Visited With Patient and family together  Visit Type Initial;Spiritual support;Death  Referral From Nurse  Consult/Referral To Chaplain  Spiritual Encounters  Spiritual Needs Emotional;Grief support  Stress Factors  Patient Stress Factors Loss;Loss of control  Family Stress Factors Loss

## 2015-10-21 NOTE — Progress Notes (Signed)
Per previous order, called to report CBGs less than or equal to 120 throughout afternoon. Patients CBG 239 after consuming clear liquid meal of broth and jello. Spoke with Dr. Charlotta Newton and referred her to diabetes coordinators note and recommendations. Patient to advance to carb modified diet and continue glucose stabilizer throughout night, will re-evaluate in morning.

## 2015-10-21 NOTE — Progress Notes (Signed)
CRITICAL VALUE ALERT  Critical value received:  Glucose 653  Date of notification:  10/20/15  Time of notification:  2218  Critical value read back:Yes.    Nurse who received alert:  J.Breyson Kelm, RN  MD notified (1st page):  Gerrit Heck, CNM  Time of first page & Notification: 2220

## 2015-10-21 NOTE — Op Note (Signed)
Preoperative diagnosis: Intrauterine pregnancy at 34 weeks and 5 days, Type 1 diabetes, pre-eclampsia without severe features, intrauterine fetal demise  Post operative diagnosis: Same  Anesthesia: Spinal  Anesthesiologist: Dr. Jairo Ben  Procedure: Repeat low transverse cesarean section  Surgeon: Dr. Dois Davenport Vitali Seibert  Assistant: Alphonzo Severance CNM  Estimated blood loss: 500 cc  Procedure:  After being informed of the planned procedure and possible complications including bleeding, infection, injury to other organs, informed consent is obtained. The patient is taken to OR #9 and given spinal anesthesia without complication. She is placed in the dorsal decubitus position. She is then prepped and draped in a sterile fashion. A Foley catheter is inserted in her bladder.  After assessing adequate level of anesthesia, we infiltrate the suprapubic area with 20 cc of Marcaine 0.25 and perform a Pfannenstiel incision which is brought down sharply to the fascia. The fascia is entered in a low transverse fashion. Linea alba is dissected. Peritoneum is entered in a midline fashion. An Alexis retractor is easily positioned.   The myometrium is then entered in a low transverse fashion, 2 cm above the vesico-uterine junction ; first with knife and then extended bluntly. Amniotic fluid is meconial. We assist the birth of a deceased female  infant in vertex presentation.The baby is delivered. The cord is clamped and sectioned. The baby is given to the L&D nurse present in the room.  The placenta is allowed to deliver spontaneously. It is complete and the cord has 3 vessels. There is no evidence of abruption or cord accident/knot. Uterine revision is negative.  We proceed with closure of the myometrium in 2 layers: First with a running locked suture of 0 Vicryl, then with a Lembert suture of 0 Vicryl imbricating the first one. Hemostasis is completed with cauterization on peritoneal edges and 3  figure-of-eight stitches at the left angle and midline.  Both paracolic gutters are cleaned. Both tubes and ovaries are assessed and normal. The pelvis is profusely irrigated with warm saline to confirm a satisfactory hemostasis.  Retractors and sponges are removed. Under fascia hemostasis is completed with cauterization. The fascia is then closed with 2 running sutures of 0 Vicryl meeting midline. The wound is irrigated with warm saline and hemostasis is completed with cauterization. The skin is closed with a subcuticular suture of 3-0 Monocryl and Steri-Strips.  Instrument and sponge count is complete x2. Estimated blood loss is 500 cc.  The procedure is well tolerated by the patient who is taken to recovery room in a well and stable condition.  female baby named Kirt Boys was born at 13:10 and received an Apgar of 0  at 1 minute and 0 at 5 minutes with confirmation of in-utero fetal demise.   Specimen: Placenta sent to pathology   [facility] A MD 2/2/20171:52 PM

## 2015-10-21 NOTE — Progress Notes (Signed)
Pt to or with  Transport team

## 2015-10-21 NOTE — Transfer of Care (Signed)
Immediate Anesthesia Transfer of Care Note  Patient: [name]  Procedure(s) Performed: Procedure(s): CESAREAN SECTION (N/A)  Patient Location: PACU  Anesthesia Type:Spinal  Level of Consciousness: awake, alert  and oriented  Airway & Oxygen Therapy: Patient Spontanous Breathing  Post-op Assessment: Report given to RN and Post -op Vital signs reviewed and stable  Post vital signs: Reviewed and stable  Last Vitals:  Filed Vitals:   10/21/15 0549 10/21/15 1000  BP: 114/80 104/77  Pulse: 97 91  Temp: 36.9 C 36.7 C  Resp: 14 16    Complications: No apparent anesthesia complications
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
## 2015-10-21 NOTE — Interval H&P Note (Signed)
History and Physical Interval Note:  10/21/2015 12:12 PM  [name]  has presented today for surgery, with the diagnosis of Chiari malformation,  Previous Cesarean Section IUFD  The various methods of treatment have been discussed with the patient and family. After consideration of risks, benefits and other options for treatment, the patient has consented to  Procedure(s): CESAREAN SECTION (N/A) as a surgical intervention .  The patient's history has been reviewed, patient examined, no change in status, stable for surgery.  I have reviewed the patient's chart and labs.  Questions were answered to the patient's satisfaction.     Sandip Power A

## 2015-10-21 NOTE — Anesthesia Preprocedure Evaluation (Addendum)
Anesthesia Evaluation  Patient identified by MRN, date of birth, ID band Patient awake    Reviewed: Allergy & Precautions, NPO status , Patient's Chart, lab work & pertinent test results  History of Anesthesia Complications Negative for: history of anesthetic complications  Airway Mallampati: II  TM Distance: >3 FB Neck ROM: Full    Dental  (+) Teeth Intact, Dental Advisory Given   Pulmonary neg pulmonary ROS,    breath sounds clear to auscultation       Cardiovascular hypertension (pre-eclampsia),  Rhythm:Regular Rate:Normal     Neuro/Psych  Headaches (rare migraine), Depression H/o Chiari malformation: repaired, released from care, no sequelae    GI/Hepatic Neg liver ROS, GERD  Controlled,  Endo/Other  diabetes (glu 94), Insulin Dependent  Renal/GU negative Renal ROS     Musculoskeletal   Abdominal   Peds  Hematology  (+) Blood dyscrasia (Hb 9.9, plt 257), ,   Anesthesia Other Findings   Reproductive/Obstetrics                          Anesthesia Physical Anesthesia Plan  ASA: III  Anesthesia Plan: Spinal   Post-op Pain Management:    Induction:   Airway Management Planned: Natural Airway and Simple Face Mask  Additional Equipment:   Intra-op Plan:   Post-operative Plan:   Informed Consent: I have reviewed the patients History and Physical, chart, labs and discussed the procedure including the risks, benefits and alternatives for the proposed anesthesia with the patient or authorized representative who has indicated his/her understanding and acceptance.   Dental advisory given  Plan Discussed with: CRNA and Surgeon  Anesthesia Plan Comments: (Plan routine monitors, SAB Type and crossmatch)        Anesthesia Quick Evaluation

## 2015-10-21 NOTE — Anesthesia Procedure Notes (Signed)
Spinal Patient location during procedure: OR Start time: 10/21/2015 12:35 PM End time: 10/21/2015 12:38 PM Staffing Anesthesiologist: Jairo Ben Performed by: anesthesiologist  Preanesthetic Checklist Completed: patient identified, surgical consent, pre-op evaluation, timeout performed, IV checked, risks and benefits discussed and monitors and equipment checked Spinal Block Patient position: sitting Prep: site prepped and draped and DuraPrep Patient monitoring: heart rate, cardiac monitor, continuous pulse ox and blood pressure Approach: midline Location: L3-4 Injection technique: single-shot Needle Needle type: Quincke  Needle gauge: 25 G Needle length: 9 cm Additional Notes Pt identified in Operating room.  Monitors applied. Working IV access confirmed. Sterile prep, drape lumbar spine, 1% lido local L 3,4.  #25 ga Quincke into clear CSF 1st pass.  10.5 mg Bupivacaine with fentanyl and morphine injected with asp beginning and end of injection.  Patient asymptomatic, VSS, no heme aspirated, tolerated well.  Sandford Craze, MD

## 2015-10-21 NOTE — Progress Notes (Signed)
I provided follow-up care while night rounding. [name]'s concern has turned to her daughter, [name]'s, reaction to her sister's death. [name] asks that a chaplain might speak to [name] and assist with her grief. [name] is 35 years old and giving the impression she is not affected by the death of her sister. Consults for [facility] were discussed. Because the family lives in [place], I mentioned the upcoming Grief [employer] by the school system, the chaplains of [address] and [facility]. I suggested the family call [name] for details.   Certainly, follow-up by daytime chaplains is strongly recommended. Ms [name] is aware that chaplain care is available at all times. Please page the chaplain if she or any member of her family needs or required spiritual/emotional care.  Benjie Karvonen. Vernice Mannina, DMin, MDiv, MA Chaplain

## 2015-10-21 NOTE — Anesthesia Postprocedure Evaluation (Signed)
Anesthesia Post Note  Patient: [name]  Procedure(s) Performed: Procedure(s) (LRB): CESAREAN SECTION (N/A)  Patient location during evaluation: PACU Anesthesia Type: Spinal Level of consciousness: awake and alert, oriented and patient cooperative Pain management: pain level controlled Vital Signs Assessment: post-procedure vital signs reviewed and stable Respiratory status: spontaneous breathing, nonlabored ventilation and respiratory function stable Cardiovascular status: blood pressure returned to baseline and stable Postop Assessment: no headache, no backache, spinal receding and patient able to bend at knees Anesthetic complications: no    Last Vitals:  Filed Vitals:   10/21/15 1516 10/21/15 1517  BP:    Pulse: 90 88  Temp:    Resp: 17 24    Last Pain:  Filed Vitals:   10/21/15 1520  PainSc: 0-No pain                 Jefry Lesinski,E. Judye Lorino

## 2015-10-22 ENCOUNTER — Encounter (HOSPITAL_COMMUNITY): Payer: Self-pay | Admitting: Anesthesiology

## 2015-10-22 ENCOUNTER — Encounter (HOSPITAL_COMMUNITY): Payer: Self-pay | Admitting: Obstetrics and Gynecology

## 2015-10-22 LAB — CBC
HEMATOCRIT: 28 % — AB (ref 36.0–46.0)
HEMOGLOBIN: 9.3 g/dL — AB (ref 12.0–15.0)
MCH: 29.3 pg (ref 26.0–34.0)
MCHC: 33.2 g/dL (ref 30.0–36.0)
MCV: 88.3 fL (ref 78.0–100.0)
Platelets: 205 10*3/uL (ref 150–400)
RBC: 3.17 MIL/uL — ABNORMAL LOW (ref 3.87–5.11)
RDW: 14.4 % (ref 11.5–15.5)
WBC: 10.2 10*3/uL (ref 4.0–10.5)

## 2015-10-22 LAB — GLUCOSE, CAPILLARY
GLUCOSE-CAPILLARY: 115 mg/dL — AB (ref 65–99)
GLUCOSE-CAPILLARY: 128 mg/dL — AB (ref 65–99)
GLUCOSE-CAPILLARY: 128 mg/dL — AB (ref 65–99)
GLUCOSE-CAPILLARY: 143 mg/dL — AB (ref 65–99)
GLUCOSE-CAPILLARY: 88 mg/dL (ref 65–99)
Glucose-Capillary: 101 mg/dL — ABNORMAL HIGH (ref 65–99)
Glucose-Capillary: 101 mg/dL — ABNORMAL HIGH (ref 65–99)
Glucose-Capillary: 112 mg/dL — ABNORMAL HIGH (ref 65–99)
Glucose-Capillary: 112 mg/dL — ABNORMAL HIGH (ref 65–99)
Glucose-Capillary: 131 mg/dL — ABNORMAL HIGH (ref 65–99)
Glucose-Capillary: 171 mg/dL — ABNORMAL HIGH (ref 65–99)
Glucose-Capillary: 179 mg/dL — ABNORMAL HIGH (ref 65–99)
Glucose-Capillary: 197 mg/dL — ABNORMAL HIGH (ref 65–99)
Glucose-Capillary: 278 mg/dL — ABNORMAL HIGH (ref 65–99)
Glucose-Capillary: 42 mg/dL — CL (ref 65–99)
Glucose-Capillary: 67 mg/dL (ref 65–99)
Glucose-Capillary: 73 mg/dL (ref 65–99)

## 2015-10-22 LAB — HEMOGLOBIN A1C
Hgb A1c MFr Bld: 11.5 % — ABNORMAL HIGH (ref 4.8–5.6)
Mean Plasma Glucose: 283 mg/dL

## 2015-10-22 LAB — RPR: RPR: NONREACTIVE

## 2015-10-22 MED ORDER — PNEUMOCOCCAL VAC POLYVALENT 25 MCG/0.5ML IJ INJ
0.5000 mL | INJECTION | INTRAMUSCULAR | Status: DC
  Filled 2015-10-22: qty 0.5

## 2015-10-22 MED ORDER — INSULIN ASPART 100 UNIT/ML ~~LOC~~ SOLN
0.0000 [IU] | Freq: Three times a day (TID) | SUBCUTANEOUS | Status: DC
  Administered 2015-10-22: 5 [IU] via SUBCUTANEOUS
  Administered 2015-10-23: 3 [IU] via SUBCUTANEOUS
  Administered 2015-10-23 (×2): 2 [IU] via SUBCUTANEOUS
  Administered 2015-10-24: 5 [IU] via SUBCUTANEOUS
  Administered 2015-10-24 – 2015-10-25 (×2): 2 [IU] via SUBCUTANEOUS
  Administered 2015-10-25: 3 [IU] via SUBCUTANEOUS

## 2015-10-22 MED ORDER — INSULIN GLARGINE 100 UNIT/ML ~~LOC~~ SOLN
20.0000 [IU] | Freq: Every day | SUBCUTANEOUS | Status: DC
  Administered 2015-10-22 – 2015-10-25 (×4): 20 [IU] via SUBCUTANEOUS
  Filled 2015-10-22 (×6): qty 0.2

## 2015-10-22 MED ORDER — DEXTROSE 50 % IV SOLN
INTRAVENOUS | Status: AC
  Administered 2015-10-22: 23 mL
  Filled 2015-10-22: qty 50

## 2015-10-22 MED ORDER — INSULIN ASPART 100 UNIT/ML ~~LOC~~ SOLN
3.0000 [IU] | Freq: Three times a day (TID) | SUBCUTANEOUS | Status: DC
  Administered 2015-10-22 – 2015-10-25 (×10): 3 [IU] via SUBCUTANEOUS

## 2015-10-22 MED ORDER — INSULIN ASPART 100 UNIT/ML ~~LOC~~ SOLN
0.0000 [IU] | Freq: Every day | SUBCUTANEOUS | Status: DC
  Administered 2015-10-23: 2 [IU] via SUBCUTANEOUS

## 2015-10-22 NOTE — Progress Notes (Signed)
CSW received consult request due to IUFD.   CSW has collaborated with [employer] and [employer]. Per chaplains, the patient is appropriately grieving and processing her loss.    Per RN, the patient has informed her medical providers of a desire to receive counseling.  Chaplains reported that they will review outpatient follow up in regards to grief, loss, and mental health needs due to the patient presenting with a history of depression.  RN denied any awareness of acute mental health needs at this time, and agreed with Chaplains assessments of the patient appropriately grieving.  Since the patient has established rapport with the chaplains, and no acute mental needs have been identified, CSW screening out consult at this time.  Chaplains to notify CSW if additional needs are identified, and will ensure appropriate counseling/therapy follow.

## 2015-10-22 NOTE — Progress Notes (Signed)
Results for CLAUDENE, GATLIFF (MRN [id number]) as of 10/22/2015 09:00  Ref. Range 10/22/2015 08:08 10/22/2015 08:31  Glucose-Capillary Latest Ref Range: 65-99 mg/dL 42 (LL) 829 (H)    Phoned above hypoglycemic incident to Dr. Normand Sloop. Patient treated with 23ml D50 per glucostabilizer instructions. In addition,  patient ate breakfast.  CBG up to 101 after treatment. Dr. Normand Sloop and this RN have just spoken to Diabetes Coordinator regarding orders for transitioning patient off IV insulin to SQ insulin.

## 2015-10-22 NOTE — Addendum Note (Signed)
Addendum  created 10/22/15 2956 by Janeece Agee, CRNA   Modules edited: Clinical Notes   Clinical Notes:  File: [id number]

## 2015-10-22 NOTE — Progress Notes (Signed)
This was a follow up visit with [name], who I met yesterday before and after delivering baby [name].  I spent a significant amount of time with her this afternoon.  The primary focus was around letting baby [name] go today when she is discharged to the funeral home.  I supported her as she processed her feelings around letting her go-knowing it  Will be painful and that it has to come eventually.  The baby's funeral is tomorrow and it was supposed to be her baby shower.  We also discussed her family's different reactions to [facility] death and the ways different people process and demonstrate their feelings.  She has significant support in her family and plans to work with Hospice for grief counseling when she feels ready>  She is familiar with [facility] and has a good friend who has worked with them.  I also provided her with business cards for [employer] and myself and encouraged her to follow up with [place] as she needs.    In addition, I helped her to make additional prints of [name]'s hands and feet to give to her grandmothers.  Please page as further needs arise.  Donald Prose. Elyn Peers, M.Div. [facility] Chaplain Pager [phone] Office [phone]

## 2015-10-22 NOTE — Anesthesia Postprocedure Evaluation (Signed)
Anesthesia Post Note  Patient: [name]  Procedure(s) Performed: Procedure(s) (LRB): CESAREAN SECTION (N/A)  Patient location during evaluation: [facility] Anesthesia Type: Spinal Level of consciousness: awake Pain management: pain level controlled Vital Signs Assessment: post-procedure vital signs reviewed and stable Respiratory status: spontaneous breathing Cardiovascular status: blood pressure returned to baseline and stable Postop Assessment: no headache, no backache, patient able to bend at knees, no signs of nausea or vomiting and adequate PO intake Anesthetic complications: no    Last Vitals:  Filed Vitals:   10/22/15 0154 10/22/15 0507  BP: 100/72 109/76  Pulse: 92 89  Temp: 37.3 C 36.3 C  Resp: 14 14    Last Pain:  Filed Vitals:   10/22/15 0609  PainSc: Asleep                 [name]

## 2015-10-22 NOTE — Progress Notes (Signed)
Hypoglycemic Event  CBG: 58 at 2322  Treatment: D50 IV 50 mL  Symptoms: Shaky  Follow-up CBG: Time:2351 CBG Result:88  Possible Reasons for Event: Medication regimen: Glucostabilizer with insulin drip  Comments/MD notified:Dr. Ozan @ 0030; no new orders    Leda Min

## 2015-10-22 NOTE — Progress Notes (Addendum)
Inpatient Diabetes Program Recommendations  AACE/ADA: New Consensus Statement on Inpatient Glycemic Control (2015)  Target Ranges:  Prepandial:   less than 140 mg/dL      Peak postprandial:   less than 180 mg/dL (1-2 hours)      Critically ill patients:  140 - 180 mg/dL   Results for [name], [name] (MRN [id number]) as of 10/22/2015 08:50  Ref. Range 10/21/2015 23:22 10/21/2015 23:51 10/22/2015 00:56 10/22/2015 01:57 10/22/2015 03:01 10/22/2015 03:56 10/22/2015 05:02 10/22/2015 06:06 10/22/2015 07:03 10/22/2015 08:08 10/22/2015 08:31  Glucose-Capillary Latest Ref Range: 65-99 mg/dL 58 (L) 88 67 329 (H) 518 (H) 73 112 (H) 128 (H) 128 (H) 42 (LL) 101 (H)   Admit with: IUFD- Cesarean Delivery  History: Type 1 Diabetes  Home DM Meds: NPH insulin 20 units AM/ 24 units QHS (for pregnancy)  Novolog 0-8 units tid  Current Insulin Orders: IV Insulin drip per GlucoStabilizer (Diabetic Pregnant Patient order set)      -Note patient currently receiving IV insulin drip today per GlucoStabilizer (started 02/01 at 11pm).  -Per notes, patient was seeing Dr. Dorisann Frames for DM management at [facility]. Called Dr. Willeen Cass office and spoke with CMA Aram Beecham. Per Aram Beecham, patient was last seen by Dr. Talmage Nap on 10/13/15. Patient was supposed to be taking the following insulin: NPH insulin- 20 units AM/ 18 units QHS + Novolog 10 units tidwc + Novolog 3-7 units tid per SSI (70-100- 3 units; 101-150- 4 units; 151-200- 5 units; 201-250- 6 units; 251-300- 7 units).  -Through Chart Review, note patient was seen by Dr. Carlus Pavlov on 10/24/12 with [facility]. Notes reveal that patient was supposed to be taking Lantus 15 units in the AM and 20 units in the PM along with Novolog 8 units tidwc + Novolog SSI. Patient was later dismissed by Dr. Charlean Sanfilippo practice on 05/23/13. Patient then started seeing a physician with [facility] in October  2014. Lantus and Novolog were continued. March 2015, PharmD notes state patient was not seeing physician regularly at [facility] and they would not refill her Lantus for a full year until she came back for follow-up visit with the physician.      Spoke with Dr. Normand Sloop this AM by phone.  Discussed with MD about transitioning patient back to SQ insulin regimen and stopping IV insulin drip.  Based on patient's weight, prior history of insulin use, and insulin sensitivity, recommend we switch patient to Lantus and Novolog.  Recommend Lantus 20 units daily, Novolog Sensitive SSI (0-9 units) TID AC + HS, and Novolog 3 units tidwc.  Dr. Normand Sloop gave me permission to enter these orders into [facility].  Dr. Ancil Boozer had questions about who patient will follow up with.  Called RN and discussed new orders with RN.  Spoke with patient this AM by phone.  Patient told me she was seeing Dr. Talmage Nap for DM management.  Per patient, Dr. Talmage Nap switched patient to NPH insulin for pregnancy.  Patient stated she has taken Lantus and Novolog in the past.  Patient told me she has an appointment with Dr. Talmage Nap on Monday (02/06), however, patient has asked her mother to change the appointment to a different date.  Encouraged patient to try to follow-up with Dr. Talmage Nap within the next two weeks after d/c for further insulin adjustments.  Also discussed with patient that I called Dr. Normand Sloop with [facility] and received orders to transition patient back to SQ insulin regimen.  Explained to patient  that we will transition her to Lantus and Novolog.  She was agreeable with this plan.   MD- When patient is ready to d/c home, please make sure to give her prescriptions for the following insulin.  Patient is planning to follow up with Dr. Talmage Nap (with [facility]) for her DM after discharge.  1. Lantus insulin pen- Order [id number]  2. Novolog insulin pen- Order [id number]  3. Insulin Pen  needles- Order [id number]    --Will follow patient during hospitalization--  Ambrose Finland RN, MSN, CDE Diabetes Coordinator Inpatient Glycemic Control Team Team Pager: [phone] (8a-5p)

## 2015-10-22 NOTE — Progress Notes (Signed)
I spent significant time with [name] and her family (mother-in-law and friend) as they continued to process their loss.  I provided grief education, particularly about how to care for grieving children, and grief support.  They have good support, from family, friends, their daughter's school community and their faith community.  [name] stated that she did not want to be alone and that she has never had thoughts like this before.  I asked her specifically about what types of thoughts and whether or not she had any thoughts about harming herself.  She stated that she did not have thoughts like that, but that she had never not wanted to be alone like this.  I affirmed the support of her community and affirmed her ability to state her needs at this time.    She feels significant guilt.  She mentioned specifically that she felt guilty because she was responsible for making sure baby Kirt Boys got here okay.  I stated that responsibility does not equate to guilt.  I also encouraged her to speak to her physicians with any particular questions, but encouraged her to think about her love for her baby and how she would have done whatever it would take to save her baby if she had known what this outcome would be.    She is aware that she can call us for follow up support at any time.  Chaplain Dyanne Carrel, Bcc Pager, [phone] 2:39 PM    10/22/15 1400  Clinical Encounter Type  Visited With Family  Visit Type Spiritual support  Referral From Chaplain;Nurse  Spiritual Encounters  Spiritual Needs Grief support

## 2015-10-23 ENCOUNTER — Encounter (HOSPITAL_COMMUNITY): Payer: Self-pay | Admitting: Anesthesiology

## 2015-10-23 DIAGNOSIS — Z98891 History of uterine scar from previous surgery: Secondary | ICD-10-CM

## 2015-10-23 LAB — CBC
HEMATOCRIT: 30.7 % — AB (ref 36.0–46.0)
HEMOGLOBIN: 10.1 g/dL — AB (ref 12.0–15.0)
MCH: 29.3 pg (ref 26.0–34.0)
MCHC: 32.9 g/dL (ref 30.0–36.0)
MCV: 89 fL (ref 78.0–100.0)
PLATELETS: 317 10*3/uL (ref 150–400)
RBC: 3.45 MIL/uL — AB (ref 3.87–5.11)
RDW: 14.7 % (ref 11.5–15.5)
WBC: 7.9 10*3/uL (ref 4.0–10.5)

## 2015-10-23 LAB — COMPREHENSIVE METABOLIC PANEL
ALT: 18 U/L (ref 14–54)
ANION GAP: 10 (ref 5–15)
AST: 21 U/L (ref 15–41)
Albumin: 2.8 g/dL — ABNORMAL LOW (ref 3.5–5.0)
Alkaline Phosphatase: 117 U/L (ref 38–126)
BILIRUBIN TOTAL: 0.2 mg/dL — AB (ref 0.3–1.2)
BUN: 10 mg/dL (ref 6–20)
CHLORIDE: 103 mmol/L (ref 101–111)
CO2: 23 mmol/L (ref 22–32)
Calcium: 8.3 mg/dL — ABNORMAL LOW (ref 8.9–10.3)
Creatinine, Ser: 0.47 mg/dL (ref 0.44–1.00)
GFR calc Af Amer: >60 mL/min (ref >60)
GFR calc non Af Amer: >60 mL/min (ref >60)
GLUCOSE: 264 mg/dL — AB (ref 65–99)
POTASSIUM: 4 mmol/L (ref 3.5–5.1)
Sodium: 136 mmol/L (ref 135–145)
TOTAL PROTEIN: 6.6 g/dL (ref 6.5–8.1)

## 2015-10-23 LAB — GLUCOSE, CAPILLARY
GLUCOSE-CAPILLARY: 235 mg/dL — AB (ref 65–99)
Glucose-Capillary: 199 mg/dL — ABNORMAL HIGH (ref 65–99)
Glucose-Capillary: 192 mg/dL — ABNORMAL HIGH (ref 65–99)

## 2015-10-23 LAB — LACTATE DEHYDROGENASE: LDH: 161 U/L (ref 98–192)

## 2015-10-23 LAB — LUPUS ANTICOAGULANT PANEL
DRVVT: 34.1 s (ref 0.0–44.0)
PTT Lupus Anticoagulant: 31.9 s (ref 0.0–40.6)

## 2015-10-23 LAB — URIC ACID: Uric Acid, Serum: 3.5 mg/dL (ref 2.3–6.6)

## 2015-10-23 MED ORDER — HYDRALAZINE HCL 20 MG/ML IJ SOLN: 10.0000 mg | Freq: Once | INTRAMUSCULAR | Status: DC | PRN

## 2015-10-23 MED ORDER — LABETALOL HCL 5 MG/ML IV SOLN: 20.0000 mg | INTRAVENOUS | Status: DC | PRN

## 2015-10-23 MED ORDER — MAGNESIUM SULFATE 50 % IJ SOLN
2.0000 g/h | INTRAVENOUS | Status: AC
  Administered 2015-10-23: 2 g/h via INTRAVENOUS
  Filled 2015-10-23 (×2): qty 80

## 2015-10-23 MED ORDER — MAGNESIUM SULFATE BOLUS VIA INFUSION
4.0000 g | Freq: Once | INTRAVENOUS | Status: AC
  Administered 2015-10-23: 4 g via INTRAVENOUS
  Filled 2015-10-23: qty 500

## 2015-10-23 NOTE — Lactation Note (Signed)
Lactation Consultation Note  Patient Name: [name] [id number] Date: 10/23/2015   Asked to see mom who delivered a fetal demise on 10/21/15. Mom is in Antenatal being treated for hypertension on MgSO4. Mom is severely engorged and breast are reddened. Mom reports she is very uncomfortable. Discussed with mom that we can ice and apply cabbage and wait it out or can express a small amount to provide some comfort. Mom wanted to use a DEBP and try to pump a little milk off. Ice packs applied for 10 minutes then DEBP applied. Mom pumped about 5 cc from each side. Mom does not wish to pump long term and asked if milk could be donated to NICU. Told her it cannot be directly donated that it has to be processed. Advised her it is her choice if she wishes to dump milk or put in fridge and we can explore donor programs tomorrow. Discussed with mom's nurse, [name]. Advised Motrin may be helpful if mom able to take it, nurse to follow through on Motrin. Mom says she just wants the milk gone. Mom is aware that emptying the breasts does cause more milk production.  Discussed severe unresolved engorgement generally causes involution which allows milk to dry up. Enc mom to use lots of ice and Cabbage leaves to breasts to encourage milk to dry up. Mom voiced understanding. Will need f/u tomorrow.     Maternal Data    Feeding    LATCH Score/Interventions                      Lactation Tools Discussed/Used     Consult Status      Ed Blalock 10/23/2015, 11:37 PM

## 2015-10-23 NOTE — Progress Notes (Signed)
Post Partum Day 2 S/P CS FOR IUFD Subjective: no complaints, voiding, tolerating PO and pt also with headache on and off. no blurred vision or RUQ pain  Objective: Blood pressure 162/94, pulse 108, temperature 98.4 F (36.9 C), temperature source Oral, resp. rate 18, height  (1.626 m), weight 156 lb (70.761 kg), last menstrual period 02/03/2015, SpO2 100 %.  Physical Exam:  General: alert and cooperative  CV RRR Lungs CTAB Lochia: appropriate Uterine Fundus: firm Incision: healing well, no significant drainage DVT Evaluation: No evidence of DVT seen on physical exam.   Recent Labs  10/22/15 0503 10/23/15 1243  HGB 9.3* 10.1*  HCT 28.0* 30.7*    Assessment/PlaP POD #2 cs for IUFD Pt c/o headaches, usually relieved with tylenol Will check PIH labs.  Pt was diagnosed with preeclampsia antepartum.  Will plan to use magnesium today Pt is going to the funeral home for the funeral of the baby.  When she returns we will start the magnesium.  She understands that her blood pressure is elevated and that there is risk for seizure No HI or SI.  Still waiting for SW.   BS still slightly elevated.  Will monitor   LOS: 3 days   Rein Popov A 10/23/2015, 1:55 PM

## 2015-10-24 LAB — GLUCOSE, CAPILLARY
GLUCOSE-CAPILLARY: 63 mg/dL — AB (ref 65–99)
GLUCOSE-CAPILLARY: 166 mg/dL — AB (ref 65–99)
GLUCOSE-CAPILLARY: 103 mg/dL — AB (ref 65–99)
GLUCOSE-CAPILLARY: 57 mg/dL — AB (ref 65–99)
GLUCOSE-CAPILLARY: 74 mg/dL (ref 65–99)
Glucose-Capillary: 61 mg/dL — ABNORMAL LOW (ref 65–99)
Glucose-Capillary: 64 mg/dL — ABNORMAL LOW (ref 65–99)
Glucose-Capillary: 161 mg/dL — ABNORMAL HIGH (ref 65–99)
Glucose-Capillary: 252 mg/dL — ABNORMAL HIGH (ref 65–99)

## 2015-10-24 LAB — COMPREHENSIVE METABOLIC PANEL
ALT: 25 U/L (ref 14–54)
ANION GAP: 7 (ref 5–15)
AST: 23 U/L (ref 15–41)
Albumin: 3 g/dL — ABNORMAL LOW (ref 3.5–5.0)
Alkaline Phosphatase: 124 U/L (ref 38–126)
BUN: 9 mg/dL (ref 6–20)
CHLORIDE: 101 mmol/L (ref 101–111)
CO2: 27 mmol/L (ref 22–32)
CREATININE: 0.4 mg/dL — AB (ref 0.44–1.00)
Calcium: 7.9 mg/dL — ABNORMAL LOW (ref 8.9–10.3)
Glucose, Bld: 75 mg/dL (ref 65–99)
POTASSIUM: 3.8 mmol/L (ref 3.5–5.1)
SODIUM: 135 mmol/L (ref 135–145)
Total Bilirubin: 0.3 mg/dL (ref 0.3–1.2)
Total Protein: 6.9 g/dL (ref 6.5–8.1)

## 2015-10-24 LAB — TYPE AND SCREEN
ABO/RH(D): B POS
Antibody Screen: NEGATIVE
UNIT DIVISION: 0
UNIT DIVISION: 0

## 2015-10-24 LAB — CBC
HEMATOCRIT: 31.7 % — AB (ref 36.0–46.0)
HEMOGLOBIN: 10.3 g/dL — AB (ref 12.0–15.0)
MCH: 29 pg (ref 26.0–34.0)
MCHC: 32.5 g/dL (ref 30.0–36.0)
MCV: 89.3 fL (ref 78.0–100.0)
PLATELETS: 343 10*3/uL (ref 150–400)
RBC: 3.55 MIL/uL — AB (ref 3.87–5.11)
RDW: 14.5 % (ref 11.5–15.5)
WBC: 8.9 10*3/uL (ref 4.0–10.5)

## 2015-10-24 LAB — MAGNESIUM: Magnesium: 4.4 mg/dL — ABNORMAL HIGH (ref 1.7–2.4)

## 2015-10-24 MED ORDER — BUTALBITAL-APAP-CAFFEINE 50-325-40 MG PO TABS
2.0000 | ORAL_TABLET | Freq: Four times a day (QID) | ORAL | Status: DC | PRN
  Administered 2015-10-24: 2 via ORAL
  Filled 2015-10-24: qty 2

## 2015-10-24 MED ORDER — LACTATED RINGERS IV SOLN
INTRAVENOUS | Status: DC
  Administered 2015-10-24: 08:00:00 via INTRAVENOUS

## 2015-10-24 NOTE — Progress Notes (Signed)
Subjective: Postpartum Day 3: Cesarean Delivery Patient is sleeping comfortably.  I did not wake her.    Objective: Vital signs in last 24 hours: Temp:  [98.2 F (36.8 C)-98.7 F (37.1 C)] 98.3 F (36.8 C) (02/05 1200) Pulse Rate:  [91-102] 91 (02/05 1200) Resp:  [16-20] 18 (02/05 1200) BP: (94-152)/(61-106) 125/79 mmHg (02/05 1200) SpO2:  [98 %-100 %] 99 % (02/05 1200)  Physical Exam:  deferred   Recent Labs  10/23/15 1243 10/24/15 0520  HGB 10.1* 10.3*  HCT 30.7* 31.7*    Assessment/Plan: Status post Cesarean section.  Course complicated by poorly controlled DM and Preeclampsia.  Now on mg.  Will cont for 24hrs and continut to observe.    Continue current care. Labs wnl  [name] Y 10/24/2015, 2:07 PM

## 2015-10-24 NOTE — Progress Notes (Signed)
Hypoglycemic Event  CBG: 57 mg/dl  Treatment: Crackers 16X, Peanut Butter 3g, Skim Milk  8 oz  Symptoms:  No signs or symptoms of hypoglycemia noted or reported. Patient awake, alert and oriented x 3.  Follow-up CBG: Time: 2241CBG Result:74mg /dl  Possible Reasons for Event:  Pt reported that she did not eat her dinner because she did not like it, and that she did not know she could have ordered another tray. Patient was educated about ordering foods she can eat and was also instructed to inform her nurse about any issue she may encounter such as getting meals she dose'nt like.  Comments/MD notified:Jessica [employer], Griffin Dakin

## 2015-10-24 NOTE — Consult Note (Signed)
Mom is still engorged today but reports breasts are not as hard/firm as before. She denies discomfort with breast fullness. Mom has not been using ice/cabbage leaves consistently today but does have cabbage leaves on now. Mom had questions about donating EBM with what she pumped last night but does not want to continue to pump to donate milk. She reported to [facility] that she wants milk to dry up. Advised Mom to follow this routine: Ice packs for 30 minutes - new ice packs given at this visit. Then apply cabbage leaves for 2 hours. Then re-apply ice packs. Follow this alternating routine till engorgement resolving. Pump if breasts become red or uncomfortable, but only pump to comfort, do not empty breasts. Advised good supportive bra for next few days.  Call for assist as needed.

## 2015-10-24 NOTE — Progress Notes (Signed)
At 0430 AM CBG finger stick resulted 61 mg/dl. Pt was alert and oriented x 3. Pt  was fed crackers, cheese, milk and apple juice. On rechecking CBG finger stick it resulted 161 mg/dl. We will continue to monitor CBG as ordered.

## 2015-10-24 NOTE — Progress Notes (Signed)
Inpatient Diabetes Program Recommendations  AACE/ADA: New Consensus Statement on Inpatient Glycemic Control (2015)  Target Ranges:  Prepandial:   less than 140 mg/dL      Peak postprandial:   less than 180 mg/dL (1-2 hours)      Critically ill patients:  140 - 180 mg/dL   Results for [name], [name] (MRN [id number]) as of 10/24/2015 09:02  Ref. Range 10/23/2015 07:55 10/23/2015 11:49 10/23/2015 22:18  Glucose-Capillary Latest Ref Range: 65-99 mg/dL 409 (H) 811 (H) 914 (H)    Results for [name], [name] (MRN [id number]) as of 10/24/2015 09:02  Ref. Range 10/24/2015 04:31 10/24/2015 05:03 10/24/2015 05:05 10/24/2015 06:02 10/24/2015 07:48  Glucose-Capillary Latest Ref Range: 65-99 mg/dL 61 (L) 64 (L) 63 (L) 086 (H) 252 (H)    Admit with: IUFD- Cesarean Delivery  History: Type 1 Diabetes  Home DM Meds: NPH insulin 20 units AM/ 24 units QHS (for pregnancy)  Novolog 0-8 units tid  Current Insulin Orders: Lantus 20 units daily      Novolog Sensitive SSI (0-9 units) TID AC + HS      Novolog 3 units tidwc     -Note patient left the hospital yesterday for her infant's funeral.  Returned last evening.  -Patient with Hypoglycemia this morning b/c she received too much Novolog insulin last night at bedtime.  Per records, patient should only have received a total of 5 units Novolog (2 units Novolog SSI for CBG of 235 mg/dl and 3 units Novolog Meal Coverage to cover the dinner she ate at bedtime).  Per records, patient received a total of 8 units Novolog (patient received both doses of Novolog SSI- bedtime scale that was due and the dose that was due at 5pm + the Novolog meal coverage).  Patient should have only received the bedtime dose of SSI that was due plus the Meal Coverage dose to cover her food intake.  -Do not recommend any insulin adjustments at this time.  Would like to see pt's CBG trends for today before making further recommendations.  -Patient will need Rxs  for Novolog and Lantus insulin pens at time of d/c.  Please see Physician Sticky Notes for Order Numbers for these items.      --Will follow patient during hospitalization--  Ambrose Finland RN, MSN, CDE Diabetes Coordinator Inpatient Glycemic Control Team Team Pager: [phone] (8a-5p)

## 2015-10-25 LAB — GLUCOSE, CAPILLARY
GLUCOSE-CAPILLARY: 157 mg/dL — AB (ref 65–99)
GLUCOSE-CAPILLARY: 151 mg/dL — AB (ref 65–99)
GLUCOSE-CAPILLARY: 163 mg/dL — AB (ref 65–99)
Glucose-Capillary: 248 mg/dL — ABNORMAL HIGH (ref 65–99)

## 2015-10-25 LAB — ANTINUCLEAR ANTIBODIES, IFA: ANTINUCLEAR ANTIBODIES, IFA: NEGATIVE

## 2015-10-25 MED ORDER — IBUPROFEN 600 MG PO TABS: 600.0000 mg | ORAL_TABLET | Freq: Four times a day (QID) | ORAL | Status: DC

## 2015-10-25 MED ORDER — BUTALBITAL-APAP-CAFFEINE 50-325-40 MG PO TABS: 2.0000 | ORAL_TABLET | Freq: Four times a day (QID) | ORAL | Status: DC | PRN

## 2015-10-25 MED ORDER — ALPRAZOLAM 0.25 MG PO TABS: 0.2500 mg | ORAL_TABLET | Freq: Three times a day (TID) | ORAL | Status: DC | PRN

## 2015-10-25 NOTE — Discharge Summary (Addendum)
  Obstetric Discharge Summary Reason for Admission: Intrauterine Fetal demise (IUFD) at about [redacted] weeks EGA.   Poorly controlled type I diabetic on insulin Preeclampsia with severe features  Intrapartum Procedures: cesarean: low cervical, transverse, repeat  Postpartum Procedures: Magnesium sulfate for seizure prophylaxis   Hospital course:  Patient presented with contractions and was known to have IUFD. She desired delivery by repeat cesarean section.  This was performed without any complications please see the op note for more details. Postoperatively she was noted to meet criteria for preeclampsia with severe features and she received a postpartum magnesium sulfate. She also received Lexapro for depression and Xanax to help calm her down. She did have our chaplain visit and also social work consults. She had management of her diabetes with insulin. She was deemed stable for discharge on postoperative day 4.  Complications-Operative and Postpartum: none HEMOGLOBIN  Date Value Ref Range Status  10/24/2015 10.3* 12.0 - 15.0 g/dL Final   HCT  Date Value Ref Range Status  10/24/2015 31.7* 36.0 - 46.0 % Final   Recent Glucose values: 10/24/15: 161, 252, 166, 103, 57, 248, 160.   Physical Exam:  General: alert, cooperative and moderate distress Lochia: appropriate Uterine Fundus: firm Incision: healing well DVT Evaluation: No evidence of DVT seen on physical exam. No cords or calf tenderness. No significant calf/ankle edema.  Discharge Diagnoses: Intrauterine fetal demise.  Poorly controlled type I diabetic on insulin Preeclampsia with severe features   Discharge Information: Date: 10/25/2015 Activity: pelvic rest Diet: Diabetic diet Medications: Fioricet, ibuprofen, ferrous sulfate, lexapro, xanax Condition: stable Instructions: refer to practice specific booklet Discharge to: home Follow-up Information    Follow up with [facility] (Endocrine). Go on 10/26/2015.   Why:  @ 1200pm   Contact information:   [phone]      Follow up with [facility] A, MD In 1 week.   Specialty:  Obstetrics and Gynecology   Why:  For wound re-check, BP check   Contact information:   [address] [place] [address] [phone]       Newborn Data: Live born female  Birth Weight: 3 lb 9.7 oz (1636 g) APGAR: 0, 0.   Funeral for baby was done before patient was discharged from hospital.   [facility] 10/25/2015, 6:03 PM

## 2015-10-25 NOTE — Lactation Note (Signed)
Lactation Consultation Note  Patient Name: [name] ONGEX'B Date: 10/25/2015   Follow up with mom . She remains in AICU today. She reports she is still engorged but not quite as tight. She is using ice and cabbage leaves and wearing a tight bra. Breasts are still firm and slightly reddened. Mom is hoping to be d/c today. Advised her to call prn questions/concerns.     Maternal Data    Feeding    LATCH Score/Interventions                      Lactation Tools Discussed/Used     Consult Status      Silas Flood Brinleigh Tew 10/25/2015, 11:11 AM

## 2015-10-25 NOTE — Discharge Instructions (Signed)
[name], 1.  Please take your medication as prescribed.  Call if with worsening symptoms despite medication use.  2. Please follow up at [facility] office in one week for incision check and BP check.  3.  Follow-up with your endocrinologist as scheduled tomorrow. 4. Nothing in vagina for the next 6 weeks 5. Please call us  at the office if we can help with anything else.   Preeclampsia and Eclampsia Preeclampsia is a serious condition that develops only during pregnancy. It is also called toxemia of pregnancy. This condition causes high blood pressure along with other symptoms, such as swelling and headaches. These may develop as the condition gets worse. Preeclampsia may occur 20 weeks or later into your pregnancy.  Diagnosing and treating preeclampsia early is very important. If not treated early, it can cause serious problems for you and your baby. One problem it can lead to is eclampsia, which is a condition that causes muscle jerking or shaking (convulsions) in the mother. Delivering your baby is the best treatment for preeclampsia or eclampsia.  RISK FACTORS The cause of preeclampsia is not known. You may be more likely to develop preeclampsia if you have certain risk factors. These include:   Being pregnant for the first time.  Having preeclampsia in a past pregnancy.  Having a family history of preeclampsia.  Having high blood pressure.  Being pregnant with twins or triplets.  Being 25 or older.  Being African American.  Having kidney disease or diabetes.  Having medical conditions such as lupus or blood diseases.  Being very overweight (obese). SIGNS AND SYMPTOMS  The earliest signs of preeclampsia are:  High blood pressure.  Increased protein in your urine. Your health care provider will check for this at every prenatal visit. Other symptoms that can develop include:   Severe headaches.  Sudden weight gain.  Swelling of your hands, face, legs, and  feet.  Feeling sick to your stomach (nauseous) and throwing up (vomiting).  Vision problems (blurred or double vision).  Numbness in your face, arms, legs, and feet.  Dizziness.  Slurred speech.  Sensitivity to bright lights.  Abdominal pain. DIAGNOSIS  There are no screening tests for preeclampsia. Your health care provider will ask you about symptoms and check for signs of preeclampsia during your prenatal visits. You may also have tests, including:  Urine testing.  Blood testing.  Checking your baby's heart rate.  Checking the health of your baby and your placenta using images created with sound waves (ultrasound). TREATMENT  You can work out the best treatment approach together with your health care provider. It is very important to keep all prenatal appointments. If you have an increased risk of preeclampsia, you may need more frequent prenatal exams.  Your health care provider may prescribe bed rest.  You may have to eat as little salt as possible.  You may need to take medicine to lower your blood pressure if the condition does not respond to more conservative measures.  You may need to stay in the hospital if your condition is severe. There, treatment will focus on controlling your blood pressure and fluid retention. You may also need to take medicine to prevent seizures.  If the condition gets worse, your baby may need to be delivered early to protect you and the baby. You may have your labor started with medicine (be induced), or you may have a cesarean delivery.  Preeclampsia usually goes away after the baby is born. HOME CARE INSTRUCTIONS  Only take over-the-counter or prescription medicines as directed by your health care provider.  Lie on your left side while resting. This keeps pressure off your baby.  Elevate your feet while resting.  Get regular exercise. Ask your health care provider what type of exercise is safe for you.  Avoid caffeine and  alcohol.  Do not smoke.  Drink 6-8 glasses of water every day.  Eat a balanced diet that is low in salt. Do not add salt to your food.  Avoid stressful situations as much as possible.  Get plenty of rest and sleep.  Keep all prenatal appointments and tests as scheduled. SEEK MEDICAL CARE IF:  You are gaining more weight than expected.  You have any headaches, abdominal pain, or nausea.  You are bruising more than usual.  You feel dizzy or light-headed. SEEK IMMEDIATE MEDICAL CARE IF:   You develop sudden or severe swelling anywhere in your body. This usually happens in the legs.  You gain 5 lb (2.3 kg) or more in a week.  You have a severe headache, dizziness, problems with your vision, or confusion.  You have severe abdominal pain.  You have lasting nausea or vomiting.  You have a seizure.  You have trouble moving any part of your body.  You develop numbness in your body.  You have trouble speaking.  You have any abnormal bleeding.  You develop a stiff neck.  You pass out. MAKE SURE YOU:   Understand these instructions.  Will watch your condition.  Will get help right away if you are not doing well or get worse.   This information is not intended to replace advice given to you by your health care provider. Make sure you discuss any questions you have with your health care provider.   Document Released: 09/01/2000 Document Revised: 09/09/2013 Document Reviewed: 06/27/2013 Elsevier Interactive Patient Education [employer]

## 2015-10-26 LAB — TORCH-IGM(TOXO/ RUB/ CMV/ HSV) W TITER
HSVI/II COMB AB IGM: 0.97 ratio — AB (ref 0.00–0.90)
Rubella IgM: <20 AU/mL (ref 0.0–19.9)

## 2015-10-26 LAB — INFECT DISEASE AB IGM REFLEX 1

## 2015-10-28 ENCOUNTER — Ambulatory Visit (HOSPITAL_COMMUNITY): Payer: Self-pay | Admitting: Psychiatry

## 2015-11-01 LAB — FACTOR 5 LEIDEN

## 2015-11-05 ENCOUNTER — Other Ambulatory Visit (HOSPITAL_COMMUNITY): Payer: Self-pay

## 2015-11-05 ENCOUNTER — Inpatient Hospital Stay (HOSPITAL_COMMUNITY): Admission: RE | Admit: 2015-11-05 | Payer: Self-pay | Source: Ambulatory Visit

## 2015-11-15 ENCOUNTER — Ambulatory Visit (HOSPITAL_BASED_OUTPATIENT_CLINIC_OR_DEPARTMENT_OTHER): Payer: Self-pay

## 2016-06-04 ENCOUNTER — Emergency Department (HOSPITAL_COMMUNITY)
Admission: EM | Admit: 2016-06-04 | Discharge: 2016-06-04 | Disposition: A | Discharge status: Home / Self Care | Payer: BLUE CROSS/BLUE SHIELD | Attending: Emergency Medicine | Admitting: Emergency Medicine

## 2016-06-04 ENCOUNTER — Encounter (HOSPITAL_COMMUNITY): Payer: Self-pay | Admitting: Nurse Practitioner

## 2016-06-04 DIAGNOSIS — Z79899 Other long term (current) drug therapy: Secondary | ICD-10-CM | POA: Insufficient documentation

## 2016-06-04 DIAGNOSIS — Z794 Long term (current) use of insulin: Secondary | ICD-10-CM | POA: Insufficient documentation

## 2016-06-04 DIAGNOSIS — N390 Urinary tract infection, site not specified: Secondary | ICD-10-CM | POA: Insufficient documentation

## 2016-06-04 DIAGNOSIS — R739 Hyperglycemia, unspecified: Secondary | ICD-10-CM

## 2016-06-04 DIAGNOSIS — E1065 Type 1 diabetes mellitus with hyperglycemia: Secondary | ICD-10-CM | POA: Insufficient documentation

## 2016-06-04 LAB — CBC WITH DIFFERENTIAL/PLATELET
Basophils Absolute: 0 10*3/uL (ref 0.0–0.1)
Basophils Relative: 0 %
Eosinophils Absolute: 0.2 10*3/uL (ref 0.0–0.7)
Eosinophils Relative: 3 %
HEMATOCRIT: 34.5 % — AB (ref 36.0–46.0)
Hemoglobin: 12.1 g/dL (ref 12.0–15.0)
LYMPHS ABS: 1.7 10*3/uL (ref 0.7–4.0)
LYMPHS PCT: 18 %
MCH: 31 pg (ref 26.0–34.0)
MCHC: 35.1 g/dL (ref 30.0–36.0)
MCV: 88.5 fL (ref 78.0–100.0)
MONO ABS: 0.7 10*3/uL (ref 0.1–1.0)
MONOS PCT: 8 %
NEUTROS ABS: 6.5 10*3/uL (ref 1.7–7.7)
Neutrophils Relative %: 71 %
Platelets: 385 10*3/uL (ref 150–400)
RBC: 3.9 MIL/uL (ref 3.87–5.11)
RDW: 14.1 % (ref 11.5–15.5)
WBC: 9.1 10*3/uL (ref 4.0–10.5)

## 2016-06-04 LAB — COMPREHENSIVE METABOLIC PANEL
ALT: 26 U/L (ref 14–54)
ANION GAP: 14 (ref 5–15)
AST: 32 U/L (ref 15–41)
Albumin: 4.1 g/dL (ref 3.5–5.0)
Alkaline Phosphatase: 109 U/L (ref 38–126)
BILIRUBIN TOTAL: 0.7 mg/dL (ref 0.3–1.2)
BUN: 17 mg/dL (ref 6–20)
CO2: 24 mmol/L (ref 22–32)
Calcium: 9.3 mg/dL (ref 8.9–10.3)
Chloride: 89 mmol/L — ABNORMAL LOW (ref 101–111)
Creatinine, Ser: 1.09 mg/dL — ABNORMAL HIGH (ref 0.44–1.00)
Glucose, Bld: 500 mg/dL — ABNORMAL HIGH (ref 65–99)
POTASSIUM: 3.8 mmol/L (ref 3.5–5.1)
Sodium: 127 mmol/L — ABNORMAL LOW (ref 135–145)
TOTAL PROTEIN: 7.7 g/dL (ref 6.5–8.1)

## 2016-06-04 LAB — URINE MICROSCOPIC-ADD ON

## 2016-06-04 LAB — URINALYSIS, ROUTINE W REFLEX MICROSCOPIC
Bilirubin Urine: NEGATIVE
Glucose, UA: >1000 mg/dL — AB
Ketones, ur: NEGATIVE mg/dL
Nitrite: POSITIVE — AB
PROTEIN: NEGATIVE mg/dL
Specific Gravity, Urine: 1.025 (ref 1.005–1.030)
pH: 5.5 (ref 5.0–8.0)

## 2016-06-04 LAB — CBG MONITORING, ED
GLUCOSE-CAPILLARY: 464 mg/dL — AB (ref 65–99)
GLUCOSE-CAPILLARY: 332 mg/dL — AB (ref 65–99)
Glucose-Capillary: 497 mg/dL — ABNORMAL HIGH (ref 65–99)

## 2016-06-04 MED ORDER — INSULIN ASPART PROT & ASPART (70-30 MIX) 100 UNIT/ML ~~LOC~~ SUSP: 10.0000 [IU] | Freq: Once | SUBCUTANEOUS | Status: DC

## 2016-06-04 MED ORDER — FLUCONAZOLE 150 MG PO TABS: 150.0000 mg | ORAL_TABLET | Freq: Once | ORAL | 0 refills | Status: AC

## 2016-06-04 MED ORDER — SODIUM CHLORIDE 0.9 % IV BOLUS (SEPSIS)
1000.0000 mL | Freq: Once | INTRAVENOUS | Status: AC
  Administered 2016-06-04: 1000 mL via INTRAVENOUS

## 2016-06-04 MED ORDER — CEPHALEXIN 500 MG PO CAPS: 500.0000 mg | ORAL_CAPSULE | Freq: Four times a day (QID) | ORAL | 0 refills | Status: DC

## 2016-06-04 MED ORDER — INSULIN ASPART 100 UNIT/ML ~~LOC~~ SOLN: 7.0000 [IU] | Freq: Once | SUBCUTANEOUS | Status: DC

## 2016-06-04 MED ORDER — INSULIN ASPART 100 UNIT/ML ~~LOC~~ SOLN
10.0000 [IU] | Freq: Once | SUBCUTANEOUS | Status: AC
  Administered 2016-06-04: 10 [IU] via SUBCUTANEOUS
  Filled 2016-06-04: qty 1

## 2016-06-04 MED ORDER — FLUCONAZOLE 150 MG PO TABS: 150.0000 mg | ORAL_TABLET | Freq: Once | ORAL | 0 refills | Status: DC

## 2016-06-04 MED ORDER — CEPHALEXIN 500 MG PO CAPS: 500.0000 mg | ORAL_CAPSULE | Freq: Three times a day (TID) | ORAL | 0 refills | Status: DC

## 2016-06-04 MED ORDER — CEPHALEXIN 500 MG PO CAPS
500.0000 mg | ORAL_CAPSULE | Freq: Once | ORAL | Status: AC
  Administered 2016-06-04: 500 mg via ORAL
  Filled 2016-06-04: qty 1

## 2016-06-04 MED ORDER — INSULIN GLARGINE 100 UNIT/ML ~~LOC~~ SOLN
18.0000 [IU] | Freq: Once | SUBCUTANEOUS | Status: AC
  Administered 2016-06-04: 18 [IU] via SUBCUTANEOUS
  Filled 2016-06-04: qty 0.18

## 2016-06-04 NOTE — Discharge Instructions (Signed)
Please call your endocrinologist tomorrow and inform them of her visit today. Monitor your blood sugars closely the next several days. Increase hydration.  Take antibiotics until completion as directed. Return to ER for fever, back pain, new or worsening symptoms, any additional concerns.
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
## 2016-06-04 NOTE — ED Notes (Signed)
Pt made aware of need for urine specimen. Call light at bedside.

## 2016-06-04 NOTE — ED Triage Notes (Signed)
Pt states she is a type one diabetic who has had challenges controlling her blood glucose at especially with the recent severe stressors and a URI. At home her reader was >600. Associated symptoms of polyuria, denies N/V or abdominal symptoms.

## 2016-06-04 NOTE — ED Notes (Signed)
Patient reports she has been "rationing" her insulin because she has no health insurance.

## 2016-07-20 ENCOUNTER — Telehealth: Payer: Self-pay | Admitting: *Deleted

## 2016-07-20 MED ORDER — ARIPIPRAZOLE 10 MG PO TABS: 10.0000 mg | ORAL_TABLET | Freq: Every day | ORAL | 2 refills | Status: DC

## 2016-07-20 NOTE — Telephone Encounter (Signed)
rx sent

## 2016-07-20 NOTE — Telephone Encounter (Signed)
Abilify 10 mg one daily

## 2016-07-20 NOTE — Telephone Encounter (Signed)
Insurance is agreeing to fill abilify if we can change to DAW 1. Please advise. I see abilify on medication list but it does not have a dosage with it.

## 2017-03-18 ENCOUNTER — Other Ambulatory Visit: Payer: Self-pay | Admitting: Physician Assistant

## 2017-03-18 MED ORDER — CLINDAMYCIN HCL 300 MG PO CAPS: 300.0000 mg | ORAL_CAPSULE | Freq: Three times a day (TID) | ORAL | 5 refills | Status: DC

## 2017-03-18 MED ORDER — FUROSEMIDE 20 MG PO TABS: 20.0000 mg | ORAL_TABLET | Freq: Every day | ORAL | 3 refills | Status: DC

## 2017-03-20 MED ORDER — CLINDAMYCIN HCL 300 MG PO CAPS: 300.0000 mg | ORAL_CAPSULE | Freq: Three times a day (TID) | ORAL | 5 refills | Status: DC

## 2017-07-16 ENCOUNTER — Other Ambulatory Visit: Payer: Self-pay | Admitting: Physician Assistant

## 2017-07-19 ENCOUNTER — Other Ambulatory Visit: Payer: Self-pay | Admitting: Physician Assistant

## 2017-07-19 MED ORDER — CLINDAMYCIN HCL 300 MG PO CAPS: 300.0000 mg | ORAL_CAPSULE | Freq: Three times a day (TID) | ORAL | 5 refills | Status: DC

## 2017-07-27 ENCOUNTER — Ambulatory Visit (INDEPENDENT_AMBULATORY_CARE_PROVIDER_SITE_OTHER): Payer: Self-pay | Admitting: Physician Assistant

## 2017-07-27 ENCOUNTER — Encounter: Payer: Self-pay | Admitting: Physician Assistant

## 2017-07-27 VITALS — BP 122/89 | HR 94 | Temp 98.0°F | Ht 64.0 in | Wt 136.6 lb

## 2017-07-27 DIAGNOSIS — L97509 Non-pressure chronic ulcer of other part of unspecified foot with unspecified severity: Secondary | ICD-10-CM | POA: Insufficient documentation

## 2017-07-27 DIAGNOSIS — R531 Weakness: Secondary | ICD-10-CM

## 2017-07-27 DIAGNOSIS — I890 Lymphedema, not elsewhere classified: Secondary | ICD-10-CM

## 2017-07-27 DIAGNOSIS — F339 Major depressive disorder, recurrent, unspecified: Secondary | ICD-10-CM

## 2017-07-27 DIAGNOSIS — E13621 Other specified diabetes mellitus with foot ulcer: Secondary | ICD-10-CM

## 2017-07-27 DIAGNOSIS — E10621 Type 1 diabetes mellitus with foot ulcer: Secondary | ICD-10-CM

## 2017-07-27 LAB — BAYER DCA HB A1C WAIVED: HB A1C (BAYER DCA - WAIVED): >14 % — ABNORMAL HIGH (ref <7.0)

## 2017-07-27 MED ORDER — CEPHALEXIN 500 MG PO CAPS: 500.0000 mg | ORAL_CAPSULE | Freq: Three times a day (TID) | ORAL | 0 refills | Status: DC

## 2017-07-27 MED ORDER — ALPRAZOLAM 0.25 MG PO TABS: 0.2500 mg | ORAL_TABLET | Freq: Three times a day (TID) | ORAL | 2 refills | Status: DC | PRN

## 2017-07-27 MED ORDER — ESCITALOPRAM OXALATE 20 MG PO TABS: 20.0000 mg | ORAL_TABLET | Freq: Every day | ORAL | 11 refills | Status: DC

## 2017-07-27 MED ORDER — ARIPIPRAZOLE 10 MG PO TABS: 10.0000 mg | ORAL_TABLET | Freq: Every day | ORAL | 11 refills | Status: DC

## 2017-07-27 NOTE — Patient Instructions (Signed)

## 2017-07-27 NOTE — Progress Notes (Signed)
BP 122/89   Pulse 94   Temp 98 F (36.7 C) (Oral)   Ht _0  (1.626 m)   Wt 136 lb 9.6 oz (62 kg)   BMI 23.45 kg/m    Subjective:    Patient ID: [name], female    DOB: Nov 20, 1980, 36 y.o.   MRN: [id number]  HPI: [name] is a 36 y.o. female presenting on 07/27/2017 for New Patient (Initial Visit); stumbling; Dizziness (feels dizzy most of the time ); Foot Swelling (right foot and blister on bottom of right foot ); and Extremity Weakness  This patient comes in with chronic swelling of the right lower limb.  She has not been seen by a vascular or had a test done but appears to be chronic lymphedema.  She was walking barefoot less than 2 months ago across a parking lot and started with a small sore at this point she has developed an ulcer.  She cannot feel it or have any pain.  There is not been any pus.  She is try to keep it clean and dry.  She has been without insurance but has come in today because she was overall feeling generally weak.  She denies any fever or chills.  She is weakness in her left unaffected leg.  She does not have lymphedema there.  She has not had labs done in some time.  We will get that ready also.  She does need refills.  She [place] applied for Medicaid at the county but has not heard back.  I do not think she should wait any longer and seeking medical attention.  I have also told her if anything gets worse to go to the emergency room.  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  Relevant past medical, surgical, family and social history reviewed and updated as indicated. Allergies and medications reviewed and updated.  Past Medical History:  Diagnosis Date  . Allergy    hayfever  . Chicken pox   . Depression   . Diabetes mellitus   . Migraines   . UTI (urinary tract infection)     Past Surgical History:  Procedure Laterality Date  . CESAREAN SECTION    . chiari malformation repair    . TONSILLECTOMY       Review of Systems  Constitutional: Positive for fatigue. Negative for activity change and fever.  HENT: Negative.   Eyes: Negative.   Respiratory: Negative.  Negative for cough, shortness of breath and wheezing.   Cardiovascular: Positive for leg swelling. Negative for chest pain and palpitations.  Gastrointestinal: Negative.  Negative for abdominal pain.  Endocrine: Negative.   Genitourinary: Negative.  Negative for dysuria.  Musculoskeletal: Negative.   Skin: Positive for wound.  Neurological: Positive for dizziness, weakness and numbness.    Allergies as of 07/27/2017      Reactions   Codeine Nausea And Vomiting   Sulfa Antibiotics Nausea And Vomiting      Medication List        Accurate as of 07/27/17 11:06 AM. Always use your most recent med list.          ALPRAZolam 0.25 MG tablet Commonly known as:  XANAX Take 1 tablet (0.25 mg total) 3 (three) times daily as needed by mouth for anxiety or sleep.   ARIPiprazole 10 MG tablet Commonly known as:  ABILIFY Take 1 tablet (10 mg total) daily by mouth.   cephALEXin 500 MG capsule Commonly known as:  KEFLEX Take  1 capsule (500 mg total) 3 (three) times daily by mouth.   cetirizine 10 MG tablet Commonly known as:  ZYRTEC Take 10 mg by mouth daily.   escitalopram 20 MG tablet Commonly known as:  LEXAPRO Take 1 tablet (20 mg total) daily by mouth.   furosemide 20 MG tablet Commonly known as:  LASIX Take 1 tablet (20 mg total) by mouth daily.   insulin glargine 100 UNIT/ML injection Commonly known as:  LANTUS Inject 20 Units 2 (two) times daily into the skin.   insulin NPH Human 100 UNIT/ML injection Commonly known as:  HUMULIN N,NOVOLIN N Inject 15 Units 3 (three) times daily with meals into the skin. Take 20 units in the AM and 18 units in the PM          Objective:    BP 122/89   Pulse 94   Temp 98 F (36.7 C) (Oral)   Ht _0  (1.626 m)   Wt 136 lb 9.6 oz (62 kg)   BMI 23.45 kg/m   Allergies   Allergen Reactions  . Codeine Nausea And Vomiting  . Sulfa Antibiotics Nausea And Vomiting    Physical Exam  Constitutional: She is oriented to person, place, and time. She appears well-developed and well-nourished.  Thin female, sickly appearance  HENT:  Head: Normocephalic and atraumatic.  Right Ear: Tympanic membrane, external ear and ear canal normal.  Left Ear: Tympanic membrane, external ear and ear canal normal.  Nose: Nose normal. No rhinorrhea.  Mouth/Throat: Oropharynx is clear and moist and mucous membranes are normal. No oropharyngeal exudate or posterior oropharyngeal erythema.  Eyes: Conjunctivae and EOM are normal. Pupils are equal, round, and reactive to light.  Neck: Normal range of motion. Neck supple.  Cardiovascular: Normal rate, regular rhythm, normal heart sounds and intact distal pulses.  Pulmonary/Chest: Effort normal and breath sounds normal.  Abdominal: Soft. Bowel sounds are normal.  Musculoskeletal:       Right foot: There is swelling.       Feet:  Mildly deep ulcer with granulomatous tissue.  Entire foot with severe edema, moderate swelling up the midlevel of right leg.  There are some chronic skin changes that show darkening.  Reports having problems last year with swelling.  Over this past summer she has had some cellulitis and edema.  It had gotten some better but has gotten worse in the past few weeks.  Neurological: She is alert and oriented to person, place, and time. She has normal reflexes.  Skin: Skin is warm and dry. No rash noted.  Psychiatric: She has a normal mood and affect. Her behavior is normal. Judgment and thought content normal.        Assessment & Plan:   1. Type 1 diabetes mellitus with foot ulcer (HCC) - CBC with Differential/Platelet - CMP14+EGFR - Thyroid Panel With TSH - Bayer DCA Hb A1c Waived - Ambulatory referral to Pain Clinic  2. Foot ulcer due to secondary DM ([place]) - CBC with Differential/Platelet - CMP14+EGFR -  Thyroid Panel With TSH - Bayer DCA Hb A1c Waived - cephALEXin (KEFLEX) 500 MG capsule; Take 1 capsule (500 mg total) 3 (three) times daily by mouth.  Dispense: 30 capsule; Refill: 0 - Ambulatory referral to Pain Clinic  3. Lymphedema of right lower extremity - Ambulatory referral to Pain Clinic  4. Weakness generalized - CBC with Differential/Platelet - CMP14+EGFR - Thyroid Panel With TSH - Bayer DCA Hb A1c Waived  5. Depression, recurrent (HCC) - ARIPiprazole (ABILIFY)  10 MG tablet; Take 1 tablet (10 mg total) daily by mouth.  Dispense: 30 tablet; Refill: 11 - ALPRAZolam (XANAX) 0.25 MG tablet; Take 1 tablet (0.25 mg total) 3 (three) times daily as needed by mouth for anxiety or sleep.  Dispense: 20 tablet; Refill: 2 - escitalopram (LEXAPRO) 20 MG tablet; Take 1 tablet (20 mg total) daily by mouth.  Dispense: 30 tablet; Refill: 11     Current Outpatient Medications:  .  cetirizine (ZYRTEC) 10 MG tablet, Take 10 mg by mouth daily., Disp: , Rfl:  .  furosemide (LASIX) 20 MG tablet, Take 1 tablet (20 mg total) by mouth daily., Disp: 90 tablet, Rfl: 3 .  insulin glargine (LANTUS) 100 UNIT/ML injection, Inject 20 Units 2 (two) times daily into the skin. , Disp: , Rfl:  .  insulin NPH Human (HUMULIN N,NOVOLIN N) 100 UNIT/ML injection, Inject 15 Units 3 (three) times daily with meals into the skin. Take 20 units in the AM and 18 units in the PM, Disp: , Rfl:  .  ALPRAZolam (XANAX) 0.25 MG tablet, Take 1 tablet (0.25 mg total) 3 (three) times daily as needed by mouth for anxiety or sleep., Disp: 20 tablet, Rfl: 2 .  ARIPiprazole (ABILIFY) 10 MG tablet, Take 1 tablet (10 mg total) daily by mouth., Disp: 30 tablet, Rfl: 11 .  cephALEXin (KEFLEX) 500 MG capsule, Take 1 capsule (500 mg total) 3 (three) times daily by mouth., Disp: 30 capsule, Rfl: 0 .  escitalopram (LEXAPRO) 20 MG tablet, Take 1 tablet (20 mg total) daily by mouth., Disp: 30 tablet, Rfl: 11 Continue all other maintenance  medications as listed above.  Follow up plan: Return in about 4 weeks (around 08/24/2017) for recheck.  Educational handout given for foot ulcer  Terald Sleeper PA-C [place] [address]  [place], [place] [address] [phone]   07/27/2017, 11:06 AM

## 2017-07-28 LAB — CBC WITH DIFFERENTIAL/PLATELET
BASOS ABS: 0 10*3/uL (ref 0.0–0.2)
Basos: 0 %
EOS (ABSOLUTE): 0.3 10*3/uL (ref 0.0–0.4)
Eos: 6 %
Hematocrit: 37.8 % (ref 34.0–46.6)
Hemoglobin: 11.7 g/dL (ref 11.1–15.9)
IMMATURE GRANS (ABS): 0 10*3/uL (ref 0.0–0.1)
Immature Granulocytes: 0 %
LYMPHS: 25 %
Lymphocytes Absolute: 1.5 10*3/uL (ref 0.7–3.1)
MCH: 27.9 pg (ref 26.6–33.0)
MCHC: 31 g/dL — ABNORMAL LOW (ref 31.5–35.7)
MCV: 90 fL (ref 79–97)
Monocytes Absolute: 0.4 10*3/uL (ref 0.1–0.9)
Monocytes: 7 %
NEUTROS ABS: 3.5 10*3/uL (ref 1.4–7.0)
NEUTROS PCT: 62 %
PLATELETS: 386 10*3/uL — AB (ref 150–379)
RBC: 4.19 x10E6/uL (ref 3.77–5.28)
RDW: 15.7 % — AB (ref 12.3–15.4)
WBC: 5.7 10*3/uL (ref 3.4–10.8)

## 2017-07-28 LAB — CMP14+EGFR
ALK PHOS: 164 IU/L — AB (ref 39–117)
ALT: 24 IU/L (ref 0–32)
AST: 22 IU/L (ref 0–40)
Albumin/Globulin Ratio: 1.1 — ABNORMAL LOW (ref 1.2–2.2)
Albumin: 3.7 g/dL (ref 3.5–5.5)
BUN/Creatinine Ratio: 13 (ref 9–23)
BUN: 9 mg/dL (ref 6–20)
Bilirubin Total: <0.2 mg/dL (ref 0.0–1.2)
CO2: 26 mmol/L (ref 20–29)
Calcium: 9 mg/dL (ref 8.7–10.2)
Chloride: 94 mmol/L — ABNORMAL LOW (ref 96–106)
Creatinine, Ser: 0.68 mg/dL (ref 0.57–1.00)
GFR calc Af Amer: 128 mL/min/{1.73_m2} (ref >59)
GFR calc non Af Amer: 111 mL/min/{1.73_m2} (ref >59)
GLUCOSE: 589 mg/dL — AB (ref 65–99)
Globulin, Total: 3.5 g/dL (ref 1.5–4.5)
POTASSIUM: 4.4 mmol/L (ref 3.5–5.2)
SODIUM: 130 mmol/L — AB (ref 134–144)
TOTAL PROTEIN: 7.2 g/dL (ref 6.0–8.5)

## 2017-07-28 LAB — THYROID PANEL WITH TSH
Free Thyroxine Index: 1.8 (ref 1.2–4.9)
T3 Uptake Ratio: 28 % (ref 24–39)
T4, Total: 6.6 ug/dL (ref 4.5–12.0)
TSH: 4.14 u[IU]/mL (ref 0.450–4.500)

## 2017-08-27 ENCOUNTER — Telehealth: Payer: Self-pay | Admitting: *Deleted

## 2017-08-27 NOTE — Telephone Encounter (Signed)
Patient states her fasting blood sugars are in the 80's and wants to know if she should cut back on her sliding scale insulin.  She takes 15 units 3 times a day with meal.

## 2017-08-28 ENCOUNTER — Ambulatory Visit: Payer: Self-pay | Admitting: Physician Assistant

## 2017-08-29 ENCOUNTER — Other Ambulatory Visit: Payer: Self-pay | Admitting: Physician Assistant

## 2017-08-29 MED ORDER — AZITHROMYCIN 250 MG PO TABS: ORAL_TABLET | ORAL | 0 refills | Status: DC

## 2017-08-29 NOTE — Telephone Encounter (Signed)
Na/ww 12/12 

## 2017-08-29 NOTE — Telephone Encounter (Signed)
Noted:  Breakfast and lunch - BG less than 80: none, BG 80-120: 8 units, BG 121-170: 9 units, BG 171-220: 10 units, BG 221-270: 11 units, BG 271 - 300: 12 units, BG 301 or greater: 13 units Supper - BG less than 80: none, BG 80-120: 5 units, BG 121-170: 6 units, BG 171-220: 7 units, BG 221-270: 8 units, BG 271 - 300: 9 units, BG 301 or greater: 10 units

## 2017-11-12 NOTE — Telephone Encounter (Signed)
Several attempts have been made to contact patient. This encounter will be closed.  

## 2017-11-13 ENCOUNTER — Other Ambulatory Visit: Payer: Self-pay | Admitting: Physician Assistant

## 2017-11-13 DIAGNOSIS — Z01818 Encounter for other preprocedural examination: Secondary | ICD-10-CM

## 2017-11-14 ENCOUNTER — Telehealth: Payer: Self-pay | Admitting: Physician Assistant

## 2017-11-14 ENCOUNTER — Other Ambulatory Visit: Payer: Self-pay

## 2017-11-14 ENCOUNTER — Ambulatory Visit (INDEPENDENT_AMBULATORY_CARE_PROVIDER_SITE_OTHER): Payer: Self-pay | Admitting: *Deleted

## 2017-11-14 DIAGNOSIS — Z01818 Encounter for other preprocedural examination: Secondary | ICD-10-CM

## 2017-11-14 DIAGNOSIS — Z23 Encounter for immunization: Secondary | ICD-10-CM

## 2017-11-14 NOTE — Progress Notes (Signed)
Patient here now to get labs.

## 2017-11-15 LAB — CMP14+EGFR
ALT: 23 IU/L (ref 0–32)
AST: 15 IU/L (ref 0–40)
Albumin/Globulin Ratio: 1.1 — ABNORMAL LOW (ref 1.2–2.2)
Albumin: 3.9 g/dL (ref 3.5–5.5)
Alkaline Phosphatase: 149 IU/L — ABNORMAL HIGH (ref 39–117)
BUN/Creatinine Ratio: 11 (ref 9–23)
BUN: 7 mg/dL (ref 6–20)
Bilirubin Total: 0.2 mg/dL (ref 0.0–1.2)
CO2: 23 mmol/L (ref 20–29)
CREATININE: 0.62 mg/dL (ref 0.57–1.00)
Calcium: 8.8 mg/dL (ref 8.7–10.2)
Chloride: 100 mmol/L (ref 96–106)
GFR, EST AFRICAN AMERICAN: 132 mL/min/{1.73_m2} (ref >59)
GFR, EST NON AFRICAN AMERICAN: 115 mL/min/{1.73_m2} (ref >59)
Globulin, Total: 3.5 g/dL (ref 1.5–4.5)
Glucose: 284 mg/dL — ABNORMAL HIGH (ref 65–99)
Potassium: 3.9 mmol/L (ref 3.5–5.2)
Sodium: 140 mmol/L (ref 134–144)
TOTAL PROTEIN: 7.4 g/dL (ref 6.0–8.5)

## 2017-11-15 LAB — CBC WITH DIFFERENTIAL/PLATELET
Basophils Absolute: 0 10*3/uL (ref 0.0–0.2)
Basos: 1 %
EOS (ABSOLUTE): 0.3 10*3/uL (ref 0.0–0.4)
EOS: 5 %
HEMATOCRIT: 34.7 % (ref 34.0–46.6)
HEMOGLOBIN: 11.3 g/dL (ref 11.1–15.9)
IMMATURE GRANS (ABS): 0 10*3/uL (ref 0.0–0.1)
IMMATURE GRANULOCYTES: 0 %
LYMPHS: 25 %
Lymphocytes Absolute: 1.6 10*3/uL (ref 0.7–3.1)
MCH: 29.1 pg (ref 26.6–33.0)
MCHC: 32.6 g/dL (ref 31.5–35.7)
MCV: 89 fL (ref 79–97)
MONOCYTES: 11 %
Monocytes Absolute: 0.7 10*3/uL (ref 0.1–0.9)
NEUTROS ABS: 3.6 10*3/uL (ref 1.4–7.0)
NEUTROS PCT: 58 %
PLATELETS: 387 10*3/uL — AB (ref 150–379)
RBC: 3.88 x10E6/uL (ref 3.77–5.28)
RDW: 13.4 % (ref 12.3–15.4)
WBC: 6.2 10*3/uL (ref 3.4–10.8)

## 2017-11-16 ENCOUNTER — Other Ambulatory Visit: Payer: Self-pay | Admitting: Physician Assistant

## 2017-11-16 NOTE — Telephone Encounter (Signed)
faxed

## 2017-11-19 ENCOUNTER — Other Ambulatory Visit: Payer: Self-pay | Admitting: Physician Assistant

## 2017-11-19 MED ORDER — AZITHROMYCIN 250 MG PO TABS: ORAL_TABLET | ORAL | 1 refills | Status: DC

## 2017-11-26 ENCOUNTER — Encounter: Payer: Self-pay | Admitting: Physician Assistant

## 2017-11-26 ENCOUNTER — Ambulatory Visit (INDEPENDENT_AMBULATORY_CARE_PROVIDER_SITE_OTHER): Payer: Self-pay | Admitting: Physician Assistant

## 2017-11-26 VITALS — BP 112/81 | HR 88 | Temp 97.7°F | Ht 64.0 in | Wt 141.2 lb

## 2017-11-26 DIAGNOSIS — Z01818 Encounter for other preprocedural examination: Secondary | ICD-10-CM

## 2017-11-26 MED ORDER — INSULIN REGULAR HUMAN 100 UNIT/ML IJ SOLN
INTRAMUSCULAR | Status: DC
Start: ? — End: 2017-11-26

## 2017-11-26 MED ORDER — DEXTROSE 50 % IV SOLN
25.00 | INTRAVENOUS | Status: DC
Start: ? — End: 2017-11-26

## 2017-11-26 MED ORDER — LACTATED RINGERS IV SOLN
INTRAVENOUS | Status: DC
Start: ? — End: 2017-11-26

## 2017-11-26 NOTE — Progress Notes (Signed)
BP 112/81   Pulse 88   Temp 97.7 F (36.5 C) (Oral)   Ht '5\' 4"'  (1.626 m)   Wt 141 lb 3.2 oz (64 kg)   BMI 24.24 kg/m    Subjective:    Patient ID: [name], female    DOB: 1981/05/09, 37 y.o.   MRN: [id number]  HPI: [name] is a 38 y.o. female presenting on 11/26/2017 for surgical clearance  This patient comes in for pre-surgical physical examination. All medications are reviewed today. There are no reports of any problems with the medications. All of the medical conditions are reviewed and updated.  Lab work is reviewed and will be ordered as medically necessary. There are no new problems reported with today's visit.  Patient reports doing well overall.  Labs are reviewed and A1c was greater than 14.   Past Medical History:  Diagnosis Date  . Allergy    hayfever  . Chicken pox   . Depression   . Diabetes mellitus   . Migraines   . UTI (urinary tract infection)    Relevant past medical, surgical, family and social history reviewed and updated as indicated. Interim medical history since our last visit reviewed. Allergies and medications reviewed and updated. DATA REVIEWED: CHART IN EPIC  Family History reviewed for pertinent findings.  Review of Systems  Constitutional: Negative.  Negative for activity change, fatigue and fever.  HENT: Negative.   Eyes: Negative.   Respiratory: Negative.  Negative for cough.   Cardiovascular: Negative.  Negative for chest pain.  Gastrointestinal: Negative.  Negative for abdominal pain.  Endocrine: Negative.   Genitourinary: Negative.  Negative for dysuria.  Musculoskeletal: Negative.   Skin: Negative.   Neurological: Negative.     Allergies as of 11/26/2017      Reactions   Morphine Itching   Codeine Nausea And Vomiting   Sulfa Antibiotics Nausea And Vomiting      Medication List        Accurate as of 11/26/17  2:00 PM. Always use your most recent med list.          ALPRAZolam 0.25 MG  tablet Commonly known as:  XANAX Take 1 tablet (0.25 mg total) 3 (three) times daily as needed by mouth for anxiety or sleep.   ARIPiprazole 10 MG tablet Commonly known as:  ABILIFY Take 1 tablet (10 mg total) daily by mouth.   azithromycin 250 MG tablet Commonly known as:  ZITHROMAX Take as directed   cetirizine 10 MG tablet Commonly known as:  ZYRTEC Take 10 mg by mouth daily.   escitalopram 20 MG tablet Commonly known as:  LEXAPRO Take 1 tablet (20 mg total) daily by mouth.   furosemide 20 MG tablet Commonly known as:  LASIX Take 1 tablet (20 mg total) by mouth daily.   insulin glargine 100 UNIT/ML injection Commonly known as:  LANTUS Inject 20 Units 2 (two) times daily into the skin.   insulin NPH Human 100 UNIT/ML injection Commonly known as:  HUMULIN N,NOVOLIN N Inject 15 Units 3 (three) times daily with meals into the skin. Take 20 units in the AM and 18 units in the PM          Objective:    BP 112/81   Pulse 88   Temp 97.7 F (36.5 C) (Oral)   Ht '5\' 4"'  (1.626 m)   Wt 141 lb 3.2 oz (64 kg)   BMI 24.24 kg/m   Allergies  Allergen Reactions  . Morphine Itching  .  Codeine Nausea And Vomiting  . Sulfa Antibiotics Nausea And Vomiting    Wt Readings from Last 3 Encounters:  11/26/17 141 lb 3.2 oz (64 kg)  07/27/17 136 lb 9.6 oz (62 kg)  06/04/16 127 lb 9.6 oz (57.9 kg)    Physical Exam  Constitutional: She is oriented to person, place, and time. She appears well-developed and well-nourished.  HENT:  Head: Normocephalic and atraumatic.  Right Ear: Tympanic membrane, external ear and ear canal normal.  Left Ear: Tympanic membrane, external ear and ear canal normal.  Nose: Nose normal. No rhinorrhea.  Mouth/Throat: Oropharynx is clear and moist and mucous membranes are normal. No oropharyngeal exudate or posterior oropharyngeal erythema.  Eyes: Conjunctivae and EOM are normal. Pupils are equal, round, and reactive to light.  Neck: Normal range of  motion. Neck supple.  Cardiovascular: Normal rate, regular rhythm, normal heart sounds and intact distal pulses.  Pulmonary/Chest: Effort normal and breath sounds normal.  Abdominal: Soft. Bowel sounds are normal.  Neurological: She is alert and oriented to person, place, and time. She has normal reflexes.  Skin: Skin is warm and dry. No rash noted.  Psychiatric: She has a normal mood and affect. Her behavior is normal. Judgment and thought content normal.  Nursing note and vitals reviewed.   Results for orders placed or performed in visit on 11/14/17  CMP14+EGFR  Result Value Ref Range   Glucose 284 (H) 65 - 99 mg/dL   BUN 7 6 - 20 mg/dL   Creatinine, Ser 0.62 0.57 - 1.00 mg/dL   GFR calc non Af Amer 115 >59 mL/min/1.73   GFR calc Af Amer 132 >59 mL/min/1.73   BUN/Creatinine Ratio 11 9 - 23   Sodium 140 134 - 144 mmol/L   Potassium 3.9 3.5 - 5.2 mmol/L   Chloride 100 96 - 106 mmol/L   CO2 23 20 - 29 mmol/L   Calcium 8.8 8.7 - 10.2 mg/dL   Total Protein 7.4 6.0 - 8.5 g/dL   Albumin 3.9 3.5 - 5.5 g/dL   Globulin, Total 3.5 1.5 - 4.5 g/dL   Albumin/Globulin Ratio 1.1 (L) 1.2 - 2.2   Bilirubin Total 0.2 0.0 - 1.2 mg/dL   Alkaline Phosphatase 149 (H) 39 - 117 IU/L   AST 15 0 - 40 IU/L   ALT 23 0 - 32 IU/L  CBC with Differential/Platelet  Result Value Ref Range   WBC 6.2 3.4 - 10.8 x10E3/uL   RBC 3.88 3.77 - 5.28 x10E6/uL   Hemoglobin 11.3 11.1 - 15.9 g/dL   Hematocrit 34.7 34.0 - 46.6 %   MCV 89 79 - 97 fL   MCH 29.1 26.6 - 33.0 pg   MCHC 32.6 31.5 - 35.7 g/dL   RDW 13.4 12.3 - 15.4 %   Platelets 387 (H) 150 - 379 x10E3/uL   Neutrophils 58 Not Estab. %   Lymphs 25 Not Estab. %   Monocytes 11 Not Estab. %   Eos 5 Not Estab. %   Basos 1 Not Estab. %   Neutrophils Absolute 3.6 1.4 - 7.0 x10E3/uL   Lymphocytes Absolute 1.6 0.7 - 3.1 x10E3/uL   Monocytes Absolute 0.7 0.1 - 0.9 x10E3/uL   EOS (ABSOLUTE) 0.3 0.0 - 0.4 x10E3/uL   Basophils Absolute 0.0 0.0 - 0.2 x10E3/uL    Immature Granulocytes 0 Not Estab. %   Immature Grans (Abs) 0.0 0.0 - 0.1 x10E3/uL      Assessment & Plan:   1. Pre-op exam   Continue all  other maintenance medications as listed above.  Follow up plan: Return in about 3 months (around 02/26/2018) for and lab.  Educational handout given for [place] PA-C [place] [address]  [place], [place] [address] [phone]   11/26/2017, 2:00 PM

## 2017-12-03 ENCOUNTER — Ambulatory Visit: Payer: Self-pay | Admitting: Physician Assistant

## 2017-12-04 ENCOUNTER — Encounter: Payer: Self-pay | Admitting: Physician Assistant

## 2017-12-04 ENCOUNTER — Other Ambulatory Visit: Payer: Self-pay | Admitting: Physician Assistant

## 2017-12-04 DIAGNOSIS — E11610 Type 2 diabetes mellitus with diabetic neuropathic arthropathy: Secondary | ICD-10-CM

## 2017-12-04 HISTORY — DX: Type 2 diabetes mellitus with diabetic neuropathic arthropathy: E11.610

## 2017-12-04 MED ORDER — INSULIN GLARGINE 100 UNIT/ML ~~LOC~~ SOLN: 60.0000 [IU] | Freq: Two times a day (BID) | SUBCUTANEOUS | 3 refills | Status: DC

## 2017-12-04 MED ORDER — INSULIN GLULISINE 100 UNIT/ML IJ SOLN: 10.0000 [IU] | Freq: Three times a day (TID) | INTRAMUSCULAR | 3 refills | Status: DC

## 2017-12-05 MED ORDER — ALPRAZOLAM 0.25 MG PO TABS
0.25 | ORAL_TABLET | ORAL | Status: DC
Start: ? — End: 2017-12-05

## 2017-12-05 MED ORDER — HEPARIN SODIUM (PORCINE) 5000 UNIT/ML IJ SOLN
INTRAMUSCULAR | Status: DC
Start: 2017-12-03 — End: 2017-12-05

## 2017-12-05 MED ORDER — MAGNESIUM HYDROXIDE 400 MG/5ML PO SUSP
15.00 | ORAL | Status: DC
Start: ? — End: 2017-12-05

## 2017-12-05 MED ORDER — VITAMIN B-12 1000 MCG PO TABS
ORAL_TABLET | ORAL | Status: DC
Start: 2017-12-04 — End: 2017-12-05

## 2017-12-05 MED ORDER — INSULIN LISPRO 100 UNIT/ML ~~LOC~~ SOLN
SUBCUTANEOUS | Status: DC
Start: 2017-12-03 — End: 2017-12-05

## 2017-12-05 MED ORDER — VANCOMYCIN HCL IN NACL 1.25-0.9 GM/250ML-% IV SOLN
1.25 | INTRAVENOUS | Status: DC
Start: 2017-12-03 — End: 2017-12-05

## 2017-12-05 MED ORDER — FERROUS SULFATE 325 (65 FE) MG PO TABS
325.00 | ORAL_TABLET | ORAL | Status: DC
Start: 2017-12-03 — End: 2017-12-05

## 2017-12-05 MED ORDER — INSULIN LISPRO 100 UNIT/ML ~~LOC~~ SOLN
SUBCUTANEOUS | Status: DC
Start: ? — End: 2017-12-05

## 2017-12-05 MED ORDER — ACETAMINOPHEN 325 MG PO TABS
650.00 | ORAL_TABLET | ORAL | Status: DC
Start: ? — End: 2017-12-05

## 2017-12-05 MED ORDER — ANTACID & ANTIGAS 200-200-20 MG/5ML PO SUSP
30.00 | ORAL | Status: DC
Start: ? — End: 2017-12-05

## 2017-12-05 MED ORDER — INSULIN GLARGINE 100 UNIT/ML ~~LOC~~ SOLN
SUBCUTANEOUS | Status: DC
Start: 2017-12-03 — End: 2017-12-05

## 2017-12-05 MED ORDER — GENERIC EXTERNAL MEDICATION
Status: DC
Start: ? — End: 2017-12-05

## 2017-12-05 MED ORDER — ESCITALOPRAM OXALATE 10 MG PO TABS
20.00 | ORAL_TABLET | ORAL | Status: DC
Start: 2017-12-04 — End: 2017-12-05

## 2017-12-05 MED ORDER — FENTANYL CITRATE (PF) 2500 MCG/50ML IJ SOLN
INTRAMUSCULAR | Status: DC
Start: ? — End: 2017-12-05

## 2017-12-05 MED ORDER — OXYCODONE HCL 5 MG PO TABS
5.00 | ORAL_TABLET | ORAL | Status: DC
Start: ? — End: 2017-12-05

## 2017-12-05 MED ORDER — ARIPIPRAZOLE 10 MG PO TABS
10.00 | ORAL_TABLET | ORAL | Status: DC
Start: 2017-12-04 — End: 2017-12-05

## 2017-12-05 MED ORDER — CALCIUM CARBONATE-VITAMIN D 600-400 MG-UNIT PO TABS
ORAL_TABLET | ORAL | Status: DC
Start: 2017-12-04 — End: 2017-12-05

## 2017-12-26 DIAGNOSIS — Z9119 Patient's noncompliance with other medical treatment and regimen: Secondary | ICD-10-CM | POA: Insufficient documentation

## 2017-12-26 DIAGNOSIS — Z91199 Patient's noncompliance with other medical treatment and regimen due to unspecified reason: Secondary | ICD-10-CM | POA: Insufficient documentation

## 2017-12-26 DIAGNOSIS — F331 Major depressive disorder, recurrent, moderate: Secondary | ICD-10-CM | POA: Insufficient documentation

## 2017-12-28 DIAGNOSIS — E559 Vitamin D deficiency, unspecified: Secondary | ICD-10-CM | POA: Insufficient documentation

## 2018-02-06 ENCOUNTER — Telehealth: Payer: Self-pay | Admitting: Physician Assistant

## 2018-02-06 NOTE — Telephone Encounter (Signed)
Aware.  Mucinex for mucous  And saline for nasal problems.  Allegr, claritin, zyrtec are also helpful for allergies.

## 2018-02-25 ENCOUNTER — Ambulatory Visit: Payer: Self-pay | Admitting: Physician Assistant

## 2018-02-27 ENCOUNTER — Other Ambulatory Visit: Payer: Self-pay | Admitting: Physician Assistant

## 2018-02-27 MED ORDER — CIPROFLOXACIN HCL 500 MG PO TABS: 500.0000 mg | ORAL_TABLET | Freq: Two times a day (BID) | ORAL | 0 refills | Status: DC

## 2018-04-25 ENCOUNTER — Inpatient Hospital Stay (HOSPITAL_COMMUNITY): Payer: Self-pay

## 2018-04-25 ENCOUNTER — Other Ambulatory Visit: Payer: Self-pay

## 2018-04-25 ENCOUNTER — Inpatient Hospital Stay: Admission: AD | Admit: 2018-04-25 | Payer: Self-pay | Admitting: Critical Care Medicine

## 2018-04-25 ENCOUNTER — Encounter (HOSPITAL_COMMUNITY): Payer: Self-pay | Admitting: Emergency Medicine

## 2018-04-25 ENCOUNTER — Inpatient Hospital Stay (HOSPITAL_COMMUNITY)
Admission: EM | Admit: 2018-04-25 | Discharge: 2018-04-29 | DRG: 871 | Disposition: A | Discharge status: Home / Self Care | Payer: Self-pay | Attending: Internal Medicine | Admitting: Internal Medicine

## 2018-04-25 ENCOUNTER — Emergency Department (HOSPITAL_COMMUNITY): Payer: Self-pay

## 2018-04-25 DIAGNOSIS — E10621 Type 1 diabetes mellitus with foot ulcer: Secondary | ICD-10-CM | POA: Diagnosis present

## 2018-04-25 DIAGNOSIS — E1010 Type 1 diabetes mellitus with ketoacidosis without coma: Secondary | ICD-10-CM | POA: Diagnosis present

## 2018-04-25 DIAGNOSIS — E10649 Type 1 diabetes mellitus with hypoglycemia without coma: Secondary | ICD-10-CM | POA: Diagnosis not present

## 2018-04-25 DIAGNOSIS — E1011 Type 1 diabetes mellitus with ketoacidosis with coma: Secondary | ICD-10-CM

## 2018-04-25 DIAGNOSIS — R945 Abnormal results of liver function studies: Secondary | ICD-10-CM

## 2018-04-25 DIAGNOSIS — A419 Sepsis, unspecified organism: Principal | ICD-10-CM | POA: Diagnosis present

## 2018-04-25 DIAGNOSIS — E0810 Diabetes mellitus due to underlying condition with ketoacidosis without coma: Secondary | ICD-10-CM

## 2018-04-25 DIAGNOSIS — R197 Diarrhea, unspecified: Secondary | ICD-10-CM | POA: Diagnosis not present

## 2018-04-25 DIAGNOSIS — E876 Hypokalemia: Secondary | ICD-10-CM | POA: Diagnosis not present

## 2018-04-25 DIAGNOSIS — G9341 Metabolic encephalopathy: Secondary | ICD-10-CM | POA: Diagnosis present

## 2018-04-25 DIAGNOSIS — M8580 Other specified disorders of bone density and structure, unspecified site: Secondary | ICD-10-CM | POA: Diagnosis present

## 2018-04-25 DIAGNOSIS — E1310 Other specified diabetes mellitus with ketoacidosis without coma: Secondary | ICD-10-CM

## 2018-04-25 DIAGNOSIS — Z794 Long term (current) use of insulin: Secondary | ICD-10-CM

## 2018-04-25 DIAGNOSIS — L97419 Non-pressure chronic ulcer of right heel and midfoot with unspecified severity: Secondary | ICD-10-CM | POA: Diagnosis present

## 2018-04-25 DIAGNOSIS — I10 Essential (primary) hypertension: Secondary | ICD-10-CM | POA: Diagnosis not present

## 2018-04-25 DIAGNOSIS — K824 Cholesterolosis of gallbladder: Secondary | ICD-10-CM | POA: Diagnosis present

## 2018-04-25 DIAGNOSIS — R6521 Severe sepsis with septic shock: Secondary | ICD-10-CM | POA: Diagnosis present

## 2018-04-25 DIAGNOSIS — D638 Anemia in other chronic diseases classified elsewhere: Secondary | ICD-10-CM | POA: Diagnosis present

## 2018-04-25 DIAGNOSIS — R7989 Other specified abnormal findings of blood chemistry: Secondary | ICD-10-CM

## 2018-04-25 DIAGNOSIS — F819 Developmental disorder of scholastic skills, unspecified: Secondary | ICD-10-CM | POA: Diagnosis present

## 2018-04-25 DIAGNOSIS — Z885 Allergy status to narcotic agent status: Secondary | ICD-10-CM

## 2018-04-25 DIAGNOSIS — Z8249 Family history of ischemic heart disease and other diseases of the circulatory system: Secondary | ICD-10-CM

## 2018-04-25 DIAGNOSIS — E860 Dehydration: Secondary | ICD-10-CM | POA: Diagnosis present

## 2018-04-25 DIAGNOSIS — Z882 Allergy status to sulfonamides status: Secondary | ICD-10-CM

## 2018-04-25 DIAGNOSIS — E875 Hyperkalemia: Secondary | ICD-10-CM | POA: Diagnosis present

## 2018-04-25 DIAGNOSIS — N179 Acute kidney failure, unspecified: Secondary | ICD-10-CM | POA: Diagnosis present

## 2018-04-25 DIAGNOSIS — F329 Major depressive disorder, single episode, unspecified: Secondary | ICD-10-CM | POA: Diagnosis present

## 2018-04-25 DIAGNOSIS — E1110 Type 2 diabetes mellitus with ketoacidosis without coma: Secondary | ICD-10-CM | POA: Diagnosis present

## 2018-04-25 DIAGNOSIS — Z452 Encounter for adjustment and management of vascular access device: Secondary | ICD-10-CM

## 2018-04-25 DIAGNOSIS — J811 Chronic pulmonary edema: Secondary | ICD-10-CM | POA: Diagnosis present

## 2018-04-25 DIAGNOSIS — R0902 Hypoxemia: Secondary | ICD-10-CM | POA: Diagnosis present

## 2018-04-25 LAB — GLUCOSE, CAPILLARY
GLUCOSE-CAPILLARY: 579 mg/dL — AB (ref 70–99)
GLUCOSE-CAPILLARY: 445 mg/dL — AB (ref 70–99)
GLUCOSE-CAPILLARY: 264 mg/dL — AB (ref 70–99)
GLUCOSE-CAPILLARY: 145 mg/dL — AB (ref 70–99)
GLUCOSE-CAPILLARY: 195 mg/dL — AB (ref 70–99)
GLUCOSE-CAPILLARY: 92 mg/dL (ref 70–99)
GLUCOSE-CAPILLARY: 82 mg/dL (ref 70–99)
Glucose-Capillary: 523 mg/dL (ref 70–99)
Glucose-Capillary: 307 mg/dL — ABNORMAL HIGH (ref 70–99)
Glucose-Capillary: 214 mg/dL — ABNORMAL HIGH (ref 70–99)
Glucose-Capillary: 176 mg/dL — ABNORMAL HIGH (ref 70–99)
Glucose-Capillary: 128 mg/dL — ABNORMAL HIGH (ref 70–99)

## 2018-04-25 LAB — BLOOD GAS, VENOUS
ACID-BASE DEFICIT: 23.6 mmol/L — AB (ref 0.0–2.0)
Bicarbonate: 7.2 mmol/L — ABNORMAL LOW (ref 20.0–28.0)
O2 Saturation: 92.6 %
PH VEN: 7.012 — AB (ref 7.250–7.430)
PO2 VEN: 91.2 mmHg — AB (ref 32.0–45.0)
pCO2, Ven: 22.1 mmHg — ABNORMAL LOW (ref 44.0–60.0)

## 2018-04-25 LAB — URINALYSIS, ROUTINE W REFLEX MICROSCOPIC
BILIRUBIN URINE: NEGATIVE
Bacteria, UA: NONE SEEN
Bilirubin Urine: NEGATIVE
Glucose, UA: >=500 mg/dL — AB
Glucose, UA: >=500 mg/dL — AB
KETONES UR: 80 mg/dL — AB
Ketones, ur: 80 mg/dL — AB
LEUKOCYTES UA: NEGATIVE
LEUKOCYTES UA: NEGATIVE
NITRITE: NEGATIVE
Nitrite: NEGATIVE
PH: 5 (ref 5.0–8.0)
PH: 5 (ref 5.0–8.0)
PROTEIN: NEGATIVE mg/dL
Protein, ur: NEGATIVE mg/dL
SPECIFIC GRAVITY, URINE: 1.017 (ref 1.005–1.030)
Specific Gravity, Urine: 1.018 (ref 1.005–1.030)

## 2018-04-25 LAB — BASIC METABOLIC PANEL
ANION GAP: 17 — AB (ref 5–15)
ANION GAP: 6 (ref 5–15)
ANION GAP: 7 (ref 5–15)
BUN: 18 mg/dL (ref 6–20)
BUN: 12 mg/dL (ref 6–20)
BUN: 11 mg/dL (ref 6–20)
CALCIUM: 7 mg/dL — AB (ref 8.9–10.3)
CALCIUM: 6.9 mg/dL — AB (ref 8.9–10.3)
CALCIUM: 7.2 mg/dL — AB (ref 8.9–10.3)
CO2: 11 mmol/L — AB (ref 22–32)
CO2: 19 mmol/L — ABNORMAL LOW (ref 22–32)
CO2: 20 mmol/L — ABNORMAL LOW (ref 22–32)
Chloride: 115 mmol/L — ABNORMAL HIGH (ref 98–111)
Chloride: 118 mmol/L — ABNORMAL HIGH (ref 98–111)
Chloride: 114 mmol/L — ABNORMAL HIGH (ref 98–111)
Creatinine, Ser: 1.69 mg/dL — ABNORMAL HIGH (ref 0.44–1.00)
Creatinine, Ser: 1.11 mg/dL — ABNORMAL HIGH (ref 0.44–1.00)
Creatinine, Ser: 0.98 mg/dL (ref 0.44–1.00)
GFR calc Af Amer: >60 mL/min (ref >60)
GFR, EST AFRICAN AMERICAN: 43 mL/min — AB (ref >60)
GFR, EST NON AFRICAN AMERICAN: 37 mL/min — AB (ref >60)
GLUCOSE: 438 mg/dL — AB (ref 70–99)
GLUCOSE: 251 mg/dL — AB (ref 70–99)
GLUCOSE: 87 mg/dL (ref 70–99)
POTASSIUM: 4.2 mmol/L (ref 3.5–5.1)
Potassium: 3.4 mmol/L — ABNORMAL LOW (ref 3.5–5.1)
Potassium: 3.9 mmol/L (ref 3.5–5.1)
Sodium: 143 mmol/L (ref 135–145)
Sodium: 143 mmol/L (ref 135–145)
Sodium: 141 mmol/L (ref 135–145)

## 2018-04-25 LAB — COMPREHENSIVE METABOLIC PANEL
ALK PHOS: 158 U/L — AB (ref 38–126)
ALT: 39 U/L (ref 0–44)
AST: 21 U/L (ref 15–41)
Albumin: 3.2 g/dL — ABNORMAL LOW (ref 3.5–5.0)
Anion gap: >20 — ABNORMAL HIGH (ref 5–15)
BILIRUBIN TOTAL: 2.7 mg/dL — AB (ref 0.3–1.2)
BUN: 31 mg/dL — AB (ref 6–20)
CO2: 7 mmol/L — ABNORMAL LOW (ref 22–32)
CREATININE: 1.92 mg/dL — AB (ref 0.44–1.00)
Calcium: 7.7 mg/dL — ABNORMAL LOW (ref 8.9–10.3)
Chloride: 91 mmol/L — ABNORMAL LOW (ref 98–111)
GFR calc Af Amer: 37 mL/min — ABNORMAL LOW (ref >60)
GFR calc non Af Amer: 32 mL/min — ABNORMAL LOW (ref >60)
Glucose, Bld: 1218 mg/dL (ref 70–99)
Potassium: 5.8 mmol/L — ABNORMAL HIGH (ref 3.5–5.1)
Sodium: 129 mmol/L — ABNORMAL LOW (ref 135–145)
TOTAL PROTEIN: 6.4 g/dL — AB (ref 6.5–8.1)

## 2018-04-25 LAB — CBC WITH DIFFERENTIAL/PLATELET
BASOS ABS: 0 10*3/uL (ref 0.0–0.1)
Basophils Relative: 0 %
Eosinophils Absolute: 0 10*3/uL (ref 0.0–0.7)
Eosinophils Relative: 0 %
HEMATOCRIT: 34.5 % — AB (ref 36.0–46.0)
Hemoglobin: 9.5 g/dL — ABNORMAL LOW (ref 12.0–15.0)
LYMPHS PCT: 7 %
Lymphs Abs: 1.3 10*3/uL (ref 0.7–4.0)
MCH: 29 pg (ref 26.0–34.0)
MCHC: 27.5 g/dL — ABNORMAL LOW (ref 30.0–36.0)
MCV: 105.2 fL — ABNORMAL HIGH (ref 78.0–100.0)
Monocytes Absolute: 1.2 10*3/uL — ABNORMAL HIGH (ref 0.1–1.0)
Monocytes Relative: 7 %
NEUTROS ABS: 15.4 10*3/uL (ref 1.7–7.7)
Neutrophils Relative %: 86 %
Platelets: 423 10*3/uL — ABNORMAL HIGH (ref 150–400)
RBC: 3.28 MIL/uL — AB (ref 3.87–5.11)
RDW: 14.4 % (ref 11.5–15.5)
WBC: 17.9 10*3/uL — AB (ref 4.0–10.5)

## 2018-04-25 LAB — CBC
HEMATOCRIT: 28.4 % — AB (ref 36.0–46.0)
HEMOGLOBIN: 9.2 g/dL — AB (ref 12.0–15.0)
MCH: 29.7 pg (ref 26.0–34.0)
MCHC: 32.4 g/dL (ref 30.0–36.0)
MCV: 91.6 fL (ref 78.0–100.0)
Platelets: 441 10*3/uL — ABNORMAL HIGH (ref 150–400)
RBC: 3.1 MIL/uL — AB (ref 3.87–5.11)
RDW: 14.3 % (ref 11.5–15.5)
WBC: 26.3 10*3/uL — AB (ref 4.0–10.5)

## 2018-04-25 LAB — RAPID URINE DRUG SCREEN, HOSP PERFORMED
Amphetamines: NOT DETECTED
Barbiturates: NOT DETECTED
Benzodiazepines: NOT DETECTED
Cocaine: NOT DETECTED
OPIATES: NOT DETECTED
TETRAHYDROCANNABINOL: NOT DETECTED

## 2018-04-25 LAB — PHOSPHORUS: PHOSPHORUS: 2.7 mg/dL (ref 2.5–4.6)

## 2018-04-25 LAB — CBG MONITORING, ED
Glucose-Capillary: >600 mg/dL (ref 70–99)
Glucose-Capillary: >600 mg/dL (ref 70–99)
Glucose-Capillary: >600 mg/dL (ref 70–99)
Glucose-Capillary: >600 mg/dL (ref 70–99)

## 2018-04-25 LAB — I-STAT CHEM 8, ED
BUN: 27 mg/dL — AB (ref 6–20)
CREATININE: 1.2 mg/dL — AB (ref 0.44–1.00)
Calcium, Ion: 1.1 mmol/L — ABNORMAL LOW (ref 1.15–1.40)
Chloride: 95 mmol/L — ABNORMAL LOW (ref 98–111)
Glucose, Bld: >700 mg/dL (ref 70–99)
HCT: 31 % — ABNORMAL LOW (ref 36.0–46.0)
Hemoglobin: 10.5 g/dL — ABNORMAL LOW (ref 12.0–15.0)
Potassium: 5.8 mmol/L — ABNORMAL HIGH (ref 3.5–5.1)
Sodium: 129 mmol/L — ABNORMAL LOW (ref 135–145)
TCO2: 8 mmol/L — ABNORMAL LOW (ref 22–32)

## 2018-04-25 LAB — I-STAT CG4 LACTIC ACID, ED: LACTIC ACID, VENOUS: 2.58 mmol/L — AB (ref 0.5–1.9)

## 2018-04-25 LAB — I-STAT BETA HCG BLOOD, ED (MC, WL, AP ONLY)

## 2018-04-25 LAB — TROPONIN I: Troponin I: <0.03 ng/mL (ref <0.03)

## 2018-04-25 LAB — MRSA PCR SCREENING: MRSA BY PCR: NEGATIVE

## 2018-04-25 LAB — MAGNESIUM: MAGNESIUM: 1.8 mg/dL (ref 1.7–2.4)

## 2018-04-25 LAB — BETA-HYDROXYBUTYRIC ACID: BETA-HYDROXYBUTYRIC ACID: 2.56 mmol/L — AB (ref 0.05–0.27)

## 2018-04-25 LAB — PROCALCITONIN: PROCALCITONIN: 1.42 ng/mL

## 2018-04-25 MED ORDER — SODIUM CHLORIDE 0.9 % IV BOLUS: 1000.0000 mL | Freq: Once | INTRAVENOUS | Status: DC

## 2018-04-25 MED ORDER — SODIUM CHLORIDE 0.9 % IV SOLN
INTRAVENOUS | Status: DC
  Administered 2018-04-25: 10 mL/h via INTRAVENOUS

## 2018-04-25 MED ORDER — NOREPINEPHRINE 4 MG/250ML-% IV SOLN
0.0000 ug/min | INTRAVENOUS | Status: DC
  Administered 2018-04-25: 16 ug/min via INTRAVENOUS
  Administered 2018-04-25: 5 ug/min via INTRAVENOUS
  Administered 2018-04-25: 22.5 ug/min via INTRAVENOUS
  Filled 2018-04-25 (×2): qty 250

## 2018-04-25 MED ORDER — SODIUM CHLORIDE 0.9 % IV BOLUS
1000.0000 mL | Freq: Once | INTRAVENOUS | Status: AC
  Administered 2018-04-25: 1000 mL via INTRAVENOUS

## 2018-04-25 MED ORDER — SODIUM CHLORIDE 0.9 % IV SOLN: INTRAVENOUS | Status: DC

## 2018-04-25 MED ORDER — SODIUM CHLORIDE 0.9 % IV BOLUS
500.0000 mL | Freq: Once | INTRAVENOUS | Status: AC
  Administered 2018-04-25: 500 mL via INTRAVENOUS

## 2018-04-25 MED ORDER — PIPERACILLIN-TAZOBACTAM 3.375 G IVPB
3.3750 g | Freq: Three times a day (TID) | INTRAVENOUS | Status: DC
  Administered 2018-04-25 – 2018-04-27 (×7): 3.375 g via INTRAVENOUS
  Filled 2018-04-25 (×7): qty 50

## 2018-04-25 MED ORDER — SODIUM CHLORIDE 0.9 % IV SOLN
INTRAVENOUS | Status: DC
  Administered 2018-04-25: 07:00:00 via INTRAVENOUS

## 2018-04-25 MED ORDER — INSULIN DETEMIR 100 UNIT/ML ~~LOC~~ SOLN
20.0000 [IU] | Freq: Two times a day (BID) | SUBCUTANEOUS | Status: DC
  Administered 2018-04-25 – 2018-04-26 (×2): 20 [IU] via SUBCUTANEOUS
  Filled 2018-04-25 (×5): qty 0.2

## 2018-04-25 MED ORDER — ONDANSETRON HCL 4 MG/2ML IJ SOLN
4.0000 mg | Freq: Once | INTRAMUSCULAR | Status: AC
Start: 2018-04-25 — End: 2018-04-25

## 2018-04-25 MED ORDER — VANCOMYCIN HCL IN DEXTROSE 1-5 GM/200ML-% IV SOLN
1000.0000 mg | Freq: Once | INTRAVENOUS | Status: AC
  Administered 2018-04-25: 1000 mg via INTRAVENOUS
  Filled 2018-04-25: qty 200

## 2018-04-25 MED ORDER — SODIUM CHLORIDE 0.9 % IV SOLN
INTRAVENOUS | Status: DC
  Administered 2018-04-25: 09:00:00 via INTRAVENOUS

## 2018-04-25 MED ORDER — SODIUM CHLORIDE 0.9 % IV SOLN: INTRAVENOUS | Status: AC

## 2018-04-25 MED ORDER — ENOXAPARIN SODIUM 40 MG/0.4ML ~~LOC~~ SOLN
40.0000 mg | SUBCUTANEOUS | Status: DC
  Administered 2018-04-25 – 2018-04-29 (×5): 40 mg via SUBCUTANEOUS
  Filled 2018-04-25 (×5): qty 0.4

## 2018-04-25 MED ORDER — PHENTOLAMINE MESYLATE 5 MG IJ SOLR
10.0000 mg | Freq: Once | INTRAMUSCULAR | Status: AC
  Administered 2018-04-25: 10 mg via SUBCUTANEOUS
  Filled 2018-04-25: qty 10

## 2018-04-25 MED ORDER — ONDANSETRON HCL 4 MG/2ML IJ SOLN: 4.0000 mg | Freq: Four times a day (QID) | INTRAMUSCULAR | Status: DC

## 2018-04-25 MED ORDER — VANCOMYCIN HCL IN DEXTROSE 1-5 GM/200ML-% IV SOLN: 1000.0000 mg | Freq: Once | INTRAVENOUS | Status: DC

## 2018-04-25 MED ORDER — SODIUM CHLORIDE 0.45 % IV SOLN
INTRAVENOUS | Status: DC
  Administered 2018-04-25: 17:00:00 via INTRAVENOUS

## 2018-04-25 MED ORDER — ONDANSETRON HCL 4 MG/2ML IJ SOLN
INTRAMUSCULAR | Status: AC
  Filled 2018-04-25: qty 2

## 2018-04-25 MED ORDER — SODIUM CHLORIDE 0.9 % IV SOLN
INTRAVENOUS | Status: DC
  Administered 2018-04-25: 36.3 [IU]/h via INTRAVENOUS
  Administered 2018-04-25: 4.4 [IU]/h via INTRAVENOUS
  Filled 2018-04-25 (×4): qty 1

## 2018-04-25 MED ORDER — ONDANSETRON HCL 4 MG/2ML IJ SOLN
4.0000 mg | Freq: Once | INTRAMUSCULAR | Status: AC
  Administered 2018-04-25 (×2): 4 mg via INTRAVENOUS

## 2018-04-25 MED ORDER — INSULIN ASPART 100 UNIT/ML ~~LOC~~ SOLN
3.0000 [IU] | SUBCUTANEOUS | Status: DC
  Administered 2018-04-26: 6 [IU] via SUBCUTANEOUS
  Administered 2018-04-26 – 2018-04-27 (×2): 3 [IU] via SUBCUTANEOUS
  Administered 2018-04-28: 9 [IU] via SUBCUTANEOUS
  Administered 2018-04-29: 6 [IU] via SUBCUTANEOUS
  Administered 2018-04-29: 9 [IU] via SUBCUTANEOUS

## 2018-04-25 MED ORDER — ONDANSETRON HCL 4 MG/5ML PO SOLN
4.0000 mg | Freq: Once | ORAL | Status: AC
  Filled 2018-04-25: qty 5

## 2018-04-25 MED ORDER — MUPIROCIN CALCIUM 2 % EX CREA
TOPICAL_CREAM | Freq: Every day | CUTANEOUS | Status: DC
  Administered 2018-04-25 – 2018-04-29 (×5): via TOPICAL
  Filled 2018-04-25: qty 15

## 2018-04-25 MED ORDER — DEXTROSE-NACL 5-0.45 % IV SOLN
INTRAVENOUS | Status: DC
  Administered 2018-04-25: 14:00:00 via INTRAVENOUS

## 2018-04-25 MED ORDER — POTASSIUM CHLORIDE 10 MEQ/50ML IV SOLN
10.0000 meq | INTRAVENOUS | Status: AC
  Administered 2018-04-25 (×2): 10 meq via INTRAVENOUS
  Filled 2018-04-25 (×3): qty 50

## 2018-04-25 MED ORDER — ONDANSETRON HCL 4 MG/2ML IJ SOLN
4.0000 mg | Freq: Once | INTRAMUSCULAR | Status: AC
  Administered 2018-04-25: 4 mg via INTRAVENOUS
  Filled 2018-04-25: qty 2

## 2018-04-25 MED ORDER — PIPERACILLIN-TAZOBACTAM 3.375 G IVPB 30 MIN
3.3750 g | Freq: Once | INTRAVENOUS | Status: AC
  Administered 2018-04-25: 3.375 g via INTRAVENOUS
  Filled 2018-04-25: qty 50

## 2018-04-25 MED ORDER — DEXTROSE-NACL 5-0.45 % IV SOLN: INTRAVENOUS | Status: DC

## 2018-04-25 MED ORDER — ONDANSETRON HCL 4 MG/2ML IJ SOLN: 4.0000 mg | Freq: Four times a day (QID) | INTRAMUSCULAR | Status: DC | PRN

## 2018-04-25 MED ORDER — PIPERACILLIN-TAZOBACTAM 3.375 G IVPB 30 MIN: 3.3750 g | Freq: Once | INTRAVENOUS | Status: DC

## 2018-04-25 MED ORDER — PIPERACILLIN-TAZOBACTAM 3.375 G IVPB
INTRAVENOUS | Status: AC
  Filled 2018-04-25: qty 50

## 2018-04-25 MED ORDER — NOREPINEPHRINE 4 MG/250ML-% IV SOLN
INTRAVENOUS | Status: AC
  Filled 2018-04-25: qty 250

## 2018-04-25 MED ORDER — SODIUM CHLORIDE 0.9 % IV SOLN
INTRAVENOUS | Status: AC
  Filled 2018-04-25: qty 1

## 2018-04-25 MED ORDER — VANCOMYCIN HCL IN DEXTROSE 1-5 GM/200ML-% IV SOLN
INTRAVENOUS | Status: AC
  Filled 2018-04-25: qty 200

## 2018-04-25 MED ORDER — VANCOMYCIN HCL IN DEXTROSE 1-5 GM/200ML-% IV SOLN
1000.0000 mg | INTRAVENOUS | Status: DC
  Administered 2018-04-26: 1000 mg via INTRAVENOUS
  Filled 2018-04-25: qty 200

## 2018-04-25 NOTE — Progress Notes (Signed)
Inpatient Diabetes Program Recommendations  AACE/ADA: New Consensus Statement on Inpatient Glycemic Control (2015)  Target Ranges:  Prepandial:   less than 140 mg/dL      Peak postprandial:   less than 180 mg/dL (1-2 hours)      Critically ill patients:  140 - 180 mg/dL    Review of Glycemic Control  Diabetes history: DM 1, followed by PCP Outpatient Diabetes medications: Lantus 60 units BID, Apidra 10-20 units tid Current orders for Inpatient glycemic control: IV Insulin gtt per DKA protocol  Inpatient Diabetes Program Recommendations:    Last A1c in Care Everywhere 11.1% obtained on 11/30/17, very uncontrolled at home. Please consider an A1c level to assess glucose control over the past 2-3 months.   Will evaluate patient today to inquire about self management, sick day guidelines, and need for Endocrinology visit.  Thanks,  Christena DeemShannon Jessee Mezera RN, MSN, BC-ADM Inpatient Diabetes Coordinator Team Pager [phone] (8a-5p)

## 2018-04-25 NOTE — Procedures (Signed)
Central Venous Catheter Insertion Procedure Note Hughie ClossJennifer Soyars-Martin [id number] 07/28/1981  Procedure: Insertion of Central Venous Catheter Indications: Assessment of intravascular volume, Drug and/or fluid administration and Frequent blood sampling  Procedure Details Consent: Unable to obtain consent because of altered level of consciousness. Time Out: Verified patient identification, verified procedure, site/side was marked, verified correct patient position, special equipment/implants available, medications/allergies/relevent history reviewed, required imaging and test results available.  Performed  Maximum sterile technique was used including antiseptics, cap, gloves, gown, hand hygiene, mask and sheet. Skin prep: Chlorhexidine; local anesthetic administered A antimicrobial bonded/coated triple lumen catheter was placed in the left internal jugular vein using the Seldinger technique.  Evaluation Blood flow good Complications: No apparent complications Patient did tolerate procedure well. Chest X-ray ordered to verify placement.  CXR: pending.  Procedure performed under direct ultrasound guidance for real time vessel cannulation.      Rutherford Guysahul Gilberte Gorley, [place] - C [facility] Pager: [phone]  or [phone] 04/25/2018, 9:23 AM
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
## 2018-04-25 NOTE — ED Notes (Addendum)
CRITICAL VALUE ALERT  Critical Value:  Glucose 1218  Date & Time Notied:  04/25/18 0300  Provider Notified: EDP Rancour   Orders Received/Actions taken: 3 L of fluid within next hour

## 2018-04-25 NOTE — Progress Notes (Signed)
Pharmacy Antibiotic Note  [name] is a 37 y.o. female admitted on 04/25/2018 with sepsis.  Plan: Continue zosyn  Continue vanc 1 g q24h Monitor renal fx cx vt prn F/U PCT  Height: 5\' 4"  (162.6 cm) Weight: 144 lb 10 oz (65.6 kg) IBW/kg (Calculated) : 54.7  Temp (24hrs), Avg:96.9 F (36.1 C), Min:96.9 F (36.1 C), Max:96.9 F (36.1 C)  Recent Labs  Lab 04/25/18 0141 04/25/18 0142 04/25/18 0143  WBC  --   --  17.9*  CREATININE 1.20*  --  1.92*  LATICACIDVEN  --  2.58*  --     Estimated Creatinine Clearance: 34 mL/min (A) (by C-G formula based on SCr of 1.92 mg/dL (H)).    Allergies  Allergen Reactions  . Morphine Itching  . Codeine Nausea And Vomiting  . Sulfa Antibiotics Nausea And Vomiting   Isaac BlissMichael Shellene Sweigert, PharmD, BCPS, BCCCP Clinical Pharmacist [phone]  Please check AMION for all [facility] numbers  04/25/2018 8:29 AM
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
## 2018-04-25 NOTE — ED Triage Notes (Signed)
Pt c/o high blood sugar with vomiting. Ems states their meter is reading high.

## 2018-04-25 NOTE — Consult Note (Addendum)
WOC Nurse wound consult note Reason for Consult: Consult requested for right foot Wound type: Chronic full thickness wound; pt states she is followed by a [facility]iat[facility] (Dr Bennett ScrapeVogler) prior to admission, but is unable to name the topical treatment she was using. Measurement: .3X.5X.3cm Wound bed: dark red and dry Drainage (amount, consistency, odor) no odor, drainage, or fluctuance Periwound: intact skin surrounding Dressing procedure/placement/frequency: Bactroban to promote moist healing and provide antimicrobial benefits.  Foam dressing to protect from further injury.  Discussed plan of care with patient and family member at the bedside.  Pt plans to follow-up with her podiatrist after discharge and resume previous plan of care. Please re-consult if further assistance is needed.  Thank-you,  [name] Mcgeeawn Cason Dabney MSN, RN, CWOCN, [place]CN-AP, CNS [phone]

## 2018-04-26 DIAGNOSIS — G9341 Metabolic encephalopathy: Secondary | ICD-10-CM

## 2018-04-26 DIAGNOSIS — A419 Sepsis, unspecified organism: Principal | ICD-10-CM

## 2018-04-26 DIAGNOSIS — E11628 Type 2 diabetes mellitus with other skin complications: Secondary | ICD-10-CM

## 2018-04-26 DIAGNOSIS — L089 Local infection of the skin and subcutaneous tissue, unspecified: Secondary | ICD-10-CM

## 2018-04-26 DIAGNOSIS — R6521 Severe sepsis with septic shock: Secondary | ICD-10-CM

## 2018-04-26 LAB — GLUCOSE, CAPILLARY
GLUCOSE-CAPILLARY: 118 mg/dL — AB (ref 70–99)
GLUCOSE-CAPILLARY: 123 mg/dL — AB (ref 70–99)
GLUCOSE-CAPILLARY: 54 mg/dL — AB (ref 70–99)
GLUCOSE-CAPILLARY: 66 mg/dL — AB (ref 70–99)
GLUCOSE-CAPILLARY: 96 mg/dL (ref 70–99)
GLUCOSE-CAPILLARY: 98 mg/dL (ref 70–99)
Glucose-Capillary: 109 mg/dL — ABNORMAL HIGH (ref 70–99)
Glucose-Capillary: 142 mg/dL — ABNORMAL HIGH (ref 70–99)
Glucose-Capillary: 199 mg/dL — ABNORMAL HIGH (ref 70–99)

## 2018-04-26 LAB — BASIC METABOLIC PANEL
Anion gap: 8 (ref 5–15)
BUN: 10 mg/dL (ref 6–20)
CALCIUM: 7.6 mg/dL — AB (ref 8.9–10.3)
CO2: 21 mmol/L — ABNORMAL LOW (ref 22–32)
CREATININE: 0.88 mg/dL (ref 0.44–1.00)
Chloride: 115 mmol/L — ABNORMAL HIGH (ref 98–111)
Glucose, Bld: 61 mg/dL — ABNORMAL LOW (ref 70–99)
Potassium: 3.5 mmol/L (ref 3.5–5.1)
SODIUM: 144 mmol/L (ref 135–145)

## 2018-04-26 LAB — PROCALCITONIN: Procalcitonin: 1.07 ng/mL

## 2018-04-26 LAB — HIV ANTIBODY (ROUTINE TESTING W REFLEX): HIV Screen 4th Generation wRfx: NONREACTIVE

## 2018-04-26 MED ORDER — DEXTROSE 50 % IV SOLN
INTRAVENOUS | Status: AC
  Administered 2018-04-26: 25 mL via INTRAVENOUS
  Filled 2018-04-26: qty 50

## 2018-04-26 MED ORDER — DEXTROSE-NACL 5-0.45 % IV SOLN
INTRAVENOUS | Status: DC
  Administered 2018-04-26 – 2018-04-27 (×3): via INTRAVENOUS

## 2018-04-26 MED ORDER — POTASSIUM CHLORIDE CRYS ER 20 MEQ PO TBCR
20.0000 meq | EXTENDED_RELEASE_TABLET | ORAL | Status: AC
  Administered 2018-04-26 (×2): 20 meq via ORAL
  Filled 2018-04-26 (×2): qty 1

## 2018-04-26 MED ORDER — VANCOMYCIN HCL IN DEXTROSE 750-5 MG/150ML-% IV SOLN
750.0000 mg | Freq: Two times a day (BID) | INTRAVENOUS | Status: DC
  Administered 2018-04-26 – 2018-04-27 (×2): 750 mg via INTRAVENOUS
  Filled 2018-04-26 (×3): qty 150

## 2018-04-26 MED ORDER — SODIUM CHLORIDE 0.9 % IV SOLN
INTRAVENOUS | Status: DC
  Administered 2018-04-26: 16:00:00 via INTRAVENOUS

## 2018-04-26 MED ORDER — DEXTROSE 50 % IV SOLN
25.0000 mL | Freq: Once | INTRAVENOUS | Status: AC
  Administered 2018-04-26: 25 mL via INTRAVENOUS

## 2018-04-26 NOTE — Progress Notes (Signed)
[facility] REPLAC[facility] PROTOCOL FOR AM LAB REPLACEMENT ONLY  The patient does apply for the [facility] Adult [facility] Electrolyte Replacment Protocol based on the criteria listed below:   1. Is GFR >/= 40 ml/min? Yes.    Patient's GFR today is >60 2. Is urine output >/= 0.5 ml/kg/hr for the last 6 hours? Yes.   Patient's UOP is .7 ml/kg/hr 3. Is BUN < 60 mg/dL? Yes.    Patient's BUN today is 10 4. Abnormal electrolyte(s): K-3.5 5. Ordered repletion with: per protocol 6. If a panic level lab has been reported, has the CCM MD in charge been notified? Yes.  .   Physician:  Dr. Juline PatchYacoub  Emaleigh Guimond, Dixon BoosMaria Samson 04/26/2018 5:33 AM

## 2018-04-26 NOTE — Progress Notes (Addendum)
Pt transferred from ICU to 5west bed 23. Pt oriented to room, call bell, bed controls. Pt currently denies pain except for pain in throat - she sounds hoarse and states it is from throwing up. Left IJ line in place, dressing c/d/i, infusing IVF and IV Zosyn (see MAR). Pt also has two peripheral IVs. Discussed plan of care with patient including timeline to removal of central line when medically indicated, IV antibiotics, monitoring CBG and VS, dressing changes to foot. Per bedside report from ICU nurse, mother has been notified of change of location. Pt eating tray, on clear liquid diet at this time.  BP (!) 139/96 (BP Location: Left Arm)   Pulse 90   Temp 98.2 F (36.8 C) (Oral)   Resp 18   Ht 5\' 4"  (1.626 m)   Wt 65.6 kg   LMP 04/01/2018   SpO2 99%   BMI 24.82 kg/m

## 2018-04-26 NOTE — Progress Notes (Signed)
Brief Nutrition Education Note  RD consulted for nutrition education regarding diabetes.  Attempted to speak with pt x 2. Pt was asleep on first attempt and awake on second attempt but continued to fall asleep during conversation with RD. Pt with brief one-word answered to RD questions.  Pt is not appropriate for more comprehensive diabetes education at this time.  Provided pt with "Carbohydrate Counting for People with Diabetes" handout from the Academy of Nutrition and Dietetics. Let pt and family member know to contact RN if any questions arise so that RD can return at a later date. MD can also re-consult at a later date.  Earma ReadingKate Jablonski Alazia Crocket, MS, RD, LDN Pager: [phone] Weekend/After Hours: [phone]

## 2018-04-26 NOTE — Progress Notes (Addendum)
PROGRESS NOTE  [name] WGN:[id number] DOB: [birth year] DOA: 04/25/2018 PCP: Remus Loffler, PA-C   Brief Summary:  DM1 with right foot wound, sent from [facility] due to severe DKA, n/v, septic /hypovolemic shock. sugars more than 1200, hypotensive with venous pH of 7.01 on presentation. She was fluid resuscitated with 7 L of fluid but remained hypotensive requiring Levophed which was started via left EJ.  Unfortunately this infiltrated. After arrival to [facility] ICU a central line was placed. She was lethargic initially  HPI/Recap of past 24 hours:  She is drowsy, but oriented x3, she replies " I do not remember" to many of the questions regarding her insulin regimen She has intermittent dry cough, she is hoarse No active n/v, no fever, she denies pain She is currently on 3liter oxygen  Assessment/Plan: Active Problems:   DKA (diabetic ketoacidoses) (HCC)  Severe DKA in a type I diabetic -improving, now off insulin drip -check a1c, titrate insulin  septic /hypovolemic shock with significant leukocytosis -fluid resuscitated with 7 L of fluid but remained hypotensive requiring Levophed on presentation -source likely right foot wound -mrsa screening negative, blood culture in process, ua with rare bacteria, cxr with edema and possible left lower lobe infiltrate ( she has n/v) -continue vanc/zosyn for now, follow up cultures  Acute Metabolic encephalopathy: UDS negative Improving, but not baseline yet  AKI:  Bu/cr 31/1.9 on presentation Iproving, bun/cr 10.0.8 today  Right foot wound -foot x ray : -1. Soft tissue ulceration along the plantar aspect of the midfoot with diffuse soft tissue swelling of the included ankle and foot. 2. 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 3. Osteopenia without acute radiographic evidence of osteomyelitis. -visual inspection of right foot wound, with  mild erythema, no odor  Pulmonary edema  7 liter fluids resuscitation  She is on lasix at home Will get echo  Anemia  hgb baseline appear to be at 11 hgb 10.5-9.9-9.2 She does not have blood loss, possible dilutional effect from massive fluids resuscitation Continue moitor   Code Status: full  Family Communication: patient   Disposition Plan: transfer out of ICU to med tele   Consultants:  Critical care  Procedures:  left IJ placement on 8/8  Antibiotics:  As above   Objective: BP (!) 140/92 (BP Location: Left Arm)   Pulse 90   Temp 98.4 F (36.9 C) (Oral)   Resp 17   Ht 5\' 4"  (1.626 m)   Wt 65.6 kg   LMP 04/01/2018   SpO2 97%   BMI 24.82 kg/m   Intake/Output Summary (Last 24 hours) at 04/26/2018 0739 Last data filed at 04/26/2018 0500 Gross per 24 hour  Intake 3719.64 ml  Output 1100 ml  Net 2619.64 ml   Filed Weights   04/25/18 0730  Weight: 65.6 kg    Exam: Patient is examined daily including today on 04/26/2018, exams remain the same as of yesterday except that has changed    General:  Drowsy, NAD  Cardiovascular: RRR  Respiratory: diminished, no wheezing, no rales  Abdomen: Soft/ND/NT, positive BS  Musculoskeletal: right foot edema, right foot wound on platar surface, mild erythema, no odor, no active drainage at time on inspection  Neuro: drowsy but  oriented x3  Data Reviewed: Basic Metabolic Panel: Recent Labs  Lab 04/25/18 0143 04/25/18 0847 04/25/18 1512 04/25/18 2112 04/26/18 0409  NA 129* 143 143 141 144  K 5.8*  3.4* 4.2 3.9 3.5  CL 91* 115* 118* 114* 115*  CO2 7* 11* 19* 20* 21*  GLUCOSE 1,218* 438* 251* 87 61*  BUN 31* 18 12 11 10   CREATININE 1.92* 1.69* 1.11* 0.98 0.88  CALCIUM 7.7* 7.0* 6.9* 7.2* 7.6*  MG  --  1.8  --   --   --   PHOS  --  2.7  --   --   --    Liver Function Tests: Recent Labs  Lab 04/25/18 0143  AST 21  ALT 39  ALKPHOS 158*  BILITOT 2.7*  PROT 6.4*  ALBUMIN 3.2*   No results for  input(s): LIPASE, AMYLASE in the last 168 hours. No results for input(s): AMMONIA in the last 168 hours. CBC: Recent Labs  Lab 04/25/18 0141 04/25/18 0143 04/25/18 0847  WBC  --  17.9* 26.3*  NEUTROABS  --  15.4  --   HGB 10.5* 9.5* 9.2*  HCT 31.0* 34.5* 28.4*  MCV  --  105.2* 91.6  PLT  --  423* 441*   Cardiac Enzymes:   Recent Labs  Lab 04/25/18 0143  TROPONINI <0.03   BNP (last 3 results) No results for input(s): BNP in the last 8760 hours.  ProBNP (last 3 results) No results for input(s): PROBNP in the last 8760 hours.  CBG: Recent Labs  Lab 04/26/18 0026 04/26/18 0406 04/26/18 0423 04/26/18 0536 04/26/18 0717  GLUCAP 98 54* 109* 123* 118*    Recent Results (from the past 240 hour(s))  MRSA PCR Screening     Status: None   Collection Time: 04/25/18  7:21 AM  Result Value Ref Range Status   MRSA by PCR NEGATIVE NEGATIVE Final    Comment:        The GeneXpert MRSA Assay (FDA approved for NASAL specimens only), is one component of a comprehensive MRSA colonization surveillance program. It is not intended to diagnose MRSA infection nor to guide or monitor treatment for MRSA infections. Performed at [facility], [address], [place], [place] [address]      Studies: Dg Chest Port 1 View  Result Date: 04/25/2018 CLINICAL DATA:  Status post central line placement EXAM: PORTABLE CHEST 1 VIEW COMPARISON:  8/8/9 FINDINGS: Left jugular central line placement has been performed. Catheter tip is noted in the mid superior vena cava. No pneumothorax is seen. The inspiratory effort is poor with bilateral lower lobe patchy infiltrates worse on the left than the right. IMPRESSION: No pneumothorax following central line placement. Patchy infiltrative change particularly in the left lung base. Electronically Signed   By: Alcide Clever M.D.   On: 04/25/2018 09:59    Scheduled Meds: . enoxaparin (LOVENOX) injection  40 mg Subcutaneous Q24H  . insulin aspart  3-9  Units Subcutaneous Q4H  . insulin detemir  20 Units Subcutaneous Q12H  . mupirocin cream   Topical Daily  . potassium chloride  20 mEq Oral Q4H    Continuous Infusions: . dextrose 5 % and 0.45% NaCl 100 mL/hr at 04/26/18 0500  . norepinephrine (LEVOPHED) Adult infusion Stopped (04/26/18 0110)  . piperacillin-tazobactam (ZOSYN)  IV Stopped (04/26/18 0419)  . sodium chloride    . sodium chloride    . vancomycin       Time spent: I have personally reviewed and interpreted on  04/26/2018 daily labs, tele strips, imagings as discussed above under date review session and assessment and plans.  I reviewed all nursing notes, pharmacy notes, consultant notes,  vitals,  pertinent old records  I have discussed plan of care as described above with RN , patient  on 04/26/2018   Albertine GratesFang Phil Michels MD, PhD  [facility] Pager [phone]. If 7PM-7AM, please contact night-coverage at [email or web address], password [email or web address] 04/26/2018, 7:39 AM  LOS: 1 day

## 2018-04-26 NOTE — Progress Notes (Signed)
Inpatient Diabetes Program Recommendations  AACE/ADA: New Consensus Statement on Inpatient Glycemic Control (2015)  Target Ranges:  Prepandial:   less than 140 mg/dL      Peak postprandial:   less than 180 mg/dL (1-2 hours)      Critically ill patients:  140 - 180 mg/dL   Spoke with patient at bedside about diabetes and home regimen for diabetes control. Patient reports that she is followed by an Endocrinologist, Kerry Kasshonda Rushton at an intensive [facility]. Patient reports taking Lantus 15 units and Novolog SSI with meals if she eats. Patient reports she does not eat breakfast sometimes and does not take her insulin as it is meal coverage and correction added into one scale.     Patient reports she learned how to count her carbs but she stated "I am not goo with numbers." Patien reported not covering her glucose the morning she got sick because she did not eat. Patient said she did try to cover at lunch time but patient was already vomiting and probably did not absorb the insulin.   Patient last saw her Endocrinologist in June and changes were made at that time. Her next appointment is in September. Encouraged patient to call the office and get an earlier appointment and to call the office for consistent elevated glucose levels.   Patient noticed the 2 days prior to getting sick her glucose trends increased. Patient reported not feeling ill. Spoke with patient about sick day guidelines and patient is not familiar with them. Spoke with patient about covering her glucose with a different scale when she is not able to eat to cover hyperglycemia. Patient will have to talk to her Endocrinologist for medication dosing for sick days.  Patient reports she just got insurance but it does not cover her insulin. Patient does not remember the name of the insurance. Patient has been getting samples from her Endocrinology office. Spoke with patient about a different insurance plan or a  supplement in order to cover her insulins and other medications.  Patient reports her Last A1c in June was better than her 11% ion March. Discussed glucose and A1C goals. Discussed importance of checking CBGs and maintaining good CBG control to prevent long-term and short-term complications. Explained how hyperglycemia leads to damage within blood vessels which lead to the common complications seen with uncontrolled diabetes. Stressed to the patient the importance of improving glycemic control to prevent further complications from uncontrolled diabetes.  Patient verbalized understanding of information discussed and has no further questions at this time related to diabetes.  Thanks,  Christena DeemShannon Linnette Panella RN, MSN, BC-ADM Inpatient Diabetes Coordinator Team Pager [phone] (8a-5p)

## 2018-04-26 NOTE — Progress Notes (Signed)
Pharmacy Antibiotic Note  [name] is a 37 y.o. female admitted on 04/25/2018 with sepsis. Renal fx has improved  Plan: Continue zosyn  Increase vanc 750 q12h Monitor renal fx cx vt prn F/U PCT  Height: 5\' 4"  (162.6 cm) Weight: 144 lb 10 oz (65.6 kg) IBW/kg (Calculated) : 54.7  Temp (24hrs), Avg:98.2 F (36.8 C), Min:97.7 F (36.5 C), Max:98.5 F (36.9 C)  Recent Labs  Lab 04/25/18 0142 04/25/18 0143 04/25/18 0847 04/25/18 1512 04/25/18 2112 04/26/18 0409  WBC  --  17.9* 26.3*  --   --   --   CREATININE  --  1.92* 1.69* 1.11* 0.98 0.88  LATICACIDVEN 2.58*  --   --   --   --   --     Estimated Creatinine Clearance: 74.1 mL/min (by C-G formula based on SCr of 0.88 mg/dL).    Allergies  Allergen Reactions  . Morphine Itching  . Codeine Nausea And Vomiting  . Sulfa Antibiotics Nausea And Vomiting   Isaac BlissMichael Geriann Lafont, PharmD, BCPS, BCCCP Clinical Pharmacist [phone]  Please check AMION for all [facility] Pharmacy numbers  04/26/2018 1:18 PM

## 2018-04-27 ENCOUNTER — Inpatient Hospital Stay (HOSPITAL_COMMUNITY): Payer: Self-pay

## 2018-04-27 DIAGNOSIS — I340 Nonrheumatic mitral (valve) insufficiency: Secondary | ICD-10-CM

## 2018-04-27 LAB — CBC WITH DIFFERENTIAL/PLATELET
Abs Immature Granulocytes: 0.1 10*3/uL (ref 0.0–0.1)
BASOS ABS: 0 10*3/uL (ref 0.0–0.1)
Basophils Relative: 0 %
EOS ABS: 0 10*3/uL (ref 0.0–0.7)
EOS PCT: 0 %
HEMATOCRIT: 29 % — AB (ref 36.0–46.0)
Hemoglobin: 9.7 g/dL — ABNORMAL LOW (ref 12.0–15.0)
Immature Granulocytes: 1 %
Lymphocytes Relative: 16 %
Lymphs Abs: 1.9 10*3/uL (ref 0.7–4.0)
MCH: 29.8 pg (ref 26.0–34.0)
MCHC: 33.4 g/dL (ref 30.0–36.0)
MCV: 89.2 fL (ref 78.0–100.0)
MONO ABS: 0.8 10*3/uL (ref 0.1–1.0)
Monocytes Relative: 7 %
Neutro Abs: 9 10*3/uL — ABNORMAL HIGH (ref 1.7–7.7)
Neutrophils Relative %: 76 %
Platelets: 278 10*3/uL (ref 150–400)
RBC: 3.25 MIL/uL — ABNORMAL LOW (ref 3.87–5.11)
RDW: 15.7 % — ABNORMAL HIGH (ref 11.5–15.5)
WBC: 11.7 10*3/uL — ABNORMAL HIGH (ref 4.0–10.5)

## 2018-04-27 LAB — GLUCOSE, CAPILLARY
GLUCOSE-CAPILLARY: 141 mg/dL — AB (ref 70–99)
GLUCOSE-CAPILLARY: 41 mg/dL — AB (ref 70–99)
GLUCOSE-CAPILLARY: 69 mg/dL — AB (ref 70–99)
GLUCOSE-CAPILLARY: 74 mg/dL (ref 70–99)
GLUCOSE-CAPILLARY: 75 mg/dL (ref 70–99)
Glucose-Capillary: 57 mg/dL — ABNORMAL LOW (ref 70–99)
Glucose-Capillary: 131 mg/dL — ABNORMAL HIGH (ref 70–99)
Glucose-Capillary: 179 mg/dL — ABNORMAL HIGH (ref 70–99)

## 2018-04-27 LAB — C DIFFICILE QUICK SCREEN W PCR REFLEX
C Diff antigen: NEGATIVE
C Diff interpretation: NOT DETECTED
C Diff toxin: NEGATIVE

## 2018-04-27 LAB — HEPATIC FUNCTION PANEL
ALT: 34 U/L (ref 0–44)
AST: 43 U/L — ABNORMAL HIGH (ref 15–41)
Albumin: 2.5 g/dL — ABNORMAL LOW (ref 3.5–5.0)
Alkaline Phosphatase: 112 U/L (ref 38–126)
BILIRUBIN TOTAL: 0.3 mg/dL (ref 0.3–1.2)
Bilirubin, Direct: <0.1 mg/dL (ref 0.0–0.2)
TOTAL PROTEIN: 5.4 g/dL — AB (ref 6.5–8.1)

## 2018-04-27 LAB — PROCALCITONIN: PROCALCITONIN: 0.36 ng/mL

## 2018-04-27 LAB — ECHOCARDIOGRAM COMPLETE
HEIGHTINCHES: 64 in
WEIGHTICAEL: 2313.95 [oz_av]

## 2018-04-27 LAB — BASIC METABOLIC PANEL
Anion gap: 8 (ref 5–15)
CO2: 23 mmol/L (ref 22–32)
CREATININE: 0.72 mg/dL (ref 0.44–1.00)
Calcium: 7.8 mg/dL — ABNORMAL LOW (ref 8.9–10.3)
Chloride: 107 mmol/L (ref 98–111)
GFR calc Af Amer: >60 mL/min (ref >60)
GLUCOSE: 111 mg/dL — AB (ref 70–99)
Potassium: 2.9 mmol/L — ABNORMAL LOW (ref 3.5–5.1)
Sodium: 138 mmol/L (ref 135–145)

## 2018-04-27 LAB — HEMOGLOBIN A1C
Hgb A1c MFr Bld: 11.2 % — ABNORMAL HIGH (ref 4.8–5.6)
Mean Plasma Glucose: 274.74 mg/dL

## 2018-04-27 LAB — LACTIC ACID, PLASMA: Lactic Acid, Venous: 2.9 mmol/L (ref 0.5–1.9)

## 2018-04-27 MED ORDER — METRONIDAZOLE 500 MG PO TABS
500.0000 mg | ORAL_TABLET | Freq: Three times a day (TID) | ORAL | Status: DC
  Administered 2018-04-27 – 2018-04-29 (×6): 500 mg via ORAL
  Filled 2018-04-27 (×6): qty 1

## 2018-04-27 MED ORDER — MAGNESIUM SULFATE 2 GM/50ML IV SOLN
2.0000 g | Freq: Once | INTRAVENOUS | Status: AC
  Administered 2018-04-27: 2 g via INTRAVENOUS
  Filled 2018-04-27: qty 50

## 2018-04-27 MED ORDER — INSULIN DETEMIR 100 UNIT/ML ~~LOC~~ SOLN
5.0000 [IU] | Freq: Two times a day (BID) | SUBCUTANEOUS | Status: DC
  Administered 2018-04-28 (×3): 5 [IU] via SUBCUTANEOUS
  Filled 2018-04-27 (×5): qty 0.05

## 2018-04-27 MED ORDER — CIPROFLOXACIN HCL 500 MG PO TABS
500.0000 mg | ORAL_TABLET | Freq: Two times a day (BID) | ORAL | Status: DC
  Administered 2018-04-27 – 2018-04-29 (×4): 500 mg via ORAL
  Filled 2018-04-27 (×4): qty 1

## 2018-04-27 MED ORDER — PHENOL 1.4 % MT LIQD
1.0000 | OROMUCOSAL | Status: DC | PRN
  Administered 2018-04-27: 1 via OROMUCOSAL
  Filled 2018-04-27: qty 177

## 2018-04-27 MED ORDER — SACCHAROMYCES BOULARDII 250 MG PO CAPS
250.0000 mg | ORAL_CAPSULE | Freq: Two times a day (BID) | ORAL | Status: DC
  Administered 2018-04-27 – 2018-04-29 (×5): 250 mg via ORAL
  Filled 2018-04-27 (×5): qty 1

## 2018-04-27 MED ORDER — DEXTROSE 50 % IV SOLN
INTRAVENOUS | Status: AC
  Administered 2018-04-27: 25 mL
  Filled 2018-04-27: qty 50

## 2018-04-27 MED ORDER — POTASSIUM CHLORIDE CRYS ER 20 MEQ PO TBCR
40.0000 meq | EXTENDED_RELEASE_TABLET | ORAL | Status: AC
  Administered 2018-04-27 (×2): 40 meq via ORAL
  Filled 2018-04-27 (×2): qty 2

## 2018-04-27 NOTE — Progress Notes (Signed)
CRITICAL VALUE ALERT  Critical Value:  2.9  Date & Time Notied:  04/27/18 at 7:23  Provider Notified: Roda ShuttersXu, MD  Orders Received/Actions taken: Awaiting new orders

## 2018-04-27 NOTE — Progress Notes (Addendum)
PROGRESS NOTE  [name] ClossJennifer [name] OZH:[id number] DOB: 01/26/1981 DOA: 04/25/2018 PCP: Remus LofflerJones, Angel S, PA-C   Brief Summary:  DM1 with right foot wound, sent from [facility] due to severe DKA, n/v, septic /hypovolemic shock. sugars more than 1200, hypotensive with venous pH of 7.01 on presentation. She was fluid resuscitated with 7 L of fluid but remained hypotensive requiring Levophed which was started via left EJ.  Unfortunately this infiltrated. [facility] a central line was placed. She was lethargic initially  HPI/Recap of past 24 hours:   Has diarrhea x3 last night Hypoglycemic event now on d5 infusion Has Hypoxia at night, she is currently on room air at rest  No cough today, she is less hoarse She tolerated clears, no n/v, no ab pain, she wants to eat more  She wants the central line removed   Assessment/Plan: Active Problems:   DKA (diabetic ketoacidoses) (HCC)  Severe DKA in a type I diabetic (presenting symptom) -improving, now off insulin drip -a1c 11.2, titrate insulin  Hypoglycemia: -she received d5 -decrease insulin, advance diet  Hypokalemia: Replace k, check mag  septic /hypovolemic shock with significant leukocytosis (presenting symptom) -fluid resuscitated with 7 L of fluid but remained hypotensive requiring Levophed on presentation -source likely right foot wound -mrsa screening negative, blood culture no growth, ua with rare bacteria,  -cxr with edema and possible left lower lobe infiltrate ( she has n/v) -she is improving, d/c vanc and zosyn , change to cipro and flagyl   Acute Metabolic encephalopathy: UDS negative resolving  AKI:  Bu/cr 31/1.9 on presentation Iproving, bun/cr 10/0.8 today  Right foot wound -foot x ray : -1. Soft tissue ulceration along the plantar aspect of the midfoot with diffuse soft tissue swelling of the included ankle and foot. 2. 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 3. Osteopenia without acute radiographic evidence of osteomyelitis. -visual inspection of right foot wound, with mild erythema, no odor  Pulmonary edema  S/p 7 liter fluids resuscitation  She is on lasix at home Echo unremarkable  Anemia  hgb baseline appear to be at 11 hgb 10.5-9.9-9.2 She does not have blood loss, possible dilutional effect from massive fluids resuscitation Continue moitor  Diarrhea: c diff negative Supportive measures  Code Status: full  Family Communication: patient   Disposition Plan: home in 1-2 days   Consultants:  Critical care  Procedures:  left IJ placement on 8/8, remove on 8/10  Antibiotics:  As above   Objective: BP (!) 135/93 (BP Location: Right Arm)   Pulse 81   Temp 98.1 F (36.7 C) (Oral)   Resp 16   Ht 5\' 4"  (1.626 m)   Wt 65.6 kg   LMP 04/01/2018   SpO2 96%   BMI 24.82 kg/m   Intake/Output Summary (Last 24 hours) at 04/27/2018 0735 Last data filed at 04/27/2018 [id number] Gross per 24 hour  Intake 2503.03 ml  Output 600 ml  Net 1903.03 ml   Filed Weights   04/25/18 0730  Weight: 65.6 kg    Exam: Patient is examined daily including today on 04/27/2018, exams remain the same as of yesterday except that has changed    General:  Awake, NAD  Cardiovascular: RRR  Respiratory: improved aeration, no wheezing, no rales  Abdomen: Soft/ND/NT, positive BS  Musculoskeletal: right foot edema, right foot wound on platar surface, mild erythema, no odor, no active drainage at time  on inspection  Neuro: aaox3  Data Reviewed: Basic Metabolic Panel: Recent Labs  Lab 04/25/18 0847 04/25/18 1512 04/25/18 2112 04/26/18 0409 04/27/18 0508  NA 143 143 141 144 138  K 3.4* 4.2 3.9 3.5 2.9*  CL 115* 118* 114* 115* 107  CO2 11* 19* 20* 21* 23  GLUCOSE 438* 251* 87 61* 111*  BUN 18 12 11 10  <5*  CREATININE 1.69* 1.11* 0.98 0.88 0.72  CALCIUM 7.0* 6.9* 7.2*  7.6* 7.8*  MG 1.8  --   --   --   --   PHOS 2.7  --   --   --   --    Liver Function Tests: Recent Labs  Lab 04/25/18 0143 04/27/18 0508  AST 21 43*  ALT 39 34  ALKPHOS 158* 112  BILITOT 2.7* 0.3  PROT 6.4* 5.4*  ALBUMIN 3.2* 2.5*   No results for input(s): LIPASE, AMYLASE in the last 168 hours. No results for input(s): AMMONIA in the last 168 hours. CBC: Recent Labs  Lab 04/25/18 0141 04/25/18 0143 04/25/18 0847 04/27/18 0508  WBC  --  17.9* 26.3* 11.7*  NEUTROABS  --  15.4  --  9.0*  HGB 10.5* 9.5* 9.2* 9.7*  HCT 31.0* 34.5* 28.4* 29.0*  MCV  --  105.2* 91.6 89.2  PLT  --  423* 441* 278   Cardiac Enzymes:   Recent Labs  Lab 04/25/18 0143  TROPONINI <0.03   BNP (last 3 results) No results for input(s): BNP in the last 8760 hours.  ProBNP (last 3 results) No results for input(s): PROBNP in the last 8760 hours.  CBG: Recent Labs  Lab 04/27/18 0004 04/27/18 0105 04/27/18 0410 04/27/18 0426 04/27/18 0447  GLUCAP 69* 74 41* 57* 131*    Recent Results (from the past 240 hour(s))  MRSA PCR Screening     Status: None   Collection Time: 04/25/18  7:21 AM  Result Value Ref Range Status   MRSA by PCR NEGATIVE NEGATIVE Final    Comment:        The GeneXpert MRSA Assay (FDA approved for NASAL specimens only), is one component of a comprehensive MRSA colonization surveillance program. It is not intended to diagnose MRSA infection nor to guide or monitor treatment for MRSA infections. Performed at [facility], [address] [place], [place] [address]   Culture, blood (Routine X 2) w Reflex to ID Panel     Status: None (Preliminary result)   Collection Time: 04/25/18  8:53 AM  Result Value Ref Range Status   Specimen Description BLOOD LEFT ANTECUBITAL  Final   Special Requests   Final    BOTTLES DRAWN AEROBIC ONLY Blood Culture adequate volume   Culture   Final    NO GROWTH 1 DAY Performed at [facility], [address]  [place], [place] [address]    Report Status PENDING  Incomplete  Culture, blood (routine x 2)     Status: None (Preliminary result)   Collection Time: 04/25/18  9:11 AM  Result Value Ref Range Status   Specimen Description BLOOD CENTRAL LINE  Final   Special Requests   Final    BOTTLES DRAWN AEROBIC ONLY Blood Culture adequate volume   Culture   Final    NO GROWTH 1 DAY Performed at [facility], [address], [place], [place] [address]    Report Status PENDING  Incomplete     Studies: No results found.  Scheduled Meds: .  enoxaparin (LOVENOX) injection  40 mg Subcutaneous Q24H  . insulin aspart  3-9 Units Subcutaneous Q4H  . insulin detemir  20 Units Subcutaneous Q12H  . mupirocin cream   Topical Daily    Continuous Infusions: . sodium chloride Stopped (04/26/18 1631)  . dextrose 5 % and 0.45% NaCl 100 mL/hr at 04/27/18 0405  . norepinephrine (LEVOPHED) Adult infusion Stopped (04/26/18 0110)  . piperacillin-tazobactam (ZOSYN)  IV 3.375 g (04/27/18 0011)  . sodium chloride    . sodium chloride    . vancomycin 750 mg (04/26/18 2013)     Time spent: I have personally reviewed and interpreted on  04/27/2018 daily labs, tele strips, imagings as discussed above under date review session and assessment and plans.  I reviewed all nursing notes, pharmacy notes, consultant notes,  vitals, pertinent old records  I have discussed plan of care as described above with RN , patient  on 04/27/2018   Albertine Grates MD, PhD  [facility] Pager [phone]. If 7PM-7AM, please contact night-coverage at [email or web address], password [facility] 04/27/2018, 7:35 AM  LOS: 2 days

## 2018-04-27 NOTE — Progress Notes (Addendum)
Pt lactic acid elevated at 2.9, had diarrhea x 3, last one at 0630, no fever, no c/o abdominal pain. Patient remains alert and oriented x 4. Two hypoglycemic events last night. Roda ShuttersXu, MD aware. Will assess pt this am.

## 2018-04-27 NOTE — Progress Notes (Signed)
Hypoglycemic Event  CBG: 57  Treatment: D50 IV 25 mL  Symptoms: sleepy  Follow-up CBG: Time:0447 CBG Result:131  Possible Reasons for Event: Inadequate meal intake  Comments/MD notified:yes    Bentleigh Waren N Kristof Nadeem

## 2018-04-27 NOTE — Progress Notes (Signed)
Hypoglycemic Event  CBG: 69  Treatment: 15 GM carbohydrate snack  Symptoms: None  Follow-up CBG: Time:0105 CBG Result:74  Possible Reasons for Event: Inadequate meal intake  Comments/MD notified:yes    Leather Estis N Cathy Crounse

## 2018-04-27 NOTE — Progress Notes (Signed)
Hypoglycemic Event  CBG: 41   Treatment: 15 GM carbohydrate snack  Symptoms: None  Follow-up CBG: Time:0425 CBG Result:57   Possible Reasons for Event: Inadequate meal intake  Comments/MD notified:yes    Daryn Pisani N Emrey Thornley

## 2018-04-27 NOTE — Progress Notes (Signed)
  Echocardiogram 2D Echocardiogram has been performed.  [name] ChimesWendy  [name] 04/27/2018, 9:33 AM

## 2018-04-28 DIAGNOSIS — J690 Pneumonitis due to inhalation of food and vomit: Secondary | ICD-10-CM

## 2018-04-28 DIAGNOSIS — E876 Hypokalemia: Secondary | ICD-10-CM

## 2018-04-28 DIAGNOSIS — N179 Acute kidney failure, unspecified: Secondary | ICD-10-CM

## 2018-04-28 LAB — CBC
HCT: 32.7 % — ABNORMAL LOW (ref 36.0–46.0)
HEMOGLOBIN: 10.5 g/dL — AB (ref 12.0–15.0)
MCH: 29.2 pg (ref 26.0–34.0)
MCHC: 32.1 g/dL (ref 30.0–36.0)
MCV: 90.8 fL (ref 78.0–100.0)
Platelets: 314 10*3/uL (ref 150–400)
RBC: 3.6 MIL/uL — AB (ref 3.87–5.11)
RDW: 15.4 % (ref 11.5–15.5)
WBC: 5.5 10*3/uL (ref 4.0–10.5)

## 2018-04-28 LAB — GASTROINTESTINAL PANEL BY PCR, STOOL (REPLACES STOOL CULTURE)
ADENOVIRUS F40/41: NOT DETECTED
Astrovirus: NOT DETECTED
CAMPYLOBACTER SPECIES: NOT DETECTED
CRYPTOSPORIDIUM: NOT DETECTED
Cyclospora cayetanensis: NOT DETECTED
ENTEROPATHOGENIC E COLI (EPEC): NOT DETECTED
Entamoeba histolytica: NOT DETECTED
Enteroaggregative E coli (EAEC): NOT DETECTED
Enterotoxigenic E coli (ETEC): NOT DETECTED
Giardia lamblia: NOT DETECTED
NOROVIRUS GI/GII: NOT DETECTED
PLESIMONAS SHIGELLOIDES: NOT DETECTED
ROTAVIRUS A: NOT DETECTED
SHIGELLA/ENTEROINVASIVE E COLI (EIEC): NOT DETECTED
Salmonella species: NOT DETECTED
Sapovirus (I, II, IV, and V): NOT DETECTED
Shiga like toxin producing E coli (STEC): NOT DETECTED
Vibrio cholerae: NOT DETECTED
Vibrio species: NOT DETECTED
YERSINIA ENTEROCOLITICA: NOT DETECTED

## 2018-04-28 LAB — COMPREHENSIVE METABOLIC PANEL
ALBUMIN: 2.7 g/dL — AB (ref 3.5–5.0)
ALK PHOS: 146 U/L — AB (ref 38–126)
ALT: 43 U/L (ref 0–44)
AST: 53 U/L — ABNORMAL HIGH (ref 15–41)
Anion gap: 13 (ref 5–15)
CALCIUM: 8.2 mg/dL — AB (ref 8.9–10.3)
CHLORIDE: 105 mmol/L (ref 98–111)
CO2: 20 mmol/L — AB (ref 22–32)
Creatinine, Ser: 0.66 mg/dL (ref 0.44–1.00)
GFR calc non Af Amer: >60 mL/min (ref >60)
Glucose, Bld: 189 mg/dL — ABNORMAL HIGH (ref 70–99)
Potassium: 3.4 mmol/L — ABNORMAL LOW (ref 3.5–5.1)
SODIUM: 138 mmol/L (ref 135–145)
Total Bilirubin: 0.7 mg/dL (ref 0.3–1.2)
Total Protein: 5.9 g/dL — ABNORMAL LOW (ref 6.5–8.1)

## 2018-04-28 LAB — GLUCOSE, CAPILLARY
GLUCOSE-CAPILLARY: 179 mg/dL — AB (ref 70–99)
GLUCOSE-CAPILLARY: 113 mg/dL — AB (ref 70–99)
GLUCOSE-CAPILLARY: 202 mg/dL — AB (ref 70–99)
GLUCOSE-CAPILLARY: 105 mg/dL — AB (ref 70–99)
Glucose-Capillary: 326 mg/dL — ABNORMAL HIGH (ref 70–99)
Glucose-Capillary: 84 mg/dL (ref 70–99)

## 2018-04-28 LAB — LACTIC ACID, PLASMA
Lactic Acid, Venous: 3.3 mmol/L (ref 0.5–1.9)
Lactic Acid, Venous: 1.6 mmol/L (ref 0.5–1.9)

## 2018-04-28 LAB — MAGNESIUM: Magnesium: 2 mg/dL (ref 1.7–2.4)

## 2018-04-28 MED ORDER — GI COCKTAIL ~~LOC~~
30.0000 mL | Freq: Once | ORAL | Status: AC
  Administered 2018-04-28: 30 mL via ORAL
  Filled 2018-04-28: qty 30

## 2018-04-28 MED ORDER — PANTOPRAZOLE SODIUM 20 MG PO TBEC
20.0000 mg | DELAYED_RELEASE_TABLET | Freq: Every day | ORAL | Status: DC
  Administered 2018-04-29: 20 mg via ORAL
  Filled 2018-04-28: qty 1

## 2018-04-28 MED ORDER — INSULIN ASPART 100 UNIT/ML ~~LOC~~ SOLN
5.0000 [IU] | Freq: Once | SUBCUTANEOUS | Status: AC
  Administered 2018-04-28: 5 [IU] via SUBCUTANEOUS

## 2018-04-28 MED ORDER — INSULIN ASPART 100 UNIT/ML ~~LOC~~ SOLN
3.0000 [IU] | Freq: Once | SUBCUTANEOUS | Status: AC
  Administered 2018-04-28: 3 [IU] via SUBCUTANEOUS

## 2018-04-28 MED ORDER — SODIUM CHLORIDE 0.9 % IV BOLUS
1000.0000 mL | Freq: Once | INTRAVENOUS | Status: AC
  Administered 2018-04-28: 1000 mL via INTRAVENOUS

## 2018-04-28 MED ORDER — POTASSIUM CHLORIDE CRYS ER 20 MEQ PO TBCR
40.0000 meq | EXTENDED_RELEASE_TABLET | Freq: Once | ORAL | Status: AC
  Administered 2018-04-28: 40 meq via ORAL
  Filled 2018-04-28: qty 2

## 2018-04-28 NOTE — Progress Notes (Signed)
Notified Schorr, NP that pt requesting medication for heartburn. Will continue to monitor pt. Nelda MarseilleJenny Thacker, RN

## 2018-04-28 NOTE — Progress Notes (Signed)
CRITICAL VALUE ALERT  Critical Value:  Lactic acid 3.3  Date & Time Notied:  04/28/18 at 0609  Provider Notified: Schorr, NP  Orders Received/Actions taken: awaiting new orders

## 2018-04-28 NOTE — Progress Notes (Signed)
Pt CBG 326, insulin novolog 6 units due at 2000 on 8/11 held due to pt being hypoglycemic in early am on 04/27/18.  After reviewing pt chart, noticed that Levemir 5 units scheduled for 1800 on 8/10 was held due to pt CBG of 75.  Schorr, NP consulted and RN instructed to give missed dose of Levemir and administer 5 units of Novolog.  Pt alert, oriented and asymptomatic.  Will continue to monitor.

## 2018-04-28 NOTE — Evaluation (Signed)
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 Pt is limited in her mobility by NWB status in her R LE. Pt was scheduled for appointment with Podiatrist on 04/25/18 with hopes of NWB restrictions being removed, however was in hospital at that time. Pt is currently mod I for bed mobility and transfers and min guard for ambulation with RW. PT recommends outpatient PT once weightbearing restrictions are removed to work on gait and endurance training as well as strengthening her R LE. PT will continue to follow acutely to progress endurance and strength with RW.     Follow Up Recommendations Other (comment)(Outpatient PT once weightbearing restrictions are removed)    Equipment Recommendations  None recommended by PT       Precautions / Restrictions Precautions Precautions: None Restrictions Weight Bearing Restrictions: Yes RLE Weight Bearing: Non weight bearing Other Position/Activity Restrictions: pt reports being able to use heel only, however last podiatry note states NWB, pt was scheduled for podiatry appointment on 8/8 and was expecting to have weightbearing restrictions lifted.       Mobility  Bed Mobility Overal bed mobility:  Independent                Transfers Overall transfer level: Modified independent Equipment used: Rolling walker (2 wheeled)             General transfer comment: good power up and steadying in standing  Ambulation/Gait Ambulation/Gait assistance: Min guard Gait Distance (Feet): 20 Feet Assistive device: Rolling walker (2 wheeled) Gait Pattern/deviations: Step-to pattern;Antalgic;Decreased step length - left Gait velocity: slowed Gait velocity interpretation: <1.8 ft/sec, indicate of risk for recurrent falls General Gait Details: able to utilize RW and hop to gait pattern for mobility while maintaining R LE  NWB  Stairs Stairs: (pt reports bumping up stairs on her bottom into her home )              Balance Overall balance assessment: Mild deficits observed, not formally tested                                           Pertinent Vitals/Pain Pain Assessment: Faces Faces Pain Scale: Hurts a little bit Pain Location: R foot with dependent position  Pain Descriptors / Indicators: Grimacing;Guarding Pain Intervention(s): Limited activity within patient's tolerance;Monitored during session    Home Living Family/patient expects to be discharged to:: Private residence Living Arrangements: Spouse/significant other;Children Available Help at Discharge: Family;Available 24 hours/day Type of Home: House Home Access: Stairs to enter Entrance Stairs-Rails: None Entrance Stairs-Number of Steps: 3 Home Layout: One level Home Equipment: Walker - 2  wheels(knee scooter)      Prior Function Level of Independence: Needs assistance   Gait / Transfers Assistance Needed: use knee scooter to maintain NWB, however padding worn out and causing knee pain, has been bearing weight through her heel to get around  ADL's / Homemaking Assistance Needed: unable to stand in shower, sits on edge of tub to wash, able to complete other ADLS            Extremity/Trunk Assessment   Upper Extremity Assessment Upper Extremity Assessment: Overall WFL for tasks assessed    Lower Extremity Assessment Lower Extremity Assessment: RLE deficits/detail RLE Deficits / Details: s/p surgical fixation of Charcot foot in 11/2017, difficulty in healing, hip and knee ROM WFL, strength grossly 3/5 RLE Sensation: decreased light touch       Communication   Communication: No difficulties  Cognition Arousal/Alertness: Awake/alert Behavior During Therapy: WFL for tasks assessed/performed Overall Cognitive Status: Within Functional Limits for tasks assessed                                        General Comments General comments (skin integrity, edema, etc.): SaO2 on RA >96%O2 throughout session      Assessment/Plan    PT Assessment Patient needs continued PT services  PT Problem List Decreased strength;Decreased range of motion;Decreased mobility       PT Treatment Interventions Gait training;Stair training;DME instruction;Functional mobility training;Therapeutic activities;Therapeutic exercise;Balance training;Cognitive remediation;Patient/family education    PT Goals (Current goals can be found in the Care Plan section)  Acute Rehab PT Goals Patient Stated Goal: go home PT Goal Formulation: With patient Time For Goal Achievement: 05/12/18 Potential to Achieve Goals: Good    Frequency Min 3X/week    AM-PAC PT "6 Clicks" Daily Activity  Outcome Measure Difficulty turning over in bed (including adjusting bedclothes, sheets and blankets)?: None Difficulty moving from lying on back to sitting on the side of the bed? : None Difficulty sitting down on and standing up from a chair with arms (e.g., wheelchair, bedside commode, etc,.)?: None Help needed moving to and from a bed to chair (including a wheelchair)?: None Help needed walking in hospital room?: A Little Help needed climbing 3-5 steps with a railing? : A Little 6  Click Score: 22    End of Session Equipment Utilized During Treatment: Gait belt Activity Tolerance: Patient tolerated treatment well Patient left: in bed Nurse Communication: Mobility status PT Visit Diagnosis: Muscle weakness (generalized) (M62.81);Difficulty in walking, not elsewhere classified (R26.2)    Time: [id number] PT Time Calculation (min) (ACUTE ONLY): 18 min   Charges:   PT Evaluation $PT Eval Moderate Complexity: 1 Mod          Paiten Boies B. Beverely RisenVan Fleet PT, DPT Acute Rehabilitation  [phone] Pager [phone]    Elon Alaslizabeth B Van Fleet 04/28/2018, 5:50 PM

## 2018-04-28 NOTE — Progress Notes (Signed)
PROGRESS NOTE  [name] OZH:[id number] DOB: 12/22/1980 DOA: 04/25/2018 PCP: Remus Loffler, PA-C   Brief Summary:  DM1 with right foot wound, sent from [facility] due to severe DKA, n/v, septic /hypovolemic shock. sugars more than 1200, hypotensive with venous pH of 7.01 on presentation. She was fluid resuscitated with 7 L of fluid but remained hypotensive requiring Levophed which was started via left EJ.  Unfortunately this infiltrated. After arrival to [facility] ICU a central line was placed. She was lethargic initially  HPI/Recap of past 24 hours:  Am blood glucose  At 5 am is 189, at 8am is 84  She reports no diarrhea since this am  Tolerating full liquid, no n/v, no ab pain, she wants to eat more     Assessment/Plan: Active Problems:   DKA (diabetic ketoacidoses) (HCC)  Severe DKA in a type I diabetic (presenting symptom) -improving, now off insulin drip -a1c 11.2, titrate insulin -she reports use lantus 15units qhs at home for the last few months, she was on a higher dose before but reduced to 15units due to night time hypoglycemia, some time drops the 20's at home.  Hypoglycemia: -she received d5 infusion on 8/9-8/10 -blood glucose appear improving on reduced dose lantus 5units bid, continue adjust  insulin, advance diet to soft diet  Hypokalemia: Remain low, continue to Replace k,  Mag 2.  septic /hypovolemic shock with significant leukocytosis (presenting symptom) -fluid resuscitated with 7 L of fluid at outside hospital ED but remained hypotensive requiring Levophed on presentation -source likely right foot wound -mrsa screening negative, blood culture no growth, ua with rare bacteria,  -cxr with edema and possible left lower lobe infiltrate ( she has n/v) -she is improving, d/c vanc and zosyn , change to cipro and flagyl   Acute Metabolic encephalopathy: UDS negative Resolved, fully alert today  AKI:  Bu/cr 31/1.9 on presentation Iproving,  bun/cr 5/0.6 today  Right foot wound -foot x ray : -1. Soft tissue ulceration along the plantar aspect of the midfoot with diffuse soft tissue swelling of the included ankle and foot. 2. 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 3. Osteopenia without acute radiographic evidence of osteomyelitis. -visual inspection of right foot wound, with mild erythema, no odor -she is to follow podiatrist   Pulmonary edema  S/p 7 liter fluids resuscitation  Home meds lasix held since admission, Echo unremarkable  Anemia  hgb baseline appear to be at 11 hgb 10.5-9.9-9.2-10.5 She does not have blood loss, possible dilutional effect from massive fluids resuscitation vs bone marrow suppression in the setting of sepsis Continue moitor  Diarrhea: c diff negative Supportive measures  Mild elevation of lft: Will get ab [place]  Tele unremarkable, d/c tele  Code Status: full  Family Communication: patient and husband at bedside  Disposition Plan: home , likely on 8/12   Consultants:  Critical care  Procedures:  left IJ placement on 8/8, remove on 8/10  Antibiotics:  As above   Objective: BP (!) 145/97 (BP Location: Right Arm)   Pulse 84   Temp 98.7 F (37.1 C) (Oral)   Resp 20   Ht 5\' 4"  (1.626 m)   Wt 65.6 kg   LMP 04/01/2018   SpO2 98%   BMI 24.82 kg/m   Intake/Output Summary (Last 24 hours) at 04/28/2018 1522 Last data filed at 04/28/2018 1100 Gross per 24 hour  Intake 1480 ml  Output -  Net 1480 ml   Filed Weights   04/25/18 0730  Weight: 65.6 kg    Exam: Patient is examined daily including today on 04/28/2018, exams remain the same as of yesterday except that has changed    General:  Awake, NAD  Cardiovascular: RRR  Respiratory: improved aeration, no wheezing, no rales  Abdomen: Soft/ND/NT, positive BS  Musculoskeletal: right foot edema, right foot wound on platar surface,  mild erythema, no odor, no active drainage at time on inspection  Neuro: aaox3  Data Reviewed: Basic Metabolic Panel: Recent Labs  Lab 04/25/18 0847 04/25/18 1512 04/25/18 2112 04/26/18 0409 04/27/18 0508 04/28/18 0500  NA 143 143 141 144 138 138  K 3.4* 4.2 3.9 3.5 2.9* 3.4*  CL 115* 118* 114* 115* 107 105  CO2 11* 19* 20* 21* 23 20*  GLUCOSE 438* 251* 87 61* 111* 189*  BUN 18 12 11 10  <5* <5*  CREATININE 1.69* 1.11* 0.98 0.88 0.72 0.66  CALCIUM 7.0* 6.9* 7.2* 7.6* 7.8* 8.2*  MG 1.8  --   --   --   --  2.0  PHOS 2.7  --   --   --   --   --    Liver Function Tests: Recent Labs  Lab 04/25/18 0143 04/27/18 0508 04/28/18 0500  AST 21 43* 53*  ALT 39 34 43  ALKPHOS 158* 112 146*  BILITOT 2.7* 0.3 0.7  PROT 6.4* 5.4* 5.9*  ALBUMIN 3.2* 2.5* 2.7*   No results for input(s): LIPASE, AMYLASE in the last 168 hours. No results for input(s): AMMONIA in the last 168 hours. CBC: Recent Labs  Lab 04/25/18 0141 04/25/18 0143 04/25/18 0847 04/27/18 0508 04/28/18 0500  WBC  --  17.9* 26.3* 11.7* 5.5  NEUTROABS  --  15.4  --  9.0*  --   HGB 10.5* 9.5* 9.2* 9.7* 10.5*  HCT 31.0* 34.5* 28.4* 29.0* 32.7*  MCV  --  105.2* 91.6 89.2 90.8  PLT  --  423* 441* 278 314   Cardiac Enzymes:   Recent Labs  Lab 04/25/18 0143  TROPONINI <0.03   BNP (last 3 results) No results for input(s): BNP in the last 8760 hours.  ProBNP (last 3 results) No results for input(s): PROBNP in the last 8760 hours.  CBG: Recent Labs  Lab 04/27/18 2032 04/28/18 0013 04/28/18 0400 04/28/18 0809 04/28/18 1158  GLUCAP 179* 326* 179* 84 113*    Recent Results (from the past 240 hour(s))  MRSA PCR Screening     Status: None   Collection Time: 04/25/18  7:21 AM  Result Value Ref Range Status   MRSA by PCR NEGATIVE NEGATIVE Final    Comment:        The GeneXpert MRSA Assay (FDA approved for NASAL specimens only), is one component of a comprehensive MRSA colonization surveillance program.  It is not intended to diagnose MRSA infection nor to guide or monitor treatment for MRSA infections. Performed at [facility], [address] [place], [place] [address]   Culture, blood (Routine X 2) w Reflex to ID Panel     Status: None (Preliminary result)   Collection Time: 04/25/18  8:53 AM  Result Value Ref Range Status   Specimen Description BLOOD LEFT ANTECUBITAL  Final   Special Requests   Final    BOTTLES DRAWN AEROBIC ONLY Blood Culture adequate volume   Culture   Final    NO GROWTH 3 DAYS Performed at [facility], [address], [place], [place] [address]    Report Status PENDING  Incomplete  Culture, blood (routine x 2)     Status: None (Preliminary result)   Collection Time: 04/25/18  9:11 AM  Result Value Ref Range Status   Specimen Description BLOOD CENTRAL LINE  Final   Special Requests   Final    BOTTLES DRAWN AEROBIC ONLY Blood Culture adequate volume   Culture   Final    NO GROWTH 3 DAYS Performed at [facility], [address], [place], [place] [address]    Report Status PENDING  Incomplete  C difficile quick scan w PCR reflex     Status: None   Collection Time: 04/27/18 12:27 PM  Result Value Ref Range Status   C Diff antigen NEGATIVE NEGATIVE Final   C Diff toxin NEGATIVE NEGATIVE Final   C Diff interpretation No C. difficile detected.  Final    Comment: Performed at [facility], [address], [place], [place] [address]     Studies: No results found.  Scheduled Meds: . ciprofloxacin  500 mg Oral BID  . enoxaparin (LOVENOX) injection  40 mg Subcutaneous Q24H  . insulin aspart  3-9 Units Subcutaneous Q4H  . insulin detemir  5 Units Subcutaneous Q12H  . metroNIDAZOLE  500 mg Oral Q8H  . mupirocin cream   Topical Daily  . saccharomyces boulardii  250 mg Oral BID    Continuous Infusions:    Time spent: I have personally reviewed and interpreted on  04/28/2018 daily labs, tele strips, imagings as discussed  above under date review session and assessment and plans.  I reviewed all nursing notes, pharmacy notes, consultant notes,  vitals, pertinent old records  I have discussed plan of care as described above with RN , patient  And family on 04/28/2018   Albertine Grates MD, PhD  [facility] Pager [phone]. If 7PM-7AM, please contact night-coverage at [email or web address], password [facility] 04/28/2018, 3:22 PM  LOS: 3 days

## 2018-04-29 ENCOUNTER — Inpatient Hospital Stay (HOSPITAL_COMMUNITY): Payer: Self-pay

## 2018-04-29 LAB — COMPREHENSIVE METABOLIC PANEL
ALBUMIN: 2.6 g/dL — AB (ref 3.5–5.0)
ALK PHOS: 139 U/L — AB (ref 38–126)
ALT: 35 U/L (ref 0–44)
ANION GAP: 10 (ref 5–15)
AST: 32 U/L (ref 15–41)
BILIRUBIN TOTAL: 0.5 mg/dL (ref 0.3–1.2)
BUN: <5 mg/dL — ABNORMAL LOW (ref 6–20)
CALCIUM: 8.2 mg/dL — AB (ref 8.9–10.3)
CO2: 29 mmol/L (ref 22–32)
Chloride: 100 mmol/L (ref 98–111)
Creatinine, Ser: 0.6 mg/dL (ref 0.44–1.00)
GFR calc non Af Amer: >60 mL/min (ref >60)
Glucose, Bld: 179 mg/dL — ABNORMAL HIGH (ref 70–99)
POTASSIUM: 2.8 mmol/L — AB (ref 3.5–5.1)
Sodium: 139 mmol/L (ref 135–145)
TOTAL PROTEIN: 5.8 g/dL — AB (ref 6.5–8.1)

## 2018-04-29 LAB — LIPID PANEL
CHOLESTEROL: 133 mg/dL (ref 0–200)
HDL: 34 mg/dL — ABNORMAL LOW (ref >40)
LDL Cholesterol: 73 mg/dL (ref 0–99)
TRIGLYCERIDES: 131 mg/dL (ref <150)
Total CHOL/HDL Ratio: 3.9 RATIO
VLDL: 26 mg/dL (ref 0–40)

## 2018-04-29 LAB — CBC
HEMATOCRIT: 32.1 % — AB (ref 36.0–46.0)
Hemoglobin: 10.4 g/dL — ABNORMAL LOW (ref 12.0–15.0)
MCH: 29 pg (ref 26.0–34.0)
MCHC: 32.4 g/dL (ref 30.0–36.0)
MCV: 89.4 fL (ref 78.0–100.0)
Platelets: 273 10*3/uL (ref 150–400)
RBC: 3.59 MIL/uL — ABNORMAL LOW (ref 3.87–5.11)
RDW: 14.8 % (ref 11.5–15.5)
WBC: 5.3 10*3/uL (ref 4.0–10.5)

## 2018-04-29 LAB — GLUCOSE, CAPILLARY
GLUCOSE-CAPILLARY: 134 mg/dL — AB (ref 70–99)
GLUCOSE-CAPILLARY: 157 mg/dL — AB (ref 70–99)
GLUCOSE-CAPILLARY: 175 mg/dL — AB (ref 70–99)
Glucose-Capillary: 41 mg/dL — CL (ref 70–99)
Glucose-Capillary: 219 mg/dL — ABNORMAL HIGH (ref 70–99)

## 2018-04-29 LAB — MAGNESIUM: MAGNESIUM: 1.7 mg/dL (ref 1.7–2.4)

## 2018-04-29 LAB — SEDIMENTATION RATE: SED RATE: 54 mm/h — AB (ref 0–22)

## 2018-04-29 LAB — C-REACTIVE PROTEIN: CRP: 1.6 mg/dL — AB (ref <1.0)

## 2018-04-29 LAB — LIPASE, BLOOD: Lipase: 26 U/L (ref 11–51)

## 2018-04-29 LAB — LACTIC ACID, PLASMA: Lactic Acid, Venous: 1.8 mmol/L (ref 0.5–1.9)

## 2018-04-29 MED ORDER — POTASSIUM CHLORIDE CRYS ER 20 MEQ PO TBCR
40.0000 meq | EXTENDED_RELEASE_TABLET | Freq: Once | ORAL | Status: AC
  Administered 2018-04-29: 40 meq via ORAL
  Filled 2018-04-29: qty 2

## 2018-04-29 MED ORDER — FUROSEMIDE 20 MG PO TABS: 20.0000 mg | ORAL_TABLET | ORAL | 0 refills | Status: DC | PRN

## 2018-04-29 MED ORDER — DEXTROSE 50 % IV SOLN
INTRAVENOUS | Status: AC
  Administered 2018-04-29: 50 mL
  Filled 2018-04-29: qty 50

## 2018-04-29 MED ORDER — LISINOPRIL 2.5 MG PO TABS: 2.5000 mg | ORAL_TABLET | Freq: Every day | ORAL | 0 refills | Status: DC

## 2018-04-29 MED ORDER — CIPROFLOXACIN HCL 500 MG PO TABS: 500.0000 mg | ORAL_TABLET | Freq: Two times a day (BID) | ORAL | 0 refills | Status: AC

## 2018-04-29 MED ORDER — PANTOPRAZOLE SODIUM 20 MG PO TBEC: 20.0000 mg | DELAYED_RELEASE_TABLET | Freq: Every day | ORAL | 0 refills | Status: DC

## 2018-04-29 MED ORDER — INSULIN GLARGINE 100 UNIT/ML ~~LOC~~ SOLN: 5.0000 [IU] | Freq: Two times a day (BID) | SUBCUTANEOUS | 0 refills | Status: DC

## 2018-04-29 MED ORDER — METRONIDAZOLE 500 MG PO TABS: 500.0000 mg | ORAL_TABLET | Freq: Three times a day (TID) | ORAL | 0 refills | Status: AC

## 2018-04-29 MED ORDER — SACCHAROMYCES BOULARDII 250 MG PO CAPS: 250.0000 mg | ORAL_CAPSULE | Freq: Two times a day (BID) | ORAL | 0 refills | Status: AC

## 2018-04-29 NOTE — Progress Notes (Signed)
Pt given discharge instructions, prescriptions, and care notes. Pt verbalized understanding AEB no further questions or concerns at this time. IV was discontinued, no redness, pain, or swelling noted at this time. Telemetry discontinued and Centralized Telemetry was notified. Pt left the floor via wheelchair with staff in stable condition. 

## 2018-04-29 NOTE — Progress Notes (Signed)
Inpatient Diabetes Program   AACE/ADA: New Consensus Statement on Inpatient Glycemic Control (2015)  Target Ranges:  Prepandial:   less than 140 mg/dL      Peak postprandial:   less than 180 mg/dL (1-2 hours)      Critically ill patients:  140 - 180 mg/dL   Spoke with patient again at bedside. Patient reports again having insurance but not medication coverage. Patient has papers at home that all she has to do is mail for med assist in order to get her insulins (lilly brand). Patient reports learning how to carb count but not "being good with numbers." Patient elaborated saying she has a learning disability when it came to numbers and could not remember them.  Patient had Freestyle Libre ordered for her, however she could not afford the $70  Copay. Mentioned to patient sometimes it has to be filed as a medical device instead of a medication prescription for coverage.   Informed patient of Current A1c this admission 11.2% on 8/10. Patient reports keeping a regular 20 oz soda with her and sipping it all day so she will not have hypoglycemia. Patient reports within the past 2 years she has become more brittle.  Called patient's Endocrinology office, [facility] [place]. Spoke with [name]. [name] is out of the office today and will be back tomorrow. The described patient to be one Lantus 15 units and Humalog SSI, one for eating and one for not eating. Informed office of patient's learning barrier. Discussed current inpatient regimen to the office. And faxed glucose trends to the office. The office requesting for patient to follow up and bring glucose readings with her. They mentioned her mom usually comes with her.  Will reprint directions from last visit with her FNP with Endocrinology for directions on her correction scale and attach to AVS and will also give her a copy.  Thanks,  [name] DeemShannon Clayten Allcock RN, MSN, BC-ADM Inpatient Diabetes Coordinator Team  Pager [phone] (8a-5p)

## 2018-04-29 NOTE — Discharge Summary (Signed)
Discharge Summary  [name] ZOX:[id number] DOB: 05/19/1981  PCP: Remus Loffler, PA-C  Admit date: 04/25/2018 Discharge date: 04/29/2018  Time spent: , more than 50% time spent on coordination of  Care.  Recommendations for Outpatient Follow-up:  1. F/u with PMD within a week  for hospital discharge follow up, repeat cbc/bmp at follow up 2. F/u with diabetes management [facility] [place]  Discharge Diagnoses:  Active Hospital Problems   Diagnosis Date Noted  . DKA (diabetic ketoacidoses) (HCC) 04/25/2018    Resolved Hospital Problems  No resolved problems to display.    Discharge Condition: stable  Diet recommendation: heart healthy/carb modified  Filed Weights   04/25/18 0730  Weight: 65.6 kg    History of present illness: (per critical care admitting MD Dr Kathy Breach) 37 year old with IDDM admitted by any pain ED where she presented with nausea vomiting and was found to be in DKA, sugars more than 1200, hypotensive with venous pH of 7.01. She was fluid resuscitated with 7 L of fluid but remained hypotensive requiring Levophed which was started via left EJ.  Unfortunately this infiltrated. After arrival to [facility] ICU a central line was placed. On exam-lethargic but easily arousable and follows commands, soft and nontender abdomen, dry mucosa, good pulses, right foot wound full-thickness, dry no discharge with mild edema surrounding, appears chronic.  Patient has been seeing a podiatrist in [place] for this and Enterobacter has been isolated  Hospital Course:  Active Problems:   DKA (diabetic ketoacidoses) (HCC)   Severe DKA in a type I diabetic (presenting symptom) -resolved, now off insulin drip -a1c 11.2, titrate insulin -she reports use lantus 15units qhs at home for the last few months, she was on a higher dose before but reduced to 15units due to night time hypoglycemia, some time drops the 20's at home. -she has hypoglycemia event in the hospital event on  lantus 15units for which she received d5 infusion on 8/9-8/10 -blood glucose appear improving on reduced dose lantus 5units bid, she is discharged on lantus 5units bid, with ssi. humulog SSI -<80= treat hypoglycemia ssi when eating 80-99=3; 100-150=5; 150+= 7u; >201=8; >251=9u; >301=10; >401=11.  ssi scale when Not eating: >150=1; >201=2; >251=3; >301=4; >400=5     She required intensive diabetes education: Per Diabetes RN "Spoke with patient again at bedside. Patient reports again having insurance but not medication coverage. Patient has papers at home that all she has to do is mail for med assist in order to get her insulins (lilly brand). Patient reports learning how to carb count but not "being good with numbers." Patient elaborated saying she has a learning disability when it came to numbers and could not remember them.  Patient had Freestyle Libre ordered for her, however she could not afford the $70  Copay. Mentioned to patient sometimes it has to be filed as a medical device instead of a medication prescription for coverage.   Informed patient of Current A1c this admission 11.2% on 8/10. Patient reports keeping a regular 20 oz soda with her and sipping it all day so she will not have hypoglycemia. Patient reports within the past 2 years she has become more brittle.  Called patient's Endocrinology office, [facility] in [facility]. Spoke with Hilda Lias. Bing Neighbors is out of the office today and will be back tomorrow. The described patient to be one Lantus 15 units and Humalog SSI, one for eating and one for not eating. Informed office of patient's learning barrier. Discussed current inpatient  regimen to the office. And faxed glucose trends to the office. The office requesting for patient to follow up and bring glucose readings with her. They mentioned her mom usually comes with her.  Will reprint directions from last visit with her FNP with  Endocrinology for directions on her correction scale and attach to AVS and will also give her a copy.  Thanks,  Christena DeemShannon Willis RN, MSN, BC-ADM Inpatient Diabetes Coordinator Team Pager [phone] (8a-5p)"   Hypokalemia: Replaced k,  Mag 2. pcp to repeat lab at hospital follow up.  septic /hypovolemic shock with significant leukocytosis (presenting symptom) -fluid resuscitated with 7 L of fluid at outside hospital ED but remained hypotensive requiring Levophed on presentation -source likely right foot wound vs aspiration pneumonia,  -cxr with edema and possible left lower lobe infiltrate ( she has n/v) -mrsa screening negative, blood culture no growth, ua with rare bacteria,  --she received vanc and zosyn initially , she is improving, abx changed to cipro and flagyl, she is discharged on cipro/flagyl to finish treatment course.   Acute Metabolic encephalopathy: She was very lethargic initially UDS negative Resolved, she is aaox3, fully alert and interactive   AKI:  Bu/cr 31/1.9 on presentation resolved, bun/cr 5/0.6 today  Right foot wound -foot x ray : -1. Soft tissue ulceration along the plantar aspect of the midfoot with diffuse soft tissue swelling of the included ankle and foot. 2. 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 3. Osteopenia without acute radiographic evidence of osteomyelitis. -visual inspection of right foot wound, with mild erythema, no odor -she is to follow podiatrist   Pulmonary edema  S/p 7 liter fluids resuscitation  Home meds lasix held since admission, she is discharged on prn lasix for edema, she does not appear volume overloaded at discharge Echo unremarkable  Anemia, possible anemia of chronic disease  hgb baseline appear to be at 11 hgb 10.5-9.9-9.2-10.5-10.4 She does not have blood loss, possible dilutional effect from massive fluids resuscitation vs  bone marrow suppression in the setting of sepsis   Diarrhea: c diff negative Supportive measures Resolved   Mild elevation of lft: Hepatitis panel in process ab us" 1. Benign-appearing gallbladder polyps measuring less than 5 mm. 2. No evidence of cholelithiasis or cholecystitis. 3. Normal appearance of the liver"  AST peaked at 53,  normalized at discharge  HTN: new diagnosis, she was previously not on bp meds. Her systolic bp start to trend up to above 140's . She is discharged on low dose lisinopril.  F/u with pcp.  Code Status: full  Family Communication: patient   Disposition Plan: home on 8/12   Consultants:  Critical care  Procedures:  left IJ placement on 8/8, remove on 8/10  Antibiotics:  As above    Discharge Exam: BP (!) 153/99 (BP Location: Right Arm)   Pulse 85   Temp 98.4 F (36.9 C) (Oral)   Resp 18   Ht 5\' 4"  (1.626 m)   Wt 65.6 kg   LMP 04/01/2018   SpO2 99%   BMI 24.82 kg/m     General:  Awake, NAD  Cardiovascular: RRR  Respiratory: improved aeration, no wheezing, no rales  Abdomen: Soft/ND/NT, positive BS  Musculoskeletal: right foot edema, right foot wound on platar surface, mild erythema, no odor, no active drainage at time on inspection  Neuro: aaox3   Discharge Instructions You were cared for by a hospitalist during your  hospital stay. If you have any questions about your discharge medications or the care you received while you were in the hospital after you are discharged, you can call the unit and asked to speak with the hospitalist on call if the hospitalist that took care of you is not available. Once you are discharged, your primary care physician will handle any further medical issues. Please note that NO REFILLS for any discharge medications will be authorized once you are discharged, as it is imperative that you return to your primary care physician (or establish a relationship with a primary care physician  if you do not have one) for your aftercare needs so that they can reassess your need for medications and monitor your lab values.  Discharge Instructions    Diet Carb Modified   Complete by:  As directed    Increase activity slowly   Complete by:  As directed      Allergies as of 04/29/2018      Reactions   Morphine Itching   Codeine Nausea And Vomiting   Sulfa Antibiotics Nausea And Vomiting      Medication List    STOP taking these medications   ALPRAZolam 0.25 MG tablet Commonly known as:  XANAX   ARIPiprazole 10 MG tablet Commonly known as:  ABILIFY   azithromycin 250 MG tablet Commonly known as:  ZITHROMAX   clindamycin 300 MG capsule Commonly known as:  CLEOCIN   escitalopram 20 MG tablet Commonly known as:  LEXAPRO   insulin glulisine 100 UNIT/ML injection Commonly known as:  APIDRA     TAKE these medications   cetirizine 10 MG tablet Commonly known as:  ZYRTEC Take 10 mg by mouth daily.   ciprofloxacin 500 MG tablet Commonly known as:  CIPRO Take 1 tablet (500 mg total) by mouth 2 (two) times daily for 2 days.   furosemide 20 MG tablet Commonly known as:  LASIX Take 1 tablet (20 mg total) by mouth every other day as needed for fluid or edema. What changed:    when to take this  reasons to take this   HUMALOG 100 UNIT/ML cartridge Generic drug:  insulin lispro Inject into the skin 3 (three) times daily with meals. Sliding Scale   insulin glargine 100 UNIT/ML injection Commonly known as:  LANTUS Inject 0.05 mLs (5 Units total) into the skin 2 (two) times daily. What changed:  how much to take   lisinopril 2.5 MG tablet Commonly known as:  PRINIVIL,ZESTRIL Take 1 tablet (2.5 mg total) by mouth daily.   metroNIDAZOLE 500 MG tablet Commonly known as:  FLAGYL Take 1 tablet (500 mg total) by mouth every 8 (eight) hours for 2 days.   pantoprazole 20 MG tablet Commonly known as:  PROTONIX Take 1 tablet (20 mg total) by mouth daily. Start  taking on:  04/30/2018   saccharomyces boulardii 250 MG capsule Commonly known as:  FLORASTOR Take 1 capsule (250 mg total) by mouth 2 (two) times daily for 12 days.      Allergies  Allergen Reactions  . Morphine Itching  . Codeine Nausea And Vomiting  . Sulfa Antibiotics Nausea And Vomiting   Follow-up Information    Remus LofflerJones, Angel S, PA-C Follow up in 1 week(s).   Specialties:  Physician Assistant, Family Medicine Why:  hospital discharge follow up, repeat cbc/bmp at follow up. pcp to refer you to have sleep study to r/o sleep apnea Contact information: [address] [place] [address] [phone]  Jory Ee, FNP Follow up in 1 week(s).   Why:  for diabetes management Contact information: [address] [place] [address] [phone]            The results of significant diagnostics from this hospitalization (including imaging, microbiology, ancillary and laboratory) are listed below for reference.    Significant Diagnostic Studies: US Abdomen Limited  Result Date: 04/29/2018 CLINICAL DATA:  Elevated liver function studies. EXAM: ULTRASOUND ABDOMEN LIMITED RIGHT UPPER QUADRANT COMPARISON:  None. FINDINGS: Gallbladder: No gallstones or wall thickening visualized. No sonographic Murphy sign noted by sonographer. Tiny polypoid filling defects on the nondependent surface consistent with small polyps measuring less than 5 mm diameter. This is consistent with benign etiology. Common bile duct: Diameter: 2.7 mm, normal Liver: No focal lesion identified. Within normal limits in parenchymal echogenicity. Portal vein is patent on color Doppler imaging with normal direction of blood flow towards the liver. IMPRESSION: 1. Benign-appearing gallbladder polyps measuring less than 5 mm. 2. No evidence of cholelithiasis or cholecystitis. 3. Normal appearance of the liver. Electronically Signed   By: Burman Nieves M.D.   On: 04/29/2018  06:56   Dg Chest Port 1 View  Result Date: 04/25/2018 CLINICAL DATA:  Status post central line placement EXAM: PORTABLE CHEST 1 VIEW COMPARISON:  8/8/9 FINDINGS: Left jugular central line placement has been performed. Catheter tip is noted in the mid superior vena cava. No pneumothorax is seen. The inspiratory effort is poor with bilateral lower lobe patchy infiltrates worse on the left than the right. IMPRESSION: No pneumothorax following central line placement. Patchy infiltrative change particularly in the left lung base. Electronically Signed   By: Alcide Clever M.D.   On: 04/25/2018 09:59   Dg Chest Port 1 View  Result Date: 04/25/2018 CLINICAL DATA:  Diabetic ketoacidosis EXAM: PORTABLE CHEST 1 VIEW COMPARISON:  09/26/2012 FINDINGS: Low lung volumes with crowding of interstitial lung markings. Suggested mild interstitial edema on the LEs. No effusion or pneumothorax. No acute osseous abnormality. Heart size is top-normal. Aortic arch is not well visualized. IMPRESSION: Low lung volumes with interstitial edema suggested. Electronically Signed   By: Tollie Eth M.D.   On: 04/25/2018 02:37   Dg Foot 2 Views Right  Result Date: 04/25/2018 CLINICAL DATA:  Sores in the right foot.  Diabetic ketoacidosis. EXAM: RIGHT FOOT - 2 VIEW COMPARISON:  None. FINDINGS: Views of the right foot demonstrate soft tissue swelling of the included ankle and foot with soft tissue ulceration along the mid plantar aspect of the foot. The bones are demineralized in appearance. A cannulated screw traverses the posterior sub talar joint with a corticated screw traversing the first ray from head of first metatarsal terminating within the talus. A third screw is noted spanning the calcaneus, cuboid and base of third metatarsal. Partial midfoot ankylosis is noted. No frank bone destruction to suggest acute osteomyelitis. IMPRESSION: 1. Soft tissue ulceration along the plantar aspect of the midfoot with diffuse soft tissue swelling of  the included ankle and foot. 2. 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 3. Osteopenia without acute radiographic evidence of osteomyelitis. Electronically Signed   By: Tollie Eth M.D.   On: 04/25/2018 02:40    Microbiology: Recent Results (from the past 240 hour(s))  MRSA PCR Screening     Status: None   Collection Time: 04/25/18  7:21 AM  Result Value Ref Range  Status   MRSA by PCR NEGATIVE NEGATIVE Final    Comment:        The GeneXpert MRSA Assay (FDA approved for NASAL specimens only), is one component of a comprehensive MRSA colonization surveillance program. It is not intended to diagnose MRSA infection nor to guide or monitor treatment for MRSA infections. Performed at [facility], [address] [place], [place] [address]   Culture, blood (Routine X 2) w Reflex to ID Panel     Status: None (Preliminary result)   Collection Time: 04/25/18  8:53 AM  Result Value Ref Range Status   Specimen Description BLOOD LEFT ANTECUBITAL  Final   Special Requests   Final    BOTTLES DRAWN AEROBIC ONLY Blood Culture adequate volume   Culture   Final    NO GROWTH 4 DAYS Performed at [facility], [address], [place], [place] [address]    Report Status PENDING  Incomplete  Culture, blood (routine x 2)     Status: None (Preliminary result)   Collection Time: 04/25/18  9:11 AM  Result Value Ref Range Status   Specimen Description BLOOD CENTRAL LINE  Final   Special Requests   Final    BOTTLES DRAWN AEROBIC ONLY Blood Culture adequate volume   Culture   Final    NO GROWTH 4 DAYS Performed at [facility], [address] [place], [place] [address]    Report Status PENDING  Incomplete  C difficile quick scan w PCR reflex     Status: None   Collection Time: 04/27/18 12:27 PM  Result Value Ref Range Status   C Diff antigen NEGATIVE NEGATIVE Final   C Diff toxin NEGATIVE NEGATIVE  Final   C Diff interpretation No C. difficile detected.  Final    Comment: Performed at [facility], [address], [place], [place] [address]  Gastrointestinal Panel by PCR , Stool     Status: None   Collection Time: 04/27/18 12:27 PM  Result Value Ref Range Status   Campylobacter species NOT DETECTED NOT DETECTED Final   Plesimonas shigelloides NOT DETECTED NOT DETECTED Final   Salmonella species NOT DETECTED NOT DETECTED Final   Yersinia enterocolitica NOT DETECTED NOT DETECTED Final   Vibrio species NOT DETECTED NOT DETECTED Final   Vibrio cholerae NOT DETECTED NOT DETECTED Final   Enteroaggregative E coli (EAEC) NOT DETECTED NOT DETECTED Final   Enteropathogenic E coli (EPEC) NOT DETECTED NOT DETECTED Final   Enterotoxigenic E coli (ETEC) NOT DETECTED NOT DETECTED Final   Shiga like toxin producing E coli (STEC) NOT DETECTED NOT DETECTED Final   Shigella/Enteroinvasive E coli (EIEC) NOT DETECTED NOT DETECTED Final   Cryptosporidium NOT DETECTED NOT DETECTED Final   Cyclospora cayetanensis NOT DETECTED NOT DETECTED Final   Entamoeba histolytica NOT DETECTED NOT DETECTED Final   Giardia lamblia NOT DETECTED NOT DETECTED Final   Adenovirus F40/41 NOT DETECTED NOT DETECTED Final   Astrovirus NOT DETECTED NOT DETECTED Final   Norovirus GI/GII NOT DETECTED NOT DETECTED Final   Rotavirus A NOT DETECTED NOT DETECTED Final   Sapovirus (I, II, IV, and V) NOT DETECTED NOT DETECTED Final    Comment: Performed at [facility], [address] [place], [place] [address]     Labs: Basic Metabolic Panel: Recent Labs  Lab 04/25/18 0847  04/25/18 2112 04/26/18 0409 04/27/18 0508 04/28/18 0500 04/29/18 0449  NA 143   < > 141 144 138 138 139  K 3.4*   < > 3.9 3.5 2.9* 3.4* 2.8*  CL 115*   < > 114* 115* 107 105 100  CO2 11*   < > 20* 21* 23 20* 29  GLUCOSE 438*   < > 87 61* 111* 189* 179*  BUN 18   < > 11 10 <5* <5* <5*  CREATININE 1.69*   < > 0.98 0.88 0.72 0.66 0.60    CALCIUM 7.0*   < > 7.2* 7.6* 7.8* 8.2* 8.2*  MG 1.8  --   --   --   --  2.0 1.7  PHOS 2.7  --   --   --   --   --   --    < > = values in this interval not displayed.   Liver Function Tests: Recent Labs  Lab 04/25/18 0143 04/27/18 0508 04/28/18 0500 04/29/18 0449  AST 21 43* 53* 32  ALT 39 34 43 35  ALKPHOS 158* 112 146* 139*  BILITOT 2.7* 0.3 0.7 0.5  PROT 6.4* 5.4* 5.9* 5.8*  ALBUMIN 3.2* 2.5* 2.7* 2.6*   Recent Labs  Lab 04/29/18 0449  LIPASE 26   No results for input(s): AMMONIA in the last 168 hours. CBC: Recent Labs  Lab 04/25/18 0143 04/25/18 0847 04/27/18 0508 04/28/18 0500 04/29/18 0449  WBC 17.9* 26.3* 11.7* 5.5 5.3  NEUTROABS 15.4  --  9.0*  --   --   HGB 9.5* 9.2* 9.7* 10.5* 10.4*  HCT 34.5* 28.4* 29.0* 32.7* 32.1*  MCV 105.2* 91.6 89.2 90.8 89.4  PLT 423* 441* 278 314 273   Cardiac Enzymes: Recent Labs  Lab 04/25/18 0143  TROPONINI <0.03   BNP: BNP (last 3 results) No results for input(s): BNP in the last 8760 hours.  ProBNP (last 3 results) No results for input(s): PROBNP in the last 8760 hours.  CBG: Recent Labs  Lab 04/29/18 0013 04/29/18 0335 04/29/18 0410 04/29/18 0755 04/29/18 1208  GLUCAP 134* 41* 175* 157* 219*       Signed:  Albertine Grates MD, PhD  [facility] 04/29/2018, 2:28 PM

## 2018-04-29 NOTE — Progress Notes (Signed)
Hypoglycemic Event  CBG: 41  Treatment: D50 IV 50 mL  Symptoms: Sweaty  Follow-up CBG: Time:0410 CBG Result:175  Possible Reasons for Event: Unknown  Comments/MD notified:    Gregor Hamshacker, Kataleia Quaranta Lea

## 2018-04-29 NOTE — Progress Notes (Signed)
Inpatient Diabetes Program Recommendations  AACE/ADA: New Consensus Statement on Inpatient Glycemic Control (2019)  Target Ranges:  Prepandial:   less than 140 mg/dL      Peak postprandial:   less than 180 mg/dL (1-2 hours)      Critically ill patients:  140 - 180 mg/dL  Results for [name], Skiler (MRN [id number]) as of 04/29/2018 08:19  Ref. Range 04/28/2018 08:09 04/28/2018 11:58 04/28/2018 17:44 04/28/2018 20:21 04/29/2018 00:13 04/29/2018 03:35 04/29/2018 04:10 04/29/2018 07:55  Glucose-Capillary Latest Ref Range: 70 - 99 mg/dL 84 696113 (H) 295202 (H)  284105 (H) 134 (H) 41 (LL) 175 (H) 157 (H)   Results for [name], [name] (MRN [id number]) as of 04/29/2018 08:19  Ref. Range 04/27/2018 05:13  Hemoglobin A1C Latest Ref Range: 4.8 - 5.6 % 11.2 (H)   Review of Glycemic Control  Diabetes history: DM1 (makes no insulin; requires basal, correction and meal coverage insulin. Very sensitive to insulin) Outpatient Diabetes medications: Lantus 15 units QHS, Novolog for meal coverage and correction TID with meals Current orders for Inpatient glycemic control: Levemir 5 units Q12H, Novolog 3-9 units Q4H  Inpatient Diabetes Program Recommendations: Correction (SSI): Please consider changing CBGs and Novolog correction to custom scale of Novolog 0-5 units ACHS. -Custom Novolog correction scale 0-5 units ACHS      151-200  1 unit      201-250  2 units      251-300  3 units      301-350  4 units      351-400  5 units  Insulin - Meal Coverage: Please consider ordering Novolog 2 units TID with meals for meal coverage if patient eats at least 50% of meals. HgbA1C: A1C 11.2% on 04/27/2018 indicating an average glucose of 275 mg/dl over the past 2-3 months.   Thanks, Orlando PennerMarie Ruchama Kubicek, RN, MSN, CDE Diabetes Coordinator Inpatient Diabetes Program [phone] (Team Pager from 8am to 5pm)

## 2018-04-30 LAB — HEPATITIS PANEL, ACUTE
HEP A IGM: NEGATIVE
HEP B C IGM: NEGATIVE
HEP B S AG: NEGATIVE

## 2018-04-30 LAB — CULTURE, BLOOD (ROUTINE X 2)
CULTURE: NO GROWTH
Culture: NO GROWTH
SPECIAL REQUESTS: ADEQUATE
Special Requests: ADEQUATE

## 2018-04-30 NOTE — ED Notes (Signed)
Vancomycin was hung around 02:30 am. This occurred during down time and Vancomycin was pulled as an over-ride during down time under patient's name. Full dose was given over 1 hour prior to [facility] transport. This information [facility] to [facility]. Rikki SpearingJessica Braedon Sjogren RN

## 2018-04-30 NOTE — ED Notes (Signed)
Patient's second I- stat lactic was not drawn due to several attempts. Not able to obtain blood sample for re-peat I-stat lactic. Charge nurse notified.

## 2018-07-22 ENCOUNTER — Ambulatory Visit (INDEPENDENT_AMBULATORY_CARE_PROVIDER_SITE_OTHER): Payer: Self-pay | Admitting: Pediatrics

## 2018-07-22 ENCOUNTER — Encounter: Payer: Self-pay | Admitting: Pediatrics

## 2018-07-22 VITALS — BP 91/71 | HR 94 | Temp 96.8°F | Ht 64.0 in | Wt 137.4 lb

## 2018-07-22 DIAGNOSIS — R197 Diarrhea, unspecified: Secondary | ICD-10-CM

## 2018-07-22 NOTE — Progress Notes (Signed)
  Subjective:   Patient ID: [name], female    DOB: 08-08-1981, 37 y.o.   MRN: [id number] CC: Diarrhea  HPI: [name] is a 37 y.o. female   Started diarrhea 2 to 3 weeks ago.  Says she has had intermittent episodes of diarrhea since she lost her daughter in in a stillbirth 2017.  Most recent episode about 3 months ago.  At that time she was in the hospital for DKA.  C. difficile, stool studies were all negative.  She think she is having bowel movements up to 10-15 times a day.  Watery.  Sometimes some slight blood on the toilet paper she thinks just from irritation from frequent bowel movements.  No blood in the stools.  She thinks she might of had C. difficile in the past, not able to find a positive result in chart review.  She was recently on antibiotics following Charcot foot revision surgery.  No fevers.  Appetite has been okay.  Sometimes lightheaded when she stands up.  She has not been taking lisinopril.  Trying to drink lots of fluids.  Relevant past medical, surgical, family and social history reviewed. Allergies and medications reviewed and updated. Social History   Tobacco Use  Smoking Status Never Smoker  Smokeless Tobacco Never Used   ROS: Per HPI   Objective:    BP 91/71   Pulse 94   Temp (!) 96.8 F (36 C) (Oral)   Ht _0  (1.626 m)   Wt 137 lb 6.4 oz (62.3 kg)   BMI 23.58 kg/m   Wt Readings from Last 3 Encounters:  07/22/18 137 lb 6.4 oz (62.3 kg)  04/25/18 144 lb 10 oz (65.6 kg)  11/26/17 141 lb 3.2 oz (64 kg)    Gen: NAD, alert, cooperative with exam, NCAT EYES: EOMI, no conjunctival injection, or no icterus CV: NRRR, normal S1/S2, no murmur, distal pulses 2+ b/l Resp: CTABL, no wheezes, normal WOB Abd: +BS, soft, NTND. no guarding or organomegaly Ext: No edema, warm Neuro: Alert and oriented, strength equal b/l UE and LE, coordination grossly normal MSK: normal muscle bulk  Assessment & Plan:  Landi was seen today for  diarrhea.  Diagnoses and all orders for this visit:  Diarrhea, unspecified type Push fluids.  Will test below.  If not improving over next 24 hours, needs to return for blood work (CMP, CBC including ESR, CRP to evaluate for IBD.)  May need IV fluids. -     Clostridium difficile EIA   Follow up plan: 1-2 days if not improving. Assunta Found, MD [place]

## 2018-07-24 ENCOUNTER — Other Ambulatory Visit: Payer: Self-pay

## 2018-07-26 LAB — CLOSTRIDIUM DIFFICILE EIA: C difficile Toxins A+B, EIA: NEGATIVE

## 2018-08-08 ENCOUNTER — Telehealth: Payer: Self-pay | Admitting: Physician Assistant

## 2018-09-06 ENCOUNTER — Other Ambulatory Visit: Payer: Self-pay

## 2018-09-06 ENCOUNTER — Encounter (HOSPITAL_COMMUNITY): Payer: Self-pay | Admitting: *Deleted

## 2018-09-06 ENCOUNTER — Other Ambulatory Visit: Payer: Self-pay | Admitting: Ophthalmology

## 2018-09-06 NOTE — Progress Notes (Signed)
Spoke with pt for pre-op call. Pt denies cardiac history or HTN. Pt is a type 1 diabetic. Last A1C was 11.2 on 04/27/18. Pt states her medications were adjusted then. She states has not had a repeat A1C. Pt states her fasting blood sugar is usually around 120. Instructed pt to take 80% of her regular dose of Lantus insulin, she will take 4 units and also on Sunday AM will take 4 units. Instructed pt to check her blood sugar when she gets up and every 2 hours until she leaves for the hospital. If blood sugar is >220 take 1/2 of usual correction dose of Humalog insulin. If blood sugar is 70 or below, treat with 1/2 cup of clear juice (apple or cranberry) and recheck blood sugar 15 minutes after drinking juice. If blood sugar continues to be 70 or below, call the Short Stay department and ask to speak to a nurse. Pt voiced understanding.  I called Dr. Allyne GeeSanders' office and spoke with Candise BowensJen, his scheduler to make sure Dr. Allyne GeeSanders was aware of pt's A1C result. She checked with him and he does know about it. He feels that she really needs this surgery because her eyesight is so poor and she might not be giving herself the right dose of insulin because of the poor vision.
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
## 2018-09-06 NOTE — Anesthesia Preprocedure Evaluation (Addendum)
Anesthesia Evaluation  Patient identified by MRN, date of birth, ID band Patient awake    Reviewed: Allergy & Precautions, NPO status , Patient's Chart, lab work & pertinent test results  Airway Mallampati: II  TM Distance: >3 FB Neck ROM: Full    Dental no notable dental hx. 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  Neuromuscular disease negative neurological ROS     GI/Hepatic negative GI ROS, Neg liver ROS,   Endo/Other  diabetes, Poorly Controlled, Type 1, Insulin DependentLast episode of DKA 04/2018  Renal/GU negative Renal ROS     Musculoskeletal  (+) Arthritis , Osteoarthritis,  Charcot's joint right ankle   Abdominal   Peds  Hematology  (+) anemia ,   Anesthesia Other Findings   Reproductive/Obstetrics                            Anesthesia Physical Anesthesia Plan  ASA: III  Anesthesia Plan: General   Post-op Pain Management:    Induction: Intravenous  PONV Risk Score and Plan: 4 or greater and Ondansetron and Treatment may vary due to age or medical condition  Airway Management Planned: Oral ETT  Additional Equipment:   Intra-op Plan:   Post-operative Plan: Extubation in OR  Informed Consent: I have reviewed the patients History and Physical, chart, labs and discussed the procedure including the risks, benefits and alternatives for the proposed anesthesia with the patient or authorized representative who has indicated his/her understanding and acceptance.   Dental advisory given  Plan Discussed with: CRNA and Surgeon  Anesthesia Plan Comments: (PAT note  written 09/06/2018 by Shonna ChockAllison Zelenak, PA-C. She is a same day work-up. History of poorly controlled DM1. )      Anesthesia Quick Evaluation

## 2018-09-07 ENCOUNTER — Other Ambulatory Visit: Payer: Self-pay | Admitting: Physician Assistant

## 2018-09-07 MED ORDER — CLINDAMYCIN HCL 300 MG PO CAPS: 300.0000 mg | ORAL_CAPSULE | Freq: Three times a day (TID) | ORAL | 1 refills | Status: DC

## 2018-09-09 ENCOUNTER — Ambulatory Visit (HOSPITAL_COMMUNITY)
Admission: RE | Admit: 2018-09-09 | Discharge: 2018-09-09 | Disposition: A | Discharge status: Home / Self Care | Payer: Self-pay | Attending: Ophthalmology | Admitting: Ophthalmology

## 2018-09-09 ENCOUNTER — Encounter (HOSPITAL_COMMUNITY)
Admission: RE | Disposition: A | Discharge status: Home / Self Care | Payer: Self-pay | Source: Home / Self Care | Attending: Ophthalmology

## 2018-09-09 ENCOUNTER — Ambulatory Visit (HOSPITAL_COMMUNITY): Payer: Self-pay | Admitting: Vascular Surgery

## 2018-09-09 ENCOUNTER — Encounter (HOSPITAL_COMMUNITY): Payer: Self-pay | Admitting: General Practice

## 2018-09-09 ENCOUNTER — Other Ambulatory Visit: Payer: Self-pay

## 2018-09-09 DIAGNOSIS — E1065 Type 1 diabetes mellitus with hyperglycemia: Secondary | ICD-10-CM | POA: Insufficient documentation

## 2018-09-09 DIAGNOSIS — E083599 Diabetes mellitus due to underlying condition with proliferative diabetic retinopathy without macular edema, unspecified eye: Secondary | ICD-10-CM

## 2018-09-09 DIAGNOSIS — E103591 Type 1 diabetes mellitus with proliferative diabetic retinopathy without macular edema, right eye: Secondary | ICD-10-CM | POA: Insufficient documentation

## 2018-09-09 DIAGNOSIS — H4310 Vitreous hemorrhage, unspecified eye: Secondary | ICD-10-CM

## 2018-09-09 DIAGNOSIS — H4311 Vitreous hemorrhage, right eye: Secondary | ICD-10-CM | POA: Insufficient documentation

## 2018-09-09 DIAGNOSIS — E1040 Type 1 diabetes mellitus with diabetic neuropathy, unspecified: Secondary | ICD-10-CM | POA: Insufficient documentation

## 2018-09-09 DIAGNOSIS — Z794 Long term (current) use of insulin: Secondary | ICD-10-CM | POA: Insufficient documentation

## 2018-09-09 DIAGNOSIS — Z79899 Other long term (current) drug therapy: Secondary | ICD-10-CM | POA: Insufficient documentation

## 2018-09-09 DIAGNOSIS — E10610 Type 1 diabetes mellitus with diabetic neuropathic arthropathy: Secondary | ICD-10-CM | POA: Insufficient documentation

## 2018-09-09 DIAGNOSIS — D649 Anemia, unspecified: Secondary | ICD-10-CM | POA: Insufficient documentation

## 2018-09-09 HISTORY — DX: Anxiety disorder, unspecified: F41.9

## 2018-09-09 HISTORY — DX: Hypotension, unspecified: I95.9

## 2018-09-09 HISTORY — DX: Anemia, unspecified: D64.9

## 2018-09-09 HISTORY — PX: PARS PLANA VITRECTOMY: SHX2166

## 2018-09-09 HISTORY — PX: MEMBRANE PEEL: SHX5967

## 2018-09-09 HISTORY — PX: PHOTOCOAGULATION WITH LASER: SHX6027

## 2018-09-09 LAB — CBC
HCT: 31.7 % — ABNORMAL LOW (ref 36.0–46.0)
HEMOGLOBIN: 9.9 g/dL — AB (ref 12.0–15.0)
MCH: 28.3 pg (ref 26.0–34.0)
MCHC: 31.2 g/dL (ref 30.0–36.0)
MCV: 90.6 fL (ref 80.0–100.0)
Platelets: 537 10*3/uL — ABNORMAL HIGH (ref 150–400)
RBC: 3.5 MIL/uL — ABNORMAL LOW (ref 3.87–5.11)
RDW: 13 % (ref 11.5–15.5)
WBC: 8.4 10*3/uL (ref 4.0–10.5)
nRBC: 0 % (ref 0.0–0.2)

## 2018-09-09 LAB — BASIC METABOLIC PANEL
Anion gap: 9 (ref 5–15)
BUN: 8 mg/dL (ref 6–20)
CO2: 30 mmol/L (ref 22–32)
Calcium: 8.5 mg/dL — ABNORMAL LOW (ref 8.9–10.3)
Chloride: 98 mmol/L (ref 98–111)
Creatinine, Ser: 0.57 mg/dL (ref 0.44–1.00)
GFR calc Af Amer: >60 mL/min (ref >60)
GFR calc non Af Amer: >60 mL/min (ref >60)
GLUCOSE: 104 mg/dL — AB (ref 70–99)
Potassium: 3.2 mmol/L — ABNORMAL LOW (ref 3.5–5.1)
Sodium: 137 mmol/L (ref 135–145)

## 2018-09-09 LAB — GLUCOSE, CAPILLARY
Glucose-Capillary: 99 mg/dL (ref 70–99)
Glucose-Capillary: 96 mg/dL (ref 70–99)
Glucose-Capillary: 138 mg/dL — ABNORMAL HIGH (ref 70–99)
Glucose-Capillary: 175 mg/dL — ABNORMAL HIGH (ref 70–99)

## 2018-09-09 LAB — POCT PREGNANCY, URINE: Preg Test, Ur: NEGATIVE

## 2018-09-09 SURGERY — PARS PLANA VITRECTOMY WITH 25 GAUGE
Anesthesia: General | Site: Eye | Laterality: Right

## 2018-09-09 SURGERY — PARS PLANA VITRECTOMY WITH 25 GAUGE
Anesthesia: Choice | Laterality: Right

## 2018-09-09 MED ORDER — EPINEPHRINE PF 1 MG/ML IJ SOLN
INTRAMUSCULAR | Status: AC
  Filled 2018-09-09: qty 1

## 2018-09-09 MED ORDER — PHENYLEPHRINE HCL 2.5 % OP SOLN
1.0000 [drp] | OPHTHALMIC | Status: AC
  Administered 2018-09-09 (×3): 1 [drp] via OPHTHALMIC
  Filled 2018-09-09: qty 2

## 2018-09-09 MED ORDER — ONDANSETRON HCL 4 MG/2ML IJ SOLN
INTRAMUSCULAR | Status: DC | PRN
  Administered 2018-09-09: 4 mg via INTRAVENOUS

## 2018-09-09 MED ORDER — LIDOCAINE 2% (20 MG/ML) 5 ML SYRINGE
INTRAMUSCULAR | Status: AC
  Filled 2018-09-09: qty 5

## 2018-09-09 MED ORDER — ATROPINE SULFATE 1 % OP SOLN
OPHTHALMIC | Status: DC | PRN
  Administered 2018-09-09: 1 [drp] via OPHTHALMIC

## 2018-09-09 MED ORDER — DEXAMETHASONE SODIUM PHOSPHATE 10 MG/ML IJ SOLN
INTRAMUSCULAR | Status: DC | PRN
  Administered 2018-09-09: 8 mg via INTRAVENOUS

## 2018-09-09 MED ORDER — FENTANYL CITRATE (PF) 100 MCG/2ML IJ SOLN
50.0000 ug | INTRAMUSCULAR | Status: DC | PRN
  Administered 2018-09-09: 50 ug via INTRAVENOUS

## 2018-09-09 MED ORDER — MIDAZOLAM HCL 5 MG/5ML IJ SOLN
INTRAMUSCULAR | Status: DC | PRN
  Administered 2018-09-09: 2 mg via INTRAVENOUS

## 2018-09-09 MED ORDER — ONDANSETRON HCL 4 MG/2ML IJ SOLN
INTRAMUSCULAR | Status: AC
  Filled 2018-09-09: qty 2

## 2018-09-09 MED ORDER — PROPOFOL 10 MG/ML IV BOLUS
INTRAVENOUS | Status: DC | PRN
  Administered 2018-09-09: 150 mg via INTRAVENOUS

## 2018-09-09 MED ORDER — CEFAZOLIN SUBCONJUNCTIVAL INJECTION 100 MG/0.5 ML
INJECTION | SUBCONJUNCTIVAL | Status: DC | PRN
  Administered 2018-09-09: 100 mg via SUBCONJUNCTIVAL

## 2018-09-09 MED ORDER — DEXAMETHASONE SODIUM PHOSPHATE 10 MG/ML IJ SOLN
INTRAMUSCULAR | Status: AC
  Filled 2018-09-09: qty 1

## 2018-09-09 MED ORDER — NA CHONDROIT SULF-NA HYALURON 40-30 MG/ML IO SOLN
INTRAOCULAR | Status: AC
  Filled 2018-09-09: qty 0.5

## 2018-09-09 MED ORDER — LIDOCAINE 2% (20 MG/ML) 5 ML SYRINGE
INTRAMUSCULAR | Status: DC | PRN
  Administered 2018-09-09: 40 mg via INTRAVENOUS

## 2018-09-09 MED ORDER — ATROPINE SULFATE 1 % OP SOLN
1.0000 [drp] | OPHTHALMIC | Status: AC
  Administered 2018-09-09 (×3): 1 [drp] via OPHTHALMIC
  Filled 2018-09-09: qty 2

## 2018-09-09 MED ORDER — PROVISC 10 MG/ML IO SOLN
INTRAOCULAR | Status: DC | PRN
  Administered 2018-09-09: .85 mL via INTRAOCULAR

## 2018-09-09 MED ORDER — CEFAZOLIN SUBCONJUNCTIVAL INJECTION 100 MG/0.5 ML
100.0000 mg | INJECTION | SUBCONJUNCTIVAL | Status: DC
  Filled 2018-09-09: qty 5

## 2018-09-09 MED ORDER — SUGAMMADEX SODIUM 200 MG/2ML IV SOLN
INTRAVENOUS | Status: DC | PRN
  Administered 2018-09-09: 200 mg via INTRAVENOUS

## 2018-09-09 MED ORDER — LIDOCAINE HCL 2 % IJ SOLN
INTRAMUSCULAR | Status: DC | PRN
  Administered 2018-09-09: 6 mL via RETROBULBAR

## 2018-09-09 MED ORDER — SODIUM HYALURONATE 10 MG/ML IO SOLN
INTRAOCULAR | Status: AC
  Filled 2018-09-09: qty 0.85

## 2018-09-09 MED ORDER — FENTANYL CITRATE (PF) 250 MCG/5ML IJ SOLN
INTRAMUSCULAR | Status: AC
  Filled 2018-09-09: qty 5

## 2018-09-09 MED ORDER — TETRACAINE HCL 0.5 % OP SOLN
OPHTHALMIC | Status: AC
  Filled 2018-09-09: qty 4

## 2018-09-09 MED ORDER — BALANCED SALT IO SOLN
INTRAOCULAR | Status: DC | PRN
  Administered 2018-09-09: 15 mL via INTRAOCULAR

## 2018-09-09 MED ORDER — MIDAZOLAM HCL 2 MG/2ML IJ SOLN
INTRAMUSCULAR | Status: AC
  Filled 2018-09-09: qty 2

## 2018-09-09 MED ORDER — BUPIVACAINE HCL (PF) 0.75 % IJ SOLN
INTRAMUSCULAR | Status: AC
  Filled 2018-09-09: qty 10

## 2018-09-09 MED ORDER — TOBRAMYCIN-DEXAMETHASONE 0.3-0.1 % OP OINT
TOPICAL_OINTMENT | OPHTHALMIC | Status: AC
  Filled 2018-09-09: qty 3.5

## 2018-09-09 MED ORDER — BSS IO SOLN
INTRAOCULAR | Status: AC
  Filled 2018-09-09: qty 15

## 2018-09-09 MED ORDER — SODIUM CHLORIDE 0.9 % IV SOLN
INTRAVENOUS | Status: DC
  Administered 2018-09-09 (×2): via INTRAVENOUS

## 2018-09-09 MED ORDER — PHENYLEPHRINE HCL 2.5 % OP SOLN: 1.0000 [drp] | OPHTHALMIC | Status: DC

## 2018-09-09 MED ORDER — BSS PLUS IO SOLN
INTRAOCULAR | Status: AC
  Filled 2018-09-09: qty 500

## 2018-09-09 MED ORDER — DEXAMETHASONE SODIUM PHOSPHATE 10 MG/ML IJ SOLN
INTRAMUSCULAR | Status: DC | PRN
  Administered 2018-09-09: 10 mg via INTRAMUSCULAR

## 2018-09-09 MED ORDER — EPINEPHRINE PF 1 MG/ML IJ SOLN
INTRAOCULAR | Status: DC | PRN
  Administered 2018-09-09: 500 mL

## 2018-09-09 MED ORDER — ROCURONIUM BROMIDE 10 MG/ML (PF) SYRINGE
PREFILLED_SYRINGE | INTRAVENOUS | Status: DC | PRN
  Administered 2018-09-09: 50 mg via INTRAVENOUS

## 2018-09-09 MED ORDER — PROPOFOL 10 MG/ML IV BOLUS
INTRAVENOUS | Status: AC
  Filled 2018-09-09: qty 20

## 2018-09-09 MED ORDER — SODIUM CHLORIDE 0.9 % IV SOLN
INTRAVENOUS | Status: DC | PRN
  Administered 2018-09-09: 25 ug/min via INTRAVENOUS

## 2018-09-09 MED ORDER — SUCCINYLCHOLINE CHLORIDE 20 MG/ML IJ SOLN
INTRAMUSCULAR | Status: DC | PRN
  Administered 2018-09-09: 100 mg via INTRAVENOUS

## 2018-09-09 MED ORDER — LIDOCAINE HCL 2 % IJ SOLN
INTRAMUSCULAR | Status: AC
  Filled 2018-09-09: qty 20

## 2018-09-09 MED ORDER — ATROPINE SULFATE 1 % OP SOLN: 1.0000 [drp] | OPHTHALMIC | Status: DC

## 2018-09-09 MED ORDER — PHENYLEPHRINE HCL 10 MG/ML IJ SOLN
INTRAMUSCULAR | Status: DC | PRN
  Administered 2018-09-09 (×3): 120 ug via INTRAVENOUS

## 2018-09-09 MED ORDER — FENTANYL CITRATE (PF) 100 MCG/2ML IJ SOLN
INTRAMUSCULAR | Status: AC
  Administered 2018-09-09: 50 ug via INTRAVENOUS
  Filled 2018-09-09: qty 2

## 2018-09-09 MED ORDER — TOBRAMYCIN-DEXAMETHASONE 0.3-0.1 % OP OINT
TOPICAL_OINTMENT | OPHTHALMIC | Status: DC | PRN
  Administered 2018-09-09: 1 via OPHTHALMIC

## 2018-09-09 MED ORDER — ATROPINE SULFATE 1 % OP SOLN
OPHTHALMIC | Status: AC
  Filled 2018-09-09: qty 5

## 2018-09-09 MED ORDER — HYALURONIDASE HUMAN 150 UNIT/ML IJ SOLN
INTRAMUSCULAR | Status: AC
  Filled 2018-09-09: qty 1

## 2018-09-09 SURGICAL SUPPLY — 65 items
ACCESSORY FRAGMATOME (MISCELLANEOUS) ×2 IMPLANT
APPLICATOR DR MATTHEWS STRL (MISCELLANEOUS) ×2 IMPLANT
BLADE 10 SAFETY STRL DISP (BLADE) ×2 IMPLANT
BLADE MINI 60D BLUE (BLADE) IMPLANT
BLADE MVR KNIFE 20G (BLADE) IMPLANT
CANNULA ANT CHAM MAIN (OPHTHALMIC RELATED) IMPLANT
CANNULA ANTERIOR CHAMBER 27GA (MISCELLANEOUS) IMPLANT
CANNULA DUAL BORE 23G (CANNULA) IMPLANT
CANNULA DUALBORE 25G (CANNULA) IMPLANT
CANNULA VLV SOFT TIP 25GA (OPHTHALMIC) ×2 IMPLANT
CAUTERY EYE LOW TEMP 1300F FIN (OPHTHALMIC RELATED) ×2 IMPLANT
CLSR STERI-STRIP ANTIMIC 1/2X4 (GAUZE/BANDAGES/DRESSINGS) ×2 IMPLANT
CORD BIPOLAR FORCEPS 12FT (ELECTRODE) ×2 IMPLANT
CORDS BIPOLAR (ELECTRODE) ×2 IMPLANT
COVER MAYO STAND STRL (DRAPES) ×2 IMPLANT
COVER WAND RF STERILE (DRAPES) ×2 IMPLANT
DRAPE HALF SHEET 40X57 (DRAPES) ×2 IMPLANT
DRAPE INCISE 51X51 W/FILM STRL (DRAPES) ×2 IMPLANT
DRAPE RETRACTOR (MISCELLANEOUS) ×2 IMPLANT
ERASER HMR WETFIELD 23G BP (MISCELLANEOUS) IMPLANT
FILTER BLUE MILLIPORE (MISCELLANEOUS) IMPLANT
FORCEPS ECKARDT ILM 25G SERR (OPHTHALMIC RELATED) IMPLANT
FORCEPS GRIESHABER ILM 25G A (INSTRUMENTS) ×2 IMPLANT
FORCEPS ILM 25G DSP TIP (MISCELLANEOUS) IMPLANT
GAS AUTO FILL CONSTEL (OPHTHALMIC)
GAS AUTO FILL CONSTELLATION (OPHTHALMIC) IMPLANT
GLOVE BIO SURGEON STRL SZ7.5 (GLOVE) ×2 IMPLANT
GOWN STRL REUS W/ TWL LRG LVL3 (GOWN DISPOSABLE) ×2 IMPLANT
GOWN STRL REUS W/TWL LRG LVL3 (GOWN DISPOSABLE) ×2
HANDLE PNEUMATIC FOR CONSTEL (OPHTHALMIC) ×2 IMPLANT
KIT BASIN OR (CUSTOM PROCEDURE TRAY) ×2 IMPLANT
KIT PERFLUORON PROCEDURE 5ML (MISCELLANEOUS) IMPLANT
KIT TURNOVER KIT B (KITS) IMPLANT
LENS BIOM SUPER VIEW SET DISP (OPHTHALMIC RELATED) ×2 IMPLANT
MICROPICK 25G (MISCELLANEOUS)
NEEDLE 18GX1X1/2 (RX/OR ONLY) (NEEDLE) ×2 IMPLANT
NEEDLE 25GX 5/8IN NON SAFETY (NEEDLE) ×4 IMPLANT
NEEDLE FILTER BLUNT 18X 1/2SAF (NEEDLE)
NEEDLE FILTER BLUNT 18X1 1/2 (NEEDLE) IMPLANT
NEEDLE HYPO 25GX1X1/2 BEV (NEEDLE) IMPLANT
NEEDLE HYPO 30X.5 LL (NEEDLE) ×2 IMPLANT
NEEDLE RETROBULBAR 25GX1.5 (NEEDLE) ×2 IMPLANT
NS IRRIG 1000ML POUR BTL (IV SOLUTION) ×2 IMPLANT
PACK FRAGMATOME (OPHTHALMIC) IMPLANT
PAD ARMBOARD 7.5X6 YLW CONV (MISCELLANEOUS) ×4 IMPLANT
PAK PIK VITRECTOMY CVS 25GA (OPHTHALMIC) ×2 IMPLANT
PAK VITRECTOMY PIK 25 GA (OPHTHALMIC RELATED) ×2 IMPLANT
PENCIL BIPOLAR 25GA STR DISP (OPHTHALMIC RELATED) ×2 IMPLANT
PICK MICROPICK 25G (MISCELLANEOUS) IMPLANT
PROBE LASER ILLUM FLEX CVD 25G (OPHTHALMIC) ×2 IMPLANT
RETRACTOR IRIS FLEX 25G GRIESH (INSTRUMENTS) IMPLANT
ROLLS DENTAL (MISCELLANEOUS) IMPLANT
SCRAPER DIAMOND 25GA (OPHTHALMIC RELATED) IMPLANT
STOCKINETTE IMPERVIOUS 9X36 MD (GAUZE/BANDAGES/DRESSINGS) ×4 IMPLANT
STOPCOCK 4 WAY LG BORE MALE ST (IV SETS) IMPLANT
SUT ETHILON 8 0 TG100 8 (SUTURE) IMPLANT
SUT VICRYL 7 0 TG140 8 (SUTURE) IMPLANT
SUT VICRYL 8 0 TG140 8 (SUTURE) IMPLANT
SUT VICRYL ABS 6-0 S29 18IN (SUTURE) IMPLANT
SYR 10ML LL (SYRINGE) IMPLANT
SYR 20CC LL (SYRINGE) ×2 IMPLANT
SYR 5ML LL (SYRINGE) ×2 IMPLANT
SYR TB 1ML LUER SLIP (SYRINGE) IMPLANT
TUBE CONNECTING 12X1/4 (SUCTIONS) IMPLANT
WATER STERILE IRR 1000ML POUR (IV SOLUTION) ×2 IMPLANT

## 2018-09-09 NOTE — Brief Op Note (Signed)
09/09/2018  5:38 PM  PATIENT:  [name] ClossJennifer Soyars-Martin  37 y.o. female  PRE-OPERATIVE DIAGNOSIS:  PDR OU, TRD OU, VH OU  POST-OPERATIVE DIAGNOSIS:  PDR OU, TRD OU, VH OU  PROCEDURE:  Procedure(s): PARS PLANA VITRECTOMY WITH 25 GAUGE (Right) PHOTOCOAGULATION WITH LASER (Right) MEMBRANE PEEL (Right)  SURGEON:  Surgeon(s) and Role:    Stephannie Li* Fatumata Kashani, MD - Primary  PHYSICIAN ASSISTANT:   ASSISTANTS: none   ANESTHESIA:   general  EBL:  0 mL   BLOOD ADMINISTERED:none  DRAINS: none   LOCAL MEDICATIONS USED:  BUPIVICAINE   SPECIMEN:  No Specimen  DISPOSITION OF SPECIMEN:  N/A  COUNTS:  YES  TOURNIQUET:  * No tourniquets in log *  DICTATION: .Other Dictation: Dictation Number [id number]  PLAN OF CARE: Discharge to home after PACU  PATIENT DISPOSITION:  PACU - hemodynamically stable.   Delay start of Pharmacological VTE agent (>24hrs) due to surgical blood loss or risk of bleeding: not applicable
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
## 2018-09-09 NOTE — Transfer of Care (Signed)
Immediate Anesthesia Transfer of Care Note  Patient: [name]  Procedure(s) Performed: PARS PLANA VITRECTOMY WITH 25 GAUGE (Right Eye) PHOTOCOAGULATION WITH LASER (Right Eye) MEMBRANE PEEL (Right Eye)  Patient Location: PACU  Anesthesia Type:General  Level of Consciousness: awake and alert   Airway & Oxygen Therapy: Patient Spontanous Breathing and Patient connected to nasal cannula oxygen  Post-op Assessment: Report given to RN and Post -op Vital signs reviewed and stable  Post vital signs: Reviewed and stable  Last Vitals:  Vitals Value Taken Time  BP 126/81 09/09/2018  5:42 PM  Temp 36.3 C 09/09/2018  5:42 PM  Pulse 88 09/09/2018  5:43 PM  Resp 14 09/09/2018  5:43 PM  SpO2 94 % 09/09/2018  5:43 PM  Vitals shown include unvalidated device data.  Last Pain:  Vitals:   09/09/18 1742  TempSrc:   PainSc: 0-No pain      Patients Stated Pain Goal: 3 (09/09/18 1500)  Complications: No apparent anesthesia complications

## 2018-09-09 NOTE — Anesthesia Procedure Notes (Signed)
Procedure Name: Intubation Date/Time: 09/09/2018 4:29 PM Performed by: Inda Coke, CRNA Pre-anesthesia Checklist: Patient identified, Emergency Drugs available, Suction available and Patient being monitored Patient Re-evaluated:Patient Re-evaluated prior to induction Oxygen Delivery Method: Circle System Utilized Preoxygenation: Pre-oxygenation with 100% oxygen Induction Type: IV induction, Rapid sequence and Cricoid Pressure applied Ventilation: Mask ventilation without difficulty Laryngoscope Size: Mac and 3 Grade View: Grade I Tube type: Oral Tube size: 7.0 mm Number of attempts: 1 Airway Equipment and Method: Stylet and Oral airway Placement Confirmation: ETT inserted through vocal cords under direct vision,  positive ETCO2 and breath sounds checked- equal and bilateral Secured at: 21 cm Tube secured with: Tape Dental Injury: Teeth and Oropharynx as per pre-operative assessment

## 2018-09-10 ENCOUNTER — Encounter (HOSPITAL_COMMUNITY): Payer: Self-pay | Admitting: Ophthalmology

## 2018-09-10 NOTE — Op Note (Signed)
NAMHughie Closs: [name], [name] MEDICAL RECORD [id number] ACCOUNT [id number] DATE OF BIRTH:06/22/1981 FACILITY: [facility] LOCATION: [facility]-PER[facility] PHYSICIAN:Lochlyn Zullo Adele DanB. Wade Sigala, MD  HISTORY AND PHYSICAL  DATE OF ADMISSION:  09/09/2018  SURGEON:  Barbara CowerJason B. Allyne GeeSanders, MD   ANESTHESIA:  General with endotracheal intubation.  PREOPERATIVE DIAGNOSES: 1.  Proliferative diabetic retinopathy, right eye. 2.  Vitreous hemorrhage, right eye. 3.  Neovascular membranes, right eye.  POSTOPERATIVE DIAGNOSES:   1.  Proliferative diabetic retinopathy, right eye. 2.  Vitreous hemorrhage, right eye. 3.  Neovascular membranes, right eye.  OPERATION PROCEDURE:  Pars plana vitrectomy with endolaser endocautery membrane peeling, right eye.  COMPLICATIONS:  None.  FINDINGS:  There was advanced diabetic retinal pathology with early traction and neovascular tissue and vitreous hemorrhage.  DESCRIPTION OF PROCEDURE:  The patient was identified in the preoperative holding area.  She was then taken to the operating room where she was sedated by the anesthesia team.  Once she was stable under general anesthesia with endotracheal intubation,  the right eye was prepped and draped in the usual sterile fashion for ocular surgery.  The right eye was then anesthetized using a retrobulbar block consisting of a 1:1 mixture of 0.75% bupivacaine and 1% lidocaine and 150 units of Hylenex.  A   retrobulbar block was placed using a retrobulbar needle in the right orbit.  The orbit did not incur any bleeding.  The needle was removed.  Once there was excellent akinesia and anesthesia obtained in the right eye, the right eye was prepped and draped  in the usual sterile fashion for ocular surgery.  A Lieberman speculum was placed between the right upper and lower eyelids for exposure.  Then 25-gauge trocars were introduced to introduce transconjunctival cannulas in the inferior temporal, superior temporal and superonasal quadrants.  The  trocars were removed, leaving the cannulas in place.  An infusion cannula was attached into the  inferior temporal cannula and confirmed to be in the vitreous cavity prior to its use.  The eye then underwent a core vitrectomy with the vitreous cutter and light pipe.  There were advanced vitreous adhesions to neovascular tissue along the arcades and  optic nerve head.  This was dissected carefully using the vitrector.  In-grasping forceps were also used to gently peel neovascular tissue off the arcades and optic nerve head.  Once this was performed, all anterior, posterior traction of the vitreous  was relieved without difficulty.  Endocautery was then used to obtain hemostasis.  Additional hemorrhage was removed off the macular surface using a soft tip extrusion cannula.  The eye looked very good at this point in time and hemostasis had been  achieved.  Next, endolaser photocoagulation was applied in a panretinal pattern using endolaser probe.  This was performed 360 degrees in the peripheral retina without difficulty.  The eye was inspected with scleral depression.  There is no retinal tear  detachment seen by careful scleral depressed exam.  Therefore, the cannulas were removed from the sclerotomies.  The sclerotomies were inspected and found to be watertight.  The eye was treated with subconjunctival injections of 50 mg of Ancef to 1 mg of  dexamethasone.  The eye was treated topically with 1 drop of 1% atropine and TobraDex ointment.  The speculum was removed.  Eyelids were cleaned and closed with a patch and shield.    The patient was then taken to recovery in excellent condition, having tolerated the procedure very well.  AN/NUANCE  D:09/09/2018 T:09/10/2018 JOB:[id number]

## 2018-09-12 NOTE — Anesthesia Postprocedure Evaluation (Signed)
Anesthesia Post Note  Patient: Hughie ClossJennifer Soyars-Martin  Procedure(s) Performed: PARS PLANA VITRECTOMY WITH 25 GAUGE (Right Eye) PHOTOCOAGULATION WITH LASER (Right Eye) MEMBRANE PEEL (Right Eye)     Patient location during evaluation: PACU Anesthesia Type: General Level of consciousness: awake and alert, patient cooperative and oriented Pain management: pain level controlled Vital Signs Assessment: post-procedure vital signs reviewed and stable Respiratory status: spontaneous breathing, nonlabored ventilation and respiratory function stable Cardiovascular status: blood pressure returned to baseline and stable Postop Assessment: no apparent nausea or vomiting Anesthetic complications: no Comments: *delayed entry, pt eval in PACU post op    Last Vitals:  Vitals:   09/09/18 1756 09/09/18 1807  BP: 102/65 105/64  Pulse: 89 89  Resp: 20 14  Temp:  (!) 36.3 C  SpO2: 93% 94%    Last Pain:  Vitals:   09/09/18 1755  TempSrc:   PainSc: 0-No pain                 Jenniffer Vessels,E. Rayner Erman

## 2018-09-16 ENCOUNTER — Other Ambulatory Visit: Payer: Self-pay | Admitting: Physician Assistant

## 2018-09-16 MED ORDER — ALPRAZOLAM 0.5 MG PO TABS: 0.5000 mg | ORAL_TABLET | Freq: Two times a day (BID) | ORAL | 0 refills | Status: DC | PRN

## 2018-09-25 ENCOUNTER — Other Ambulatory Visit: Payer: Self-pay | Admitting: Ophthalmology

## 2018-10-16 ENCOUNTER — Other Ambulatory Visit: Payer: Self-pay | Admitting: Ophthalmology

## 2018-10-16 ENCOUNTER — Other Ambulatory Visit: Payer: Self-pay | Admitting: Physician Assistant

## 2018-10-18 ENCOUNTER — Other Ambulatory Visit: Payer: Self-pay | Admitting: Ophthalmology

## 2018-10-18 ENCOUNTER — Encounter (HOSPITAL_COMMUNITY): Payer: Self-pay | Admitting: *Deleted

## 2018-10-18 ENCOUNTER — Other Ambulatory Visit: Payer: Self-pay
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
## 2018-10-18 NOTE — Progress Notes (Signed)
Spoke with pt for pre-op call. Pt just had surgery on her right eye in December. She states nothing has changed with her medical and surgical history. I did ask her about her insulin. She saw her doctor earlier this month and from the notes it looks like her Lantus insulin was changed to Basaglar and the dose to 16 units once a day. Lantus is 5 unit twice a day. Pt denies this and states she is in contact with her doctor about her blood sugars. Her A1C was 13.1 on 09/23/18. Pt states her fasting blood sugar was 115 this morning. Instructed pt to take 80% of her Lantus insulin Sunday PM (she will take 4 units). Instructed her to check her blood sugar when she gets up Monday AM. If blood sugar is >220 take 1/2 of usual correction dose of Humalog insulin. If blood sugar is 70 or below, treat with 1/2 cup of clear juice (apple or cranberry) and recheck blood sugar 15 minutes after drinking juice. Instructed her if blood sugar is over 70, to take 80% of her Lantus (again 4 units). She voiced understanding.   I called Dr. Allyne Gee' office and spoke with [name], informing her of pt's A1C of 13.1. She states she will give the info to Dr. Allyne Gee.

## 2018-10-18 NOTE — Anesthesia Preprocedure Evaluation (Addendum)
Anesthesia Evaluation  Patient identified by MRN, date of birth, ID band Patient awake    Reviewed: Allergy & Precautions, NPO status , Patient's Chart, lab work & pertinent test results  History of Anesthesia Complications Negative for: history of anesthetic complications  Airway Mallampati: II  TM Distance: >3 FB Neck ROM: Full    Dental no notable dental hx. (+) Dental Advisory Given   Pulmonary neg pulmonary ROS,    Pulmonary exam normal        Cardiovascular negative cardio ROS Normal cardiovascular exam  CV: Echo 04/27/18 (in setting of DKA/shock): Study Conclusions - Left ventricle: The cavity size was normal. Wall thickness was increased in a pattern of mild LVH. Systolic function was normal. The estimated ejection fraction was in the range of 55% to 60%. Wall motion was normal; there were no regional wall motion abnormalities. Left ventricular diastolic function parameters were normal. - Aortic valve: Valve area (VTI): 2.89 cm^2. Valve area (Vmax): 2.52 cm^2. Valve area (Vmean): 2.37 cm^2. - Mitral valve: There was mild regurgitation. - Pericardium, extracardiac: Small pericardial effusion adjacent to RA. Overall findings not consistent with tamponade phsyiology.    Neuro/Psych  Headaches, PSYCHIATRIC DISORDERS Anxiety Depression    GI/Hepatic negative GI ROS, Neg liver ROS,   Endo/Other  diabetes  Renal/GU negative Renal ROS     Musculoskeletal negative musculoskeletal ROS (+)   Abdominal   Peds  Hematology negative hematology ROS (+)   Anesthesia Other Findings Day of surgery medications reviewed with the patient.  Reproductive/Obstetrics                                                           Anesthesia Evaluation  Patient identified by MRN, date of birth, ID band Patient awake    Reviewed: Allergy & Precautions, NPO status , Patient's Chart, lab work &  pertinent test results  Airway Mallampati: II  TM Distance: >3 FB Neck ROM: Full    Dental no notable dental hx. 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  Neuromuscular disease negative neurological ROS     GI/Hepatic negative GI ROS, Neg liver ROS,   Endo/Other  diabetes, Poorly Controlled, Type 1, Insulin DependentLast episode of DKA 04/2018  Renal/GU negative Renal ROS     Musculoskeletal  (+) Arthritis , Osteoarthritis,  Charcot's joint right ankle   Abdominal   Peds  Hematology  (+) anemia ,   Anesthesia Other Findings   Reproductive/Obstetrics                            Anesthesia Physical Anesthesia Plan  ASA: III  Anesthesia Plan: General   Post-op Pain Management:    Induction: Intravenous  PONV Risk Score and Plan: 4 or greater and Ondansetron and Treatment may vary due to age or medical condition  Airway Management Planned: Oral ETT  Additional Equipment:   Intra-op Plan:   Post-operative Plan: Extubation in OR  Informed Consent: I have reviewed the patients History and Physical,  chart, labs and discussed the procedure including the risks, benefits and alternatives for the proposed anesthesia with the patient or authorized representative who has indicated his/her understanding and acceptance.   Dental advisory given  Plan Discussed with: CRNA and Surgeon  Anesthesia Plan Comments: (PAT note written 09/06/2018 by Shonna ChockAllison Zelenak, PA-C. She is a same day work-up. History of poorly controlled DM1. )      Anesthesia Quick Evaluation  Anesthesia Physical Anesthesia Plan  ASA:  III  Anesthesia Plan: General   Post-op Pain Management:    Induction: Intravenous  PONV Risk Score and Plan: 4 or greater and Ondansetron, Dexamethasone, Scopolamine patch - Pre-op and Diphenhydramine  Airway Management Planned: Oral ETT  Additional Equipment:   Intra-op Plan:   Post-operative Plan: Extubation in OR  Informed Consent:   Plan Discussed with:   Anesthesia Plan Comments: (PAT note written 10/18/2018 by Shonna ChockAllison Zelenak, PA-C. Uncontrolled DM type 1. A1c 13.1 09/23/18. )       Anesthesia Quick Evaluation

## 2018-10-21 ENCOUNTER — Ambulatory Visit (HOSPITAL_COMMUNITY): Payer: Self-pay | Admitting: Vascular Surgery

## 2018-10-21 ENCOUNTER — Encounter (HOSPITAL_COMMUNITY): Payer: Self-pay | Admitting: *Deleted

## 2018-10-21 ENCOUNTER — Other Ambulatory Visit: Payer: Self-pay

## 2018-10-21 ENCOUNTER — Encounter (HOSPITAL_COMMUNITY)
Admission: RE | Disposition: A | Discharge status: Home / Self Care | Payer: Self-pay | Source: Home / Self Care | Attending: Ophthalmology

## 2018-10-21 ENCOUNTER — Ambulatory Visit (HOSPITAL_COMMUNITY)
Admission: RE | Admit: 2018-10-21 | Discharge: 2018-10-21 | Disposition: A | Discharge status: Home / Self Care | Payer: Self-pay | Attending: Ophthalmology | Admitting: Ophthalmology

## 2018-10-21 DIAGNOSIS — Z794 Long term (current) use of insulin: Secondary | ICD-10-CM | POA: Insufficient documentation

## 2018-10-21 DIAGNOSIS — Z79899 Other long term (current) drug therapy: Secondary | ICD-10-CM | POA: Insufficient documentation

## 2018-10-21 DIAGNOSIS — D509 Iron deficiency anemia, unspecified: Secondary | ICD-10-CM | POA: Insufficient documentation

## 2018-10-21 DIAGNOSIS — F419 Anxiety disorder, unspecified: Secondary | ICD-10-CM | POA: Insufficient documentation

## 2018-10-21 DIAGNOSIS — E10610 Type 1 diabetes mellitus with diabetic neuropathic arthropathy: Secondary | ICD-10-CM | POA: Insufficient documentation

## 2018-10-21 DIAGNOSIS — E103542 Type 1 diabetes mellitus with proliferative diabetic retinopathy with combined traction retinal detachment and rhegmatogenous retinal detachment, left eye: Secondary | ICD-10-CM

## 2018-10-21 DIAGNOSIS — E103522 Type 1 diabetes mellitus with proliferative diabetic retinopathy with traction retinal detachment involving the macula, left eye: Secondary | ICD-10-CM | POA: Insufficient documentation

## 2018-10-21 HISTORY — PX: INJECTION OF SILICONE OIL: SHX6422

## 2018-10-21 HISTORY — PX: VITRECTOMY 25 GAUGE WITH SCLERAL BUCKLE: SHX6183

## 2018-10-21 LAB — POCT PREGNANCY, URINE: PREG TEST UR: NEGATIVE

## 2018-10-21 LAB — CBC
HCT: 32.9 % — ABNORMAL LOW (ref 36.0–46.0)
Hemoglobin: 9.7 g/dL — ABNORMAL LOW (ref 12.0–15.0)
MCH: 26.8 pg (ref 26.0–34.0)
MCHC: 29.5 g/dL — ABNORMAL LOW (ref 30.0–36.0)
MCV: 90.9 fL (ref 80.0–100.0)
Platelets: 433 10*3/uL — ABNORMAL HIGH (ref 150–400)
RBC: 3.62 MIL/uL — ABNORMAL LOW (ref 3.87–5.11)
RDW: 15.1 % (ref 11.5–15.5)
WBC: 9.8 10*3/uL (ref 4.0–10.5)
nRBC: 0 % (ref 0.0–0.2)

## 2018-10-21 LAB — BASIC METABOLIC PANEL
Anion gap: 11 (ref 5–15)
BUN: 12 mg/dL (ref 6–20)
CO2: 22 mmol/L (ref 22–32)
Calcium: 7.9 mg/dL — ABNORMAL LOW (ref 8.9–10.3)
Chloride: 103 mmol/L (ref 98–111)
Creatinine, Ser: 0.65 mg/dL (ref 0.44–1.00)
GFR calc Af Amer: >60 mL/min (ref >60)
GFR calc non Af Amer: >60 mL/min (ref >60)
Glucose, Bld: 181 mg/dL — ABNORMAL HIGH (ref 70–99)
Potassium: 3.4 mmol/L — ABNORMAL LOW (ref 3.5–5.1)
Sodium: 136 mmol/L (ref 135–145)

## 2018-10-21 LAB — GLUCOSE, CAPILLARY
GLUCOSE-CAPILLARY: 281 mg/dL — AB (ref 70–99)
Glucose-Capillary: 167 mg/dL — ABNORMAL HIGH (ref 70–99)
Glucose-Capillary: 287 mg/dL — ABNORMAL HIGH (ref 70–99)
Glucose-Capillary: 179 mg/dL — ABNORMAL HIGH (ref 70–99)

## 2018-10-21 SURGERY — VITRECTOMY, USING 25-GAUGE INSTRUMENTS, WITH SCLERAL BUCKLING
Anesthesia: General | Site: Eye | Laterality: Left

## 2018-10-21 MED ORDER — PHENYLEPHRINE 40 MCG/ML (10ML) SYRINGE FOR IV PUSH (FOR BLOOD PRESSURE SUPPORT)
PREFILLED_SYRINGE | INTRAVENOUS | Status: DC | PRN
  Administered 2018-10-21 (×4): 80 ug via INTRAVENOUS

## 2018-10-21 MED ORDER — LACTATED RINGERS IV SOLN
INTRAVENOUS | Status: DC | PRN
  Administered 2018-10-21: 08:00:00 via INTRAVENOUS

## 2018-10-21 MED ORDER — DEXAMETHASONE SODIUM PHOSPHATE 10 MG/ML IJ SOLN
INTRAMUSCULAR | Status: DC | PRN
  Administered 2018-10-21: 10 mg via INTRAVENOUS

## 2018-10-21 MED ORDER — PROPOFOL 10 MG/ML IV BOLUS
INTRAVENOUS | Status: AC
  Filled 2018-10-21: qty 20

## 2018-10-21 MED ORDER — MIDAZOLAM HCL 5 MG/5ML IJ SOLN
INTRAMUSCULAR | Status: DC | PRN
  Administered 2018-10-21: 2 mg via INTRAVENOUS

## 2018-10-21 MED ORDER — EPINEPHRINE PF 1 MG/ML IJ SOLN
INTRAMUSCULAR | Status: DC | PRN
  Administered 2018-10-21: .3 mL

## 2018-10-21 MED ORDER — CEFAZOLIN SUBCONJUNCTIVAL INJECTION 100 MG/0.5 ML
100.0000 mg | INJECTION | SUBCONJUNCTIVAL | Status: AC
  Administered 2018-10-21: 100 mg via SUBCONJUNCTIVAL
  Filled 2018-10-21: qty 1

## 2018-10-21 MED ORDER — LIDOCAINE HCL 2 % IJ SOLN
INTRAMUSCULAR | Status: AC
  Filled 2018-10-21: qty 20

## 2018-10-21 MED ORDER — SODIUM HYALURONATE 10 MG/ML IO SOLN
INTRAOCULAR | Status: AC
  Filled 2018-10-21: qty 0.85

## 2018-10-21 MED ORDER — ESMOLOL HCL 100 MG/10ML IV SOLN
INTRAVENOUS | Status: DC | PRN
  Administered 2018-10-21: 30 mg via INTRAVENOUS

## 2018-10-21 MED ORDER — EPHEDRINE SULFATE-NACL 50-0.9 MG/10ML-% IV SOSY
PREFILLED_SYRINGE | INTRAVENOUS | Status: DC | PRN
  Administered 2018-10-21 (×2): 5 mg via INTRAVENOUS

## 2018-10-21 MED ORDER — LIDOCAINE 2% (20 MG/ML) 5 ML SYRINGE
INTRAMUSCULAR | Status: DC | PRN
  Administered 2018-10-21: 100 mg via INTRAVENOUS
  Administered 2018-10-21 (×2): 50 mg via INTRAVENOUS

## 2018-10-21 MED ORDER — ATROPINE SULFATE 1 % OP SOLN
1.0000 [drp] | OPHTHALMIC | Status: DC | PRN
  Administered 2018-10-21 (×2): 1 [drp] via OPHTHALMIC
  Filled 2018-10-21: qty 2

## 2018-10-21 MED ORDER — DEXAMETHASONE SODIUM PHOSPHATE 10 MG/ML IJ SOLN
INTRAMUSCULAR | Status: AC
  Filled 2018-10-21: qty 1

## 2018-10-21 MED ORDER — STERILE WATER FOR INJECTION IJ SOLN
INTRAMUSCULAR | Status: AC
  Filled 2018-10-21: qty 10

## 2018-10-21 MED ORDER — STERILE WATER FOR INJECTION IJ SOLN
INTRAMUSCULAR | Status: AC
  Filled 2018-10-21: qty 20

## 2018-10-21 MED ORDER — FENTANYL CITRATE (PF) 250 MCG/5ML IJ SOLN
INTRAMUSCULAR | Status: AC
  Filled 2018-10-21: qty 5

## 2018-10-21 MED ORDER — INSULIN ASPART 100 UNIT/ML ~~LOC~~ SOLN
5.0000 [IU] | Freq: Once | SUBCUTANEOUS | Status: AC
  Administered 2018-10-21: 5 [IU] via SUBCUTANEOUS

## 2018-10-21 MED ORDER — LIDOCAINE 2% (20 MG/ML) 5 ML SYRINGE
INTRAMUSCULAR | Status: AC
  Filled 2018-10-21: qty 5

## 2018-10-21 MED ORDER — HYALURONIDASE HUMAN 150 UNIT/ML IJ SOLN
INTRAMUSCULAR | Status: AC
  Filled 2018-10-21: qty 1

## 2018-10-21 MED ORDER — MIDAZOLAM HCL 2 MG/2ML IJ SOLN
INTRAMUSCULAR | Status: AC
  Filled 2018-10-21: qty 2

## 2018-10-21 MED ORDER — BUPIVACAINE HCL (PF) 0.75 % IJ SOLN
INTRAMUSCULAR | Status: DC | PRN
  Administered 2018-10-21: 4 mL

## 2018-10-21 MED ORDER — ROCURONIUM BROMIDE 50 MG/5ML IV SOSY
PREFILLED_SYRINGE | INTRAVENOUS | Status: AC
  Filled 2018-10-21: qty 5

## 2018-10-21 MED ORDER — ONDANSETRON HCL 4 MG/2ML IJ SOLN
INTRAMUSCULAR | Status: DC | PRN
  Administered 2018-10-21: 4 mg via INTRAVENOUS

## 2018-10-21 MED ORDER — BUPIVACAINE HCL (PF) 0.75 % IJ SOLN
INTRAMUSCULAR | Status: AC
  Filled 2018-10-21: qty 10

## 2018-10-21 MED ORDER — HYALURONIDASE HUMAN 150 UNIT/ML IJ SOLN
INTRAMUSCULAR | Status: DC | PRN
  Administered 2018-10-21: 150 [IU] via SUBCUTANEOUS

## 2018-10-21 MED ORDER — PROVISC 10 MG/ML IO SOLN
INTRAOCULAR | Status: DC | PRN
  Administered 2018-10-21: .85 mL via INTRAOCULAR

## 2018-10-21 MED ORDER — ESMOLOL HCL 100 MG/10ML IV SOLN
INTRAVENOUS | Status: AC
  Filled 2018-10-21: qty 10

## 2018-10-21 MED ORDER — BSS PLUS IO SOLN
INTRAOCULAR | Status: DC | PRN
  Administered 2018-10-21: 1 via INTRAOCULAR

## 2018-10-21 MED ORDER — BSS PLUS IO SOLN
INTRAOCULAR | Status: AC
  Filled 2018-10-21: qty 500

## 2018-10-21 MED ORDER — BSS IO SOLN
INTRAOCULAR | Status: DC | PRN
  Administered 2018-10-21: 500 mL via INTRAOCULAR

## 2018-10-21 MED ORDER — METOCLOPRAMIDE HCL 5 MG/ML IJ SOLN
INTRAMUSCULAR | Status: AC
  Filled 2018-10-21: qty 2

## 2018-10-21 MED ORDER — FENTANYL CITRATE (PF) 100 MCG/2ML IJ SOLN
INTRAMUSCULAR | Status: DC | PRN
  Administered 2018-10-21: 100 ug via INTRAVENOUS

## 2018-10-21 MED ORDER — TRIAMCINOLONE ACETONIDE 40 MG/ML IJ SUSP
INTRAMUSCULAR | Status: AC
  Filled 2018-10-21: qty 5

## 2018-10-21 MED ORDER — POLYMYXIN B SULFATE 500000 UNITS IJ SOLR
INTRAMUSCULAR | Status: AC
  Filled 2018-10-21: qty 500000

## 2018-10-21 MED ORDER — SCOPOLAMINE 1 MG/3DAYS TD PT72
1.0000 | MEDICATED_PATCH | TRANSDERMAL | Status: DC
  Administered 2018-10-21: 1.5 mg via TRANSDERMAL

## 2018-10-21 MED ORDER — DEXAMETHASONE SODIUM PHOSPHATE 10 MG/ML IJ SOLN
INTRAMUSCULAR | Status: DC | PRN
  Administered 2018-10-21: 5 mg via INTRAVENOUS

## 2018-10-21 MED ORDER — CEFAZOLIN SODIUM 1 G IJ SOLR
INTRAMUSCULAR | Status: DC | PRN
  Administered 2018-10-21: 1 g via INTRAMUSCULAR

## 2018-10-21 MED ORDER — INDOCYANINE GREEN 25 MG IV SOLR
INTRAVENOUS | Status: AC
  Filled 2018-10-21: qty 25

## 2018-10-21 MED ORDER — ONDANSETRON HCL 4 MG/2ML IJ SOLN
INTRAMUSCULAR | Status: AC
  Filled 2018-10-21: qty 2

## 2018-10-21 MED ORDER — ACETAMINOPHEN 500 MG PO TABS
1000.0000 mg | ORAL_TABLET | Freq: Once | ORAL | Status: AC
  Administered 2018-10-21: 1000 mg via ORAL
  Filled 2018-10-21: qty 2

## 2018-10-21 MED ORDER — ATROPINE SULFATE 1 % OP SOLN
OPHTHALMIC | Status: AC
  Filled 2018-10-21: qty 5

## 2018-10-21 MED ORDER — ROCURONIUM BROMIDE 10 MG/ML (PF) SYRINGE
PREFILLED_SYRINGE | INTRAVENOUS | Status: DC | PRN
  Administered 2018-10-21 (×2): 10 mg via INTRAVENOUS
  Administered 2018-10-21: 50 mg via INTRAVENOUS

## 2018-10-21 MED ORDER — INSULIN ASPART 100 UNIT/ML ~~LOC~~ SOLN
SUBCUTANEOUS | Status: AC
  Filled 2018-10-21: qty 1

## 2018-10-21 MED ORDER — PROPOFOL 10 MG/ML IV BOLUS
INTRAVENOUS | Status: DC | PRN
  Administered 2018-10-21: 50 mg via INTRAVENOUS
  Administered 2018-10-21: 150 mg via INTRAVENOUS

## 2018-10-21 MED ORDER — ACETAZOLAMIDE SODIUM 500 MG IJ SOLR
INTRAMUSCULAR | Status: AC
  Filled 2018-10-21: qty 500

## 2018-10-21 MED ORDER — SCOPOLAMINE 1 MG/3DAYS TD PT72
MEDICATED_PATCH | TRANSDERMAL | Status: AC
  Administered 2018-10-21: 1.5 mg via TRANSDERMAL
  Filled 2018-10-21: qty 1

## 2018-10-21 MED ORDER — CEFTAZIDIME 1 G IJ SOLR
INTRAMUSCULAR | Status: AC
  Filled 2018-10-21: qty 1

## 2018-10-21 MED ORDER — PROMETHAZINE HCL 25 MG/ML IJ SOLN: 6.2500 mg | INTRAMUSCULAR | Status: DC | PRN

## 2018-10-21 MED ORDER — PHENYLEPHRINE HCL 2.5 % OP SOLN
1.0000 [drp] | OPHTHALMIC | Status: DC | PRN
  Administered 2018-10-21 (×2): 1 [drp] via OPHTHALMIC
  Filled 2018-10-21: qty 2

## 2018-10-21 MED ORDER — SODIUM CHLORIDE 0.9 % IV SOLN
INTRAVENOUS | Status: DC | PRN
  Administered 2018-10-21: 30 ug/min via INTRAVENOUS

## 2018-10-21 MED ORDER — EPINEPHRINE PF 1 MG/ML IJ SOLN
INTRAMUSCULAR | Status: AC
  Filled 2018-10-21: qty 1

## 2018-10-21 MED ORDER — SUGAMMADEX SODIUM 200 MG/2ML IV SOLN
INTRAVENOUS | Status: DC | PRN
  Administered 2018-10-21: 127.6 mg via INTRAVENOUS

## 2018-10-21 MED ORDER — FENTANYL CITRATE (PF) 100 MCG/2ML IJ SOLN: 25.0000 ug | INTRAMUSCULAR | Status: DC | PRN

## 2018-10-21 MED ORDER — BSS IO SOLN
INTRAOCULAR | Status: AC
  Filled 2018-10-21: qty 15

## 2018-10-21 MED ORDER — TOBRAMYCIN-DEXAMETHASONE 0.3-0.1 % OP OINT
TOPICAL_OINTMENT | OPHTHALMIC | Status: AC
  Filled 2018-10-21: qty 3.5

## 2018-10-21 MED ORDER — SODIUM CHLORIDE (PF) 0.9 % IJ SOLN
INTRAMUSCULAR | Status: AC
  Filled 2018-10-21: qty 10

## 2018-10-21 MED ORDER — INSULIN ASPART 100 UNIT/ML ~~LOC~~ SOLN
SUBCUTANEOUS | Status: AC
  Administered 2018-10-21: 5 [IU] via SUBCUTANEOUS
  Filled 2018-10-21: qty 1

## 2018-10-21 MED ORDER — STERILE WATER FOR INJECTION IJ SOLN
INTRAMUSCULAR | Status: DC | PRN
  Administered 2018-10-21: 10 mL

## 2018-10-21 SURGICAL SUPPLY — 63 items
APPLICATOR COTTON TIP 6IN STRL (MISCELLANEOUS) ×2 IMPLANT
APPLICATOR DR MATTHEWS STRL (MISCELLANEOUS) ×2 IMPLANT
BLADE MINI 60D BLUE (BLADE) ×2 IMPLANT
BLADE MINI RND TIP GREEN BEAV (BLADE) IMPLANT
BNDG EYE OVAL (GAUZE/BANDAGES/DRESSINGS) ×1 IMPLANT
CANNULA ANT CHAM MAIN (OPHTHALMIC RELATED) IMPLANT
CANNULA DUAL BORE 23G (CANNULA) IMPLANT
CANNULA VLV SOFT TIP 25G (OPHTHALMIC) IMPLANT
CANNULA VLV SOFT TIP 25GA (OPHTHALMIC) ×2 IMPLANT
CAUTERY EYE LOW TEMP 1300F FIN (OPHTHALMIC RELATED) ×2 IMPLANT
CORDS BIPOLAR (ELECTRODE) IMPLANT
COVER MAYO STAND STRL (DRAPES) ×2 IMPLANT
COVER SURGICAL LIGHT HANDLE (MISCELLANEOUS) ×2 IMPLANT
COVER WAND RF STERILE (DRAPES) ×2 IMPLANT
DRAPE HALF SHEET 40X57 (DRAPES) ×2 IMPLANT
FILTER BLUE MILLIPORE (MISCELLANEOUS) IMPLANT
FILTER STRAW FLUID ASPIR (MISCELLANEOUS) IMPLANT
FORCEPS GRIESHABER ILM 25G A (INSTRUMENTS) ×1 IMPLANT
GAS AUTO FILL CONSTEL (OPHTHALMIC) ×2
GAS AUTO FILL CONSTELLATION (OPHTHALMIC) ×1 IMPLANT
GAS OPHTHALMIC (MISCELLANEOUS) IMPLANT
GLOVE BIO SURGEON STRL SZ7.5 (GLOVE) ×2 IMPLANT
GOWN STRL REUS W/ TWL LRG LVL3 (GOWN DISPOSABLE) ×2 IMPLANT
GOWN STRL REUS W/TWL LRG LVL3 (GOWN DISPOSABLE) ×2
HANDLE PNEUMATIC FOR CONSTEL (OPHTHALMIC) ×1 IMPLANT
KIT BASIN OR (CUSTOM PROCEDURE TRAY) ×2 IMPLANT
KIT PERFLUORON PROCEDURE 5ML (MISCELLANEOUS) IMPLANT
KIT TURNOVER KIT B (KITS) ×2 IMPLANT
LENS BIOM SUPER VIEW SET DISP (OPHTHALMIC RELATED) ×2 IMPLANT
NDL 18GX1X1/2 (RX/OR ONLY) (NEEDLE) ×1 IMPLANT
NDL HYPO 25GX1X1/2 BEV (NEEDLE) IMPLANT
NDL HYPO 30X.5 LL (NEEDLE) ×2 IMPLANT
NEEDLE 18GX1X1/2 (RX/OR ONLY) (NEEDLE) ×2 IMPLANT
NEEDLE HYPO 25GX1X1/2 BEV (NEEDLE) IMPLANT
NEEDLE HYPO 30X.5 LL (NEEDLE) ×4 IMPLANT
NS IRRIG 1000ML POUR BTL (IV SOLUTION) ×2 IMPLANT
OIL SILICONE OPHTHALMIC 1000 (Ophthalmic Related) ×1 IMPLANT
PACK VITRECTOMY CUSTOM (CUSTOM PROCEDURE TRAY) ×2 IMPLANT
PAD ARMBOARD 7.5X6 YLW CONV (MISCELLANEOUS) ×4 IMPLANT
PAK PIK VITRECTOMY CVS 25GA (OPHTHALMIC) IMPLANT
PROBE ENDO DIATHERMY 25G (MISCELLANEOUS) ×2 IMPLANT
PROBE LASER ILLUM FLEX CVD 23G (OPHTHALMIC) IMPLANT
PROBE LASER ILLUM FLEX CVD 25G (OPHTHALMIC) ×2 IMPLANT
REPL STRA BRUSH NDL (NEEDLE) IMPLANT
REPL STRA BRUSH NEEDLE (NEEDLE) IMPLANT
RESERVOIR BACK FLUSH (MISCELLANEOUS) IMPLANT
RETRACTOR IRIS FLEX 25G GRIESH (INSTRUMENTS) IMPLANT
ROLLS DENTAL (MISCELLANEOUS) IMPLANT
SET FLUID INJECTOR (SET/KITS/TRAYS/PACK) IMPLANT
SHIELD EYE LENSE ONLY DISP (GAUZE/BANDAGES/DRESSINGS) ×1 IMPLANT
STOCKINETTE IMPERVIOUS 9X36 MD (GAUZE/BANDAGES/DRESSINGS) ×4 IMPLANT
STOPCOCK 4 WAY LG BORE MALE ST (IV SETS) IMPLANT
SUT ETHILON 5.0 S-24 (SUTURE) ×2 IMPLANT
SUT SILK 2 0 (SUTURE) ×1
SUT SILK 2-0 18XBRD TIE 12 (SUTURE) ×1 IMPLANT
SUT VICRYL 7 0 TG140 8 (SUTURE) IMPLANT
SYR 20CC LL (SYRINGE) IMPLANT
SYRINGE 1CC SLIP TB (MISCELLANEOUS) ×1 IMPLANT
TAPE SURG TRANSPORE 1 IN (GAUZE/BANDAGES/DRESSINGS) IMPLANT
TAPE SURGICAL TRANSPORE 1 IN (GAUZE/BANDAGES/DRESSINGS) ×1
TOWEL OR 17X24 6PK STRL BLUE (TOWEL DISPOSABLE) ×4 IMPLANT
WATER STERILE IRR 1000ML POUR (IV SOLUTION) ×2 IMPLANT
WIPE INSTRUMENT VISIWIPE 73X73 (MISCELLANEOUS) IMPLANT

## 2018-10-21 NOTE — Progress Notes (Signed)
Dr. Krista Blue made aware pt. Had 10z of [facility] this morning at 0500 due to low CBG.
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
## 2018-10-21 NOTE — Anesthesia Procedure Notes (Signed)
Procedure Name: Intubation Performed by: Milford Cage, CRNA Pre-anesthesia Checklist: Patient identified, Emergency Drugs available, Suction available and Patient being monitored Patient Re-evaluated:Patient Re-evaluated prior to induction Oxygen Delivery Method: Circle System Utilized Preoxygenation: Pre-oxygenation with 100% oxygen Induction Type: IV induction Ventilation: Mask ventilation without difficulty Laryngoscope Size: Mac and 3 Grade View: Grade II Tube type: Oral Tube size: 6.5 mm Number of attempts: 1 Airway Equipment and Method: Stylet and Oral airway Placement Confirmation: ETT inserted through vocal cords under direct vision,  positive ETCO2 and breath sounds checked- equal and bilateral Secured at: 22 cm Tube secured with: Tape Dental Injury: Teeth and Oropharynx as per pre-operative assessment

## 2018-10-21 NOTE — Anesthesia Postprocedure Evaluation (Signed)
Anesthesia Post Note  Patient: [name]  Procedure(s) Performed: VITRECTOMY 25 GAUGE, ENDOLASER, ENDOCAUTERY, MEMBRANE PEEL LEFT EYE (Left Eye) INJECTION OF SILICONE OIL (Left Eye)     Patient location during evaluation: PACU Anesthesia Type: General Level of consciousness: sedated Pain management: pain level controlled Vital Signs Assessment: post-procedure vital signs reviewed and stable Respiratory status: spontaneous breathing and respiratory function stable Cardiovascular status: stable Postop Assessment: no apparent nausea or vomiting Anesthetic complications: no    Last Vitals:  Vitals:   10/21/18 1125 10/21/18 1145  BP: 121/74 117/74  Pulse: 100 95  Resp: 15 13  Temp:    SpO2: 96% 96%    Last Pain:  Vitals:   10/21/18 1145  TempSrc:   PainSc: Asleep                 Redell Bhandari DANIEL

## 2018-10-21 NOTE — Transfer of Care (Signed)
Immediate Anesthesia Transfer of Care Note  Patient: [name]  Procedure(s) Performed: VITRECTOMY 25 GAUGE, ENDOLASER, ENDOCAUTERY, MEMBRANE PEEL LEFT EYE (Left Eye) INJECTION OF SILICONE OIL (Left Eye)  Patient Location: PACU  Anesthesia Type:General  Level of Consciousness: awake  Airway & Oxygen Therapy: Patient Spontanous Breathing and Patient connected to face mask oxygen  Post-op Assessment: Report given to RN and Post -op Vital signs reviewed and stable  Post vital signs: Reviewed and stable  Last Vitals:  Vitals Value Taken Time  BP 157/101 10/21/2018 10:28 AM  Temp 98.0   Pulse 93 10/21/2018 10:29 AM  Resp 15 10/21/2018 10:29 AM  SpO2 99 % 10/21/2018 10:29 AM  Vitals shown include unvalidated device data.  Last Pain:  Vitals:   10/21/18 4158  TempSrc: Oral  PainSc:          Complications: No apparent anesthesia complications

## 2018-10-21 NOTE — Op Note (Signed)
NAMHughie Closs: SOYARS-MARTIN, Iyesha MEDICAL RECORD [id number] ACCOUNT [id number] DATE OF BIRTH:1981/02/06 FACILITY: [facility] LOCATION: [facility] PHYSICIAN:Bristyn Kulesza B. Maxima Skelton, MD  OPERATIVE REPORT  DATE OF PROCEDURE:  10/21/2018  PREOPERATIVE DIAGNOSIS:  Tractional retinal detachment and proliferative diabetic retinopathy with vitreous hemorrhage, left eye.  POSTOPERATIVE DIAGNOSIS:  Tractional retinal detachment and proliferative diabetic retinopathy with vitreous hemorrhage, left eye.  ANESTHESIA:  General with endotracheal intubation.  OPERATION PROCEDURE:  Pars plana vitrectomy with repair of complex tractional retinal detachment, membrane peeling endocautery endolaser, air fluid exchange and silicone oil, using 1000 centistoke silicone oil, all left eye.  COMPLICATIONS:  None.  FINDINGS:  There was a complex tractional retinal detachment of the left eye involving the macula with advanced proliferative diabetic retinopathy.  DESCRIPTION OF PROCEDURE:  The patient was identified in the preoperative holding area.  She was then taken to the operating room where she was placed under general anesthesia by the anesthesia team.  Once she was safely intubated and stable the left eye  was then anesthetized using a retrobulbar block for postoperative pain control.  Retrobulbar block was placed using a retrobulbar needle, which was used to deliver 8 mL of bupivacaine in the left retroconal tunnel space.  The needle was removed.  After  this was performed, the left eye was prepped and draped in the usual sterile fashion for ocular surgery.  A Lieberman speculum was placed between the left upper and lower eyelids for exposure.  The 25 gauge trocars were used to introduce 25 gauge  cannulas in the inferior temporal, superior temporal and superonasal quadrants.  An infusion cannula was connected to the inferior temporal cannula and confirmed to be in the vitreous cavity prior to its use.  The eye then underwent  a core vitrectomy  with the vitreous cutter and light pipe.  Anterior, posterior traction of the macula through the vitreous was released by trimming the vitreous.  The vitreous was then dissected out to the periphery.  Most of the traction involved the posterior pole and  the macula along the superior temporal arcade and the optic nerve.  There was not any peripheral traction fortunately.  Once the AP traction was released a contact lens was placed on the cornea for magnification.  ILM forceps were used to gently begin to  peel and dissect dense fibrous tissue off the optic nerve head.  A peel and cut technique was used peeling tissue off and then using the vitrector to remove.  This was a very nontraumatic technique and successfully allowed for complete elimination of  all preretinal fibrosis.  A small retinal hole was identified that was contributing to the complex tractional retinal detachment.  This hole was marked using endocautery.  Once this was completed, an endolaser probe was used to perform panretinal  photocoagulation.  The eye was inspected with scleral depression.  The peripheral retinal break was also seen at 10 o'clock  in the periphery.  This appeared old.  This was lasered with endolaser.  There was no retinal detachment in this region.  Next, a  soft-tipped extrusion cannula was used to extrude subretinal fluid as an air fluid exchange was performed.  The subretinal fluid was extremely viscous, so much so that the fluid was having difficulty being removed using the 25 gauge soft tip extrusion  cannula.  Therefore, the vitrector was used to allow for removal of the viscous subretinal fluid.  Once this was performed, a soft tip was used to remove the nonviscous fluid. After complete removal of  the fluid from the eye, the retinal break was  treated with endolaser photocoagulation, which appeared to seal things down very nicely.  Next, the eye was filled with 1000 centistoke silicone oil.   The oil was brought up to the lens iris diaphragm.  Next, the cannula was removed from the trocars.   Cannula was removed from the sclerotomies.  The sclerotomies were inspected and found to be sealed.  The eye was treated with subconjunctival injections of 50 mg Ancef 1 mg and dexamethasone.  The eye was treated topically with 1 drop of 1% atropine and  TobraDex ointment.  The speculum was removed.  Eyelids were cleaned and closed, then patch and shield.    The patient was then taken to recovery in excellent condition, having tolerated the procedure well.  AN/NUANCE  D:10/21/2018 T:10/21/2018 JOB:[id number]

## 2018-10-21 NOTE — Brief Op Note (Signed)
10/21/2018  10:24 AM  PATIENT:  [name]  38 y.o. female  PRE-OPERATIVE DIAGNOSIS:  proliferative diabetic retinopathy,traction retinal detachment, vitreous hemmorhage left eye  POST-OPERATIVE DIAGNOSIS:  proliferative diabetic retinopathy,traction retinal detachment, vitreous hemmorhage left eye  PROCEDURE:  Procedure(s): VITRECTOMY 25 GAUGE, ENDOLASER, ENDOCAUTERY, MEMBRANE PEEL LEFT EYE (Left) INJECTION OF SILICONE OIL (Left)  SURGEON:  Surgeon(s) and Role:    Stephannie Li, MD - Primary  PHYSICIAN ASSISTANT:   ASSISTANTS: none   ANESTHESIA:   general  EBL: minimal  BLOOD ADMINISTERED:none  DRAINS: none   LOCAL MEDICATIONS USED:  BUPIVICAINE   SPECIMEN:  No Specimen  DISPOSITION OF SPECIMEN:  N/A  COUNTS:  YES  TOURNIQUET:  * No tourniquets in log *  DICTATION: .Other Dictation: Dictation Number [id number]  PLAN OF CARE: Discharge to home after PACU  PATIENT DISPOSITION:  PACU - hemodynamically stable.   Delay start of Pharmacological VTE agent (>24hrs) due to surgical blood loss or risk of bleeding: not applicable

## 2018-10-21 NOTE — Progress Notes (Signed)
cbg 287 spoke with dr singer re: same/ orders rec'd

## 2018-10-22 ENCOUNTER — Encounter (HOSPITAL_COMMUNITY): Payer: Self-pay | Admitting: Ophthalmology

## 2018-10-23 ENCOUNTER — Encounter: Payer: Self-pay | Admitting: Physician Assistant

## 2018-10-23 ENCOUNTER — Ambulatory Visit (INDEPENDENT_AMBULATORY_CARE_PROVIDER_SITE_OTHER): Payer: Self-pay | Admitting: Physician Assistant

## 2018-10-23 VITALS — BP 129/90 | HR 89 | Temp 97.4°F | Ht 63.0 in | Wt 140.0 lb

## 2018-10-23 DIAGNOSIS — K580 Irritable bowel syndrome with diarrhea: Secondary | ICD-10-CM

## 2018-10-23 DIAGNOSIS — Z23 Encounter for immunization: Secondary | ICD-10-CM

## 2018-10-23 DIAGNOSIS — K30 Functional dyspepsia: Secondary | ICD-10-CM

## 2018-10-23 MED ORDER — OMEPRAZOLE 20 MG PO CPDR: 20.0000 mg | DELAYED_RELEASE_CAPSULE | Freq: Every day | ORAL | 11 refills | Status: DC
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
## 2018-11-01 ENCOUNTER — Ambulatory Visit: Payer: Self-pay | Admitting: Physician Assistant

## 2018-11-01 ENCOUNTER — Other Ambulatory Visit: Payer: Self-pay | Admitting: Physician Assistant

## 2018-11-01 DIAGNOSIS — F339 Major depressive disorder, recurrent, unspecified: Secondary | ICD-10-CM

## 2018-12-10 ENCOUNTER — Emergency Department (HOSPITAL_COMMUNITY)
Admission: EM | Admit: 2018-12-10 | Discharge: 2018-12-10 | Disposition: A | Discharge status: Home / Self Care | Payer: Self-pay | Attending: Emergency Medicine | Admitting: Emergency Medicine

## 2018-12-10 ENCOUNTER — Emergency Department (HOSPITAL_COMMUNITY): Payer: Self-pay

## 2018-12-10 ENCOUNTER — Other Ambulatory Visit: Payer: Self-pay

## 2018-12-10 ENCOUNTER — Encounter (HOSPITAL_COMMUNITY): Payer: Self-pay | Admitting: Emergency Medicine

## 2018-12-10 DIAGNOSIS — Z79899 Other long term (current) drug therapy: Secondary | ICD-10-CM | POA: Insufficient documentation

## 2018-12-10 DIAGNOSIS — R110 Nausea: Secondary | ICD-10-CM | POA: Insufficient documentation

## 2018-12-10 DIAGNOSIS — N1330 Unspecified hydronephrosis: Secondary | ICD-10-CM | POA: Insufficient documentation

## 2018-12-10 DIAGNOSIS — E1040 Type 1 diabetes mellitus with diabetic neuropathy, unspecified: Secondary | ICD-10-CM | POA: Insufficient documentation

## 2018-12-10 DIAGNOSIS — N390 Urinary tract infection, site not specified: Secondary | ICD-10-CM | POA: Insufficient documentation

## 2018-12-10 DIAGNOSIS — Z794 Long term (current) use of insulin: Secondary | ICD-10-CM | POA: Insufficient documentation

## 2018-12-10 LAB — CBC WITH DIFFERENTIAL/PLATELET
Abs Immature Granulocytes: 0.1 10*3/uL — ABNORMAL HIGH (ref 0.00–0.07)
BASOS PCT: 0 %
Basophils Absolute: 0 10*3/uL (ref 0.0–0.1)
Eosinophils Absolute: 0.1 10*3/uL (ref 0.0–0.5)
Eosinophils Relative: 1 %
HCT: 38.6 % (ref 36.0–46.0)
Hemoglobin: 12.3 g/dL (ref 12.0–15.0)
Lymphocytes Relative: 11 %
Lymphs Abs: 0.8 10*3/uL (ref 0.7–4.0)
MCH: 27.1 pg (ref 26.0–34.0)
MCHC: 31.9 g/dL (ref 30.0–36.0)
MCV: 85 fL (ref 80.0–100.0)
MONOS PCT: 0 %
Metamyelocytes Relative: 1 %
Monocytes Absolute: 0 10*3/uL — ABNORMAL LOW (ref 0.1–1.0)
Myelocytes: 1 %
NEUTROS PCT: 86 %
Neutro Abs: 6.2 10*3/uL (ref 1.7–7.7)
Platelets: 335 10*3/uL (ref 150–400)
RBC: 4.54 MIL/uL (ref 3.87–5.11)
RDW: 14.2 % (ref 11.5–15.5)
WBC: 7.2 10*3/uL (ref 4.0–10.5)
nRBC: 0 % (ref 0.0–0.2)
nRBC: 0 /100 WBC

## 2018-12-10 LAB — URINALYSIS, ROUTINE W REFLEX MICROSCOPIC
Bilirubin Urine: NEGATIVE
Glucose, UA: >=500 mg/dL — AB
Ketones, ur: NEGATIVE mg/dL
Nitrite: POSITIVE — AB
Protein, ur: 100 mg/dL — AB
Specific Gravity, Urine: 1.026 (ref 1.005–1.030)
WBC, UA: >50 WBC/hpf — ABNORMAL HIGH (ref 0–5)
pH: 6 (ref 5.0–8.0)

## 2018-12-10 LAB — BASIC METABOLIC PANEL
Anion gap: 14 (ref 5–15)
BUN: 13 mg/dL (ref 6–20)
CO2: 23 mmol/L (ref 22–32)
Calcium: 9.1 mg/dL (ref 8.9–10.3)
Chloride: 93 mmol/L — ABNORMAL LOW (ref 98–111)
Creatinine, Ser: 1.03 mg/dL — ABNORMAL HIGH (ref 0.44–1.00)
GFR calc Af Amer: >60 mL/min (ref >60)
GFR calc non Af Amer: >60 mL/min (ref >60)
Glucose, Bld: 141 mg/dL — ABNORMAL HIGH (ref 70–99)
POTASSIUM: 3.2 mmol/L — AB (ref 3.5–5.1)
Sodium: 130 mmol/L — ABNORMAL LOW (ref 135–145)

## 2018-12-10 LAB — PREGNANCY, URINE: Preg Test, Ur: NEGATIVE

## 2018-12-10 MED ORDER — FENTANYL CITRATE (PF) 100 MCG/2ML IJ SOLN
50.0000 ug | Freq: Once | INTRAMUSCULAR | Status: AC
  Administered 2018-12-10: 50 ug via INTRAVENOUS
  Filled 2018-12-10: qty 2

## 2018-12-10 MED ORDER — ONDANSETRON HCL 4 MG/2ML IJ SOLN
4.0000 mg | Freq: Once | INTRAMUSCULAR | Status: AC
  Administered 2018-12-10: 4 mg via INTRAVENOUS
  Filled 2018-12-10: qty 2

## 2018-12-10 MED ORDER — KETOROLAC TROMETHAMINE 30 MG/ML IJ SOLN
30.0000 mg | Freq: Once | INTRAMUSCULAR | Status: AC
  Administered 2018-12-10: 30 mg via INTRAVENOUS
  Filled 2018-12-10: qty 1

## 2018-12-10 MED ORDER — SODIUM CHLORIDE 0.9 % IV SOLN
1.0000 g | Freq: Once | INTRAVENOUS | Status: AC
  Administered 2018-12-10: 1 g via INTRAVENOUS
  Filled 2018-12-10: qty 10

## 2018-12-10 MED ORDER — KETOROLAC TROMETHAMINE 10 MG PO TABS: 10.0000 mg | ORAL_TABLET | Freq: Four times a day (QID) | ORAL | 0 refills | Status: DC | PRN

## 2018-12-10 MED ORDER — SODIUM CHLORIDE 0.9 % IV BOLUS
500.0000 mL | Freq: Once | INTRAVENOUS | Status: AC
  Administered 2018-12-10: 500 mL via INTRAVENOUS

## 2018-12-10 MED ORDER — CEPHALEXIN 500 MG PO CAPS: 500.0000 mg | ORAL_CAPSULE | Freq: Four times a day (QID) | ORAL | 0 refills | Status: DC

## 2018-12-10 MED ORDER — SODIUM CHLORIDE 0.9 % IV SOLN
INTRAVENOUS | Status: DC | PRN
  Administered 2018-12-10: 10:00:00 via INTRAVENOUS

## 2018-12-10 NOTE — ED Notes (Signed)
Patient returned back from CT. 

## 2018-12-10 NOTE — ED Triage Notes (Signed)
Reports right flank pain which began last night.  C/o nausea without vomiting.  Denies diarrhea, fever, dysuria.  States she did have vaginal bleeding which began last night but believes this is her menstrual cycle.
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
## 2018-12-10 NOTE — Discharge Instructions (Addendum)
You have a urinary infection.  Increase fluids.  Prescription for antibiotic and pain medicine sent to your pharmacy.  Return if worse.

## 2018-12-13 LAB — URINE CULTURE: Culture: >=100000 — AB

## 2018-12-15 ENCOUNTER — Telehealth: Payer: Self-pay | Admitting: Emergency Medicine

## 2018-12-15 ENCOUNTER — Other Ambulatory Visit: Payer: Self-pay | Admitting: Physician Assistant

## 2018-12-15 MED ORDER — ESCITALOPRAM OXALATE 10 MG PO TABS: 10.0000 mg | ORAL_TABLET | Freq: Every day | ORAL | 5 refills | Status: DC

## 2018-12-15 NOTE — Telephone Encounter (Signed)
Post ED Visit - Positive Culture Follow-up  Culture report reviewed by antimicrobial stewardship pharmacist: [facility] []  Enzo Bi, Pharm.D. []  Celedonio Miyamoto, Pharm.D., BCPS AQ-ID []  Garvin Fila, Pharm.D., BCPS []  Georgina Pillion, Pharm.D., BCPS []  [place], [place].D., BCPS, AAHIVP []  Estella Husk, Pharm.D., BCPS, AAHIVP []  Lysle Pearl, PharmD, BCPS []  Phillips Climes, PharmD, BCPS []  Agapito Games, PharmD, BCPS [x]  Verlan Friends, PharmD []  Mervyn Gay, PharmD, BCPS []  Vinnie Level, PharmD  [facility] []  Len Childs, PharmD []  Greer Pickerel, PharmD []  Adalberto Cole, PharmD []  Perlie Gold, Rph []  Lonell Face) Jean Rosenthal, PharmD []  Earl Many, PharmD []  Junita Push, PharmD []  Dorna Leitz, PharmD []  Terrilee Files, PharmD []  Lynann Beaver, PharmD []  Keturah Barre, PharmD []  Loralee Pacas, PharmD []  Bernadene Person, PharmD   Positive urine culture Treated with cephalexin, organism sensitive to the same and no further patient follow-up is required at this time.  Norm Parcel 12/15/2018, 4:47 PM

## 2019-03-28 ENCOUNTER — Other Ambulatory Visit: Payer: Self-pay | Admitting: Physician Assistant

## 2019-03-28 MED ORDER — CIPROFLOXACIN HCL 500 MG PO TABS: 500.0000 mg | ORAL_TABLET | Freq: Two times a day (BID) | ORAL | 0 refills | Status: DC

## 2019-04-04 MED ORDER — SODIUM CHLORIDE 0.9 % IV SOLN
10.00 | INTRAVENOUS | Status: DC
Start: ? — End: 2019-04-04

## 2019-04-04 MED ORDER — ALPRAZOLAM 0.25 MG PO TABS
.25 | ORAL_TABLET | ORAL | Status: DC
Start: ? — End: 2019-04-04

## 2019-04-04 MED ORDER — ESCITALOPRAM OXALATE 10 MG PO TABS
20.00 | ORAL_TABLET | ORAL | Status: DC
Start: 2019-04-15 — End: 2019-04-04

## 2019-04-04 MED ORDER — INSULIN GLARGINE 100 UNIT/ML ~~LOC~~ SOLN
7.00 | SUBCUTANEOUS | Status: DC
Start: 2019-04-04 — End: 2019-04-04

## 2019-04-04 MED ORDER — GENERIC EXTERNAL MEDICATION
Status: DC
Start: ? — End: 2019-04-04

## 2019-04-04 MED ORDER — HEPARIN SODIUM (PORCINE) 5000 UNIT/ML IJ SOLN
5000.00 | INTRAMUSCULAR | Status: DC
Start: 2019-04-04 — End: 2019-04-04

## 2019-04-04 MED ORDER — FERROUS SULFATE 325 (65 FE) MG PO TABS
325.00 | ORAL_TABLET | ORAL | Status: DC
Start: 2019-04-04 — End: 2019-04-04

## 2019-04-04 MED ORDER — CLONIDINE HCL 0.1 MG PO TABS
.10 | ORAL_TABLET | ORAL | Status: DC
Start: ? — End: 2019-04-04

## 2019-04-04 MED ORDER — ARIPIPRAZOLE 2 MG PO TABS
2.00 | ORAL_TABLET | ORAL | Status: DC
Start: 2019-04-05 — End: 2019-04-04

## 2019-04-04 MED ORDER — POLYETHYLENE GLYCOL 3350 17 G PO PACK
17.00 | PACK | ORAL | Status: DC
Start: ? — End: 2019-04-04

## 2019-04-04 MED ORDER — NITROGLYCERIN 0.4 MG SL SUBL
0.40 | SUBLINGUAL_TABLET | SUBLINGUAL | Status: DC
Start: ? — End: 2019-04-04

## 2019-04-04 MED ORDER — ACETAMINOPHEN 325 MG PO TABS
650.00 | ORAL_TABLET | ORAL | Status: DC
Start: ? — End: 2019-04-04

## 2019-04-04 MED ORDER — BISMUTH SUBSALICYLATE 262 MG/15ML PO SUSP
30.00 | ORAL | Status: DC
Start: ? — End: 2019-04-04

## 2019-04-04 MED ORDER — INSULIN LISPRO 100 UNIT/ML ~~LOC~~ SOLN
5.00 | SUBCUTANEOUS | Status: DC
Start: 2019-04-14 — End: 2019-04-04

## 2019-04-12 ENCOUNTER — Other Ambulatory Visit: Payer: Self-pay | Admitting: Physician Assistant

## 2019-04-12 MED ORDER — FREESTYLE LIBRE 14 DAY SENSOR MISC: 1.0000 [IU] | 99 refills | Status: DC

## 2019-04-15 DIAGNOSIS — S88111A Complete traumatic amputation at level between knee and ankle, right lower leg, initial encounter: Secondary | ICD-10-CM | POA: Insufficient documentation

## 2019-04-15 MED ORDER — INSULIN GLARGINE 100 UNIT/ML ~~LOC~~ SOLN
8.00 | SUBCUTANEOUS | Status: DC
Start: 2019-04-15 — End: 2019-04-15

## 2019-04-15 MED ORDER — PANTOPRAZOLE SODIUM 40 MG PO TBEC
40.00 | DELAYED_RELEASE_TABLET | ORAL | Status: DC
Start: 2019-04-15 — End: 2019-04-15

## 2019-04-15 MED ORDER — ARIPIPRAZOLE 2 MG PO TABS
5.00 | ORAL_TABLET | ORAL | Status: DC
Start: 2019-04-15 — End: 2019-04-15

## 2019-04-15 MED ORDER — HYDROCODONE-ACETAMINOPHEN 10-325 MG PO TABS
1.00 | ORAL_TABLET | ORAL | Status: DC
Start: ? — End: 2019-04-15

## 2019-04-15 MED ORDER — HYDROMORPHONE HCL 1 MG/ML IJ SOLN
1.00 | INTRAMUSCULAR | Status: DC
Start: ? — End: 2019-04-15

## 2019-04-15 MED ORDER — INSULIN GLARGINE 100 UNIT/ML ~~LOC~~ SOLN
8.00 | SUBCUTANEOUS | Status: DC
Start: 2019-04-14 — End: 2019-04-15

## 2019-04-15 MED ORDER — Medication
Status: DC
Start: ? — End: 2019-04-15

## 2019-04-15 MED ORDER — HEPARIN SODIUM (PORCINE) 5000 UNIT/ML IJ SOLN
5000.00 | INTRAMUSCULAR | Status: DC
Start: 2019-04-14 — End: 2019-04-15

## 2019-04-17 ENCOUNTER — Other Ambulatory Visit: Payer: Self-pay | Admitting: Physician Assistant

## 2019-04-17 MED ORDER — FREESTYLE LIBRE 14 DAY READER DEVI: 1.0000 | 1 refills | Status: DC

## 2019-04-21 NOTE — Progress Notes (Deleted)
   Assessment & Plan:  ***  Follow up plan: No follow-ups on file.  Hendricks Limes, MSN, APRN, FNP-C [facility]  Subjective:   Patient ID: [name], female    DOB: 1981-04-28, 38 y.o.   MRN: [id number]  HPI: [name] is a 38 y.o. female presenting on 04/23/2019 for No chief complaint on file.  Patient admitted to [facility] on 03/28/2019 and discharged on 04/14/2019. She had a severe right foot infection, had been noncompliant, and missed appointments regarding the foot. 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 While in the hospital she did receive 1 units of PRBCs due to hgb of 7.4. She was seen by psychiatry while in the hospital due to uncontrolled depression; Abilify 10 mg QD was started. She is to follow-up with vascular surgery on 05/22/2019 at 9:30 AM.   Vitamin D def - 10.6 --- needs supplement Elevated TSH 7.4   ROS: Negative unless specifically indicated above in HPI.   Relevant past medical history reviewed and updated as indicated.   Allergies and medications reviewed and updated.   Current Outpatient Medications:  .  ALPRAZolam (XANAX) 0.5 MG tablet, Take 1 tablet (0.5 mg total) by mouth 2 (two) times daily as needed for anxiety., Disp: 60 tablet, Rfl: 0 .  cetirizine (ZYRTEC) 10 MG tablet, Take 10 mg by mouth daily., Disp: , Rfl:  .  Continuous Blood Gluc Receiver (FREESTYLE LIBRE 14 DAY READER) DEVI, 1 each by Does not apply route every 4 (four) hours., Disp: 1 each, Rfl: 1 .  Continuous Blood Gluc Sensor (FREESTYLE LIBRE 14 DAY SENSOR) MISC, 1 Units by Does not apply route every 2 (two) hours., Disp: 1 each, Rfl: prn .  escitalopram (LEXAPRO) 10 MG tablet, Take 1 tablet (10 mg total) by mouth daily., Disp: 30 tablet, Rfl: 5 .  Ferrous Gluconate (IRON 27 PO), Take 1 tablet by mouth daily. , Disp: , Rfl:  .  furosemide (LASIX) 20 MG tablet, Take 1 tablet (20  mg total) by mouth every other day as needed for fluid or edema. (Patient not taking: Reported on 12/10/2018), Disp: 10 tablet, Rfl: 0 .  insulin glargine (LANTUS) 100 UNIT/ML injection, Inject 0.05 mLs (5 Units total) into the skin 2 (two) times daily., Disp: 11 vial, Rfl: 0 .  insulin lispro (HUMALOG) 100 UNIT/ML cartridge, Inject 3-15 Units into the skin 3 (three) times daily with meals. Sliding Scale, Disp: , Rfl:  .  ketorolac (TORADOL) 10 MG tablet, Take 1 tablet (10 mg total) by mouth every 6 (six) hours as needed., Disp: 15 tablet, Rfl: 0 .  Multiple Vitamin (MULTIVITAMIN WITH MINERALS) TABS tablet, Take 1 tablet by mouth daily., Disp: , Rfl:  .  omeprazole (PRILOSEC) 20 MG capsule, Take 1 capsule (20 mg total) by mouth daily., Disp: 30 capsule, Rfl: 11 .  pantoprazole (PROTONIX) 20 MG tablet, Take 1 tablet (20 mg total) by mouth daily. (Patient not taking: Reported on 12/10/2018), Disp: 30 tablet, Rfl: 0  Allergies  Allergen Reactions  . Morphine Itching  . Codeine Nausea And Vomiting  . Sulfa Antibiotics Nausea And Vomiting    Objective:   There were no vitals taken for this visit.   Physical Exam

## 2019-04-23 ENCOUNTER — Ambulatory Visit: Payer: Self-pay | Admitting: Family Medicine

## 2019-05-02 ENCOUNTER — Other Ambulatory Visit: Payer: Self-pay

## 2019-05-02 ENCOUNTER — Ambulatory Visit (INDEPENDENT_AMBULATORY_CARE_PROVIDER_SITE_OTHER): Payer: Medicaid Other | Admitting: Family Medicine

## 2019-05-02 ENCOUNTER — Encounter: Payer: Self-pay | Admitting: Family Medicine

## 2019-05-02 DIAGNOSIS — K5903 Drug induced constipation: Secondary | ICD-10-CM | POA: Diagnosis not present

## 2019-05-02 MED ORDER — POLYETHYLENE GLYCOL 3350 17 GM/SCOOP PO POWD: 17.0000 g | Freq: Two times a day (BID) | ORAL | 1 refills | Status: DC | PRN

## 2019-05-02 NOTE — Patient Instructions (Addendum)
Thank you for coming in to clinic today.  1. Your symptoms are consistent with Constipation, likely cause of your General Abdominal Pain / Cramping. 2. Start with Miralax, prescription was sent to pharmacy. First dose 68g (4 capfuls) in 32oz water over 1 to 2 hours for clean out. Next day start 17g or 1 capful daily, may adjust dose up or down by half a capful every few days. Recommend to take this medicine daily for next 1-2 weeks, you may need to use it longer if needed. - Goal is to have soft regular bowel movement 1-3x daily, if too runny or diarrhea, then reduce dose of the medicine to every other day.  Improve water intake, hydration will help Also recommend increased vegetables, fruits, fiber intake Can try daily Metamucil or Fiber supplement at pharmacy over the counter  Follow-up if symptoms are not improving with bowel movements, or if pain worsens, develop fevers, nausea, vomiting.  Please schedule a follow-up appointment with Monia Pouch, FNP, in 1 month to follow-up Constipation  If you have any other questions or concerns, please feel free to call the clinic to contact me. You may also schedule an earlier appointment if necessary.  However, if your symptoms get significantly worse, please go to the Emergency Department to seek immediate medical attention. [place]
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
## 2019-05-06 ENCOUNTER — Other Ambulatory Visit: Payer: Self-pay | Admitting: Physician Assistant

## 2019-05-06 ENCOUNTER — Ambulatory Visit: Payer: Self-pay | Admitting: Family Medicine

## 2019-05-06 MED ORDER — CEPHALEXIN 500 MG PO CAPS: 500.0000 mg | ORAL_CAPSULE | Freq: Four times a day (QID) | ORAL | 0 refills | Status: DC

## 2019-05-11 ENCOUNTER — Other Ambulatory Visit: Payer: Self-pay | Admitting: Physician Assistant

## 2019-05-11 MED ORDER — DEXCOM G6 SENSOR MISC: 1.0000 | 99 refills | Status: AC

## 2019-05-13 ENCOUNTER — Other Ambulatory Visit: Payer: Self-pay | Admitting: Physician Assistant

## 2019-05-13 MED ORDER — HYOSCYAMINE SULFATE ER 0.375 MG PO TB12: 0.3750 mg | ORAL_TABLET | Freq: Two times a day (BID) | ORAL | 5 refills | Status: DC

## 2019-05-13 MED ORDER — ONDANSETRON 8 MG PO TBDP: 8.0000 mg | ORAL_TABLET | Freq: Three times a day (TID) | ORAL | 2 refills | Status: DC | PRN

## 2019-05-13 NOTE — Progress Notes (Signed)
Patient with continued IBS symptoms.  Diarrhea.  She has had this for many years.  We will try Levsin 1 twice daily.  She is tried over-the-counter medications without any resolved.  She has had some nausea.  We will send in Zofran for this.

## 2019-05-14 ENCOUNTER — Telehealth: Payer: Self-pay | Admitting: Physician Assistant

## 2019-05-14 NOTE — Telephone Encounter (Signed)
Can be by phone

## 2019-05-16 NOTE — Telephone Encounter (Signed)
Patient aware and this appt has been made.

## 2019-05-16 NOTE — Progress Notes (Signed)
Still haven't received anything for PA from insurance of pharmacy.

## 2019-05-20 ENCOUNTER — Ambulatory Visit (INDEPENDENT_AMBULATORY_CARE_PROVIDER_SITE_OTHER): Payer: Medicaid Other | Admitting: Physician Assistant

## 2019-05-20 DIAGNOSIS — K219 Gastro-esophageal reflux disease without esophagitis: Secondary | ICD-10-CM | POA: Diagnosis not present

## 2019-05-20 DIAGNOSIS — K580 Irritable bowel syndrome with diarrhea: Secondary | ICD-10-CM | POA: Diagnosis not present

## 2019-05-20 DIAGNOSIS — E1040 Type 1 diabetes mellitus with diabetic neuropathy, unspecified: Secondary | ICD-10-CM

## 2019-05-21 ENCOUNTER — Other Ambulatory Visit: Payer: Self-pay | Admitting: Physician Assistant

## 2019-05-21 ENCOUNTER — Encounter: Payer: Self-pay | Admitting: Physician Assistant

## 2019-05-21 MED ORDER — VIBERZI 100 MG PO TABS: 100.0000 mg | ORAL_TABLET | Freq: Two times a day (BID) | ORAL | 5 refills | Status: AC

## 2019-05-21 MED ORDER — LANSOPRAZOLE 30 MG PO CPDR: 30.0000 mg | DELAYED_RELEASE_CAPSULE | Freq: Every day | ORAL | 5 refills | Status: AC

## 2019-05-21 MED ORDER — INSULIN PEN NEEDLE 32G X 4 MM MISC: 1.0000 [IU] | Freq: Every day | 2 refills | Status: AC

## 2019-05-21 MED ORDER — ESCITALOPRAM OXALATE 10 MG PO TABS: 10.0000 mg | ORAL_TABLET | Freq: Every day | ORAL | 11 refills | Status: AC

## 2019-05-21 MED ORDER — ARIPIPRAZOLE 5 MG PO TABS: 5.0000 mg | ORAL_TABLET | Freq: Every day | ORAL | 11 refills | Status: AC
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
## 2019-05-22 ENCOUNTER — Telehealth: Payer: Self-pay | Admitting: *Deleted

## 2019-05-22 NOTE — Telephone Encounter (Signed)
Prior Auth for Abilify 5mg -APPROVED 05/22/2019 through 05/21/2020  PA# [id number]  [employer]

## 2019-05-22 NOTE — Telephone Encounter (Signed)
Prior Auth for Viberzi 100mg  tab-APPROVED 05/22/2019 through 05/21/2020  PA# [id number]  [employer]

## 2019-05-28 ENCOUNTER — Other Ambulatory Visit: Payer: Self-pay | Admitting: Physician Assistant

## 2019-05-28 NOTE — Progress Notes (Signed)
Letter faxed.

## 2019-06-09 ENCOUNTER — Other Ambulatory Visit: Payer: Self-pay | Admitting: Physician Assistant

## 2019-06-11 ENCOUNTER — Telehealth: Payer: Self-pay | Admitting: Physician Assistant

## 2019-06-18 NOTE — Telephone Encounter (Signed)
Dismissed patient

## 2019-06-25 ENCOUNTER — Encounter (HOSPITAL_COMMUNITY): Payer: Self-pay | Admitting: Emergency Medicine

## 2019-06-25 ENCOUNTER — Emergency Department (HOSPITAL_COMMUNITY)
Admission: EM | Admit: 2019-06-25 | Discharge: 2019-06-26 | Disposition: A | Discharge status: Home / Self Care | Payer: Medicaid Other | Attending: Emergency Medicine | Admitting: Emergency Medicine

## 2019-06-25 ENCOUNTER — Other Ambulatory Visit: Payer: Self-pay

## 2019-06-25 DIAGNOSIS — E10649 Type 1 diabetes mellitus with hypoglycemia without coma: Secondary | ICD-10-CM | POA: Diagnosis not present

## 2019-06-25 DIAGNOSIS — Z79899 Other long term (current) drug therapy: Secondary | ICD-10-CM | POA: Insufficient documentation

## 2019-06-25 DIAGNOSIS — D649 Anemia, unspecified: Secondary | ICD-10-CM | POA: Diagnosis not present

## 2019-06-25 DIAGNOSIS — E162 Hypoglycemia, unspecified: Secondary | ICD-10-CM

## 2019-06-25 LAB — POC URINE PREG, ED: Preg Test, Ur: NEGATIVE

## 2019-06-25 LAB — CBG MONITORING, ED: Glucose-Capillary: 209 mg/dL — ABNORMAL HIGH (ref 70–99)

## 2019-06-25 NOTE — ED Notes (Addendum)
Pt given sandwich and chips

## 2019-06-25 NOTE — ED Triage Notes (Signed)
Pt states she took her blood sugar at home and read error so she had cake icing. Ems reading was 102 and then 204. Pt c/o tingling in tongue and jaw.
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
## 2019-06-26 ENCOUNTER — Telehealth: Payer: Self-pay | Admitting: *Deleted

## 2019-06-26 LAB — CBG MONITORING, ED
Glucose-Capillary: 340 mg/dL — ABNORMAL HIGH (ref 70–99)
Glucose-Capillary: 350 mg/dL — ABNORMAL HIGH (ref 70–99)

## 2019-06-26 LAB — CBC WITH DIFFERENTIAL/PLATELET
Abs Immature Granulocytes: 0.03 10*3/uL (ref 0.00–0.07)
Basophils Absolute: 0 10*3/uL (ref 0.0–0.1)
Basophils Relative: 0 %
Eosinophils Absolute: 1.1 10*3/uL — ABNORMAL HIGH (ref 0.0–0.5)
Eosinophils Relative: 14 %
HCT: 35.3 % — ABNORMAL LOW (ref 36.0–46.0)
Hemoglobin: 11.1 g/dL — ABNORMAL LOW (ref 12.0–15.0)
Immature Granulocytes: 0 %
Lymphocytes Relative: 24 %
Lymphs Abs: 1.9 10*3/uL (ref 0.7–4.0)
MCH: 29.4 pg (ref 26.0–34.0)
MCHC: 31.4 g/dL (ref 30.0–36.0)
MCV: 93.4 fL (ref 80.0–100.0)
Monocytes Absolute: 0.6 10*3/uL (ref 0.1–1.0)
Monocytes Relative: 8 %
Neutro Abs: 4.2 10*3/uL (ref 1.7–7.7)
Neutrophils Relative %: 54 %
Platelets: 332 10*3/uL (ref 150–400)
RBC: 3.78 MIL/uL — ABNORMAL LOW (ref 3.87–5.11)
RDW: 13.5 % (ref 11.5–15.5)
WBC: 7.9 10*3/uL (ref 4.0–10.5)
nRBC: 0 % (ref 0.0–0.2)

## 2019-06-26 LAB — BASIC METABOLIC PANEL
Anion gap: 10 (ref 5–15)
BUN: 15 mg/dL (ref 6–20)
CO2: 20 mmol/L — ABNORMAL LOW (ref 22–32)
Calcium: 8 mg/dL — ABNORMAL LOW (ref 8.9–10.3)
Chloride: 107 mmol/L (ref 98–111)
Creatinine, Ser: 0.72 mg/dL (ref 0.44–1.00)
GFR calc Af Amer: >60 mL/min (ref >60)
GFR calc non Af Amer: >60 mL/min (ref >60)
Glucose, Bld: 316 mg/dL — ABNORMAL HIGH (ref 70–99)
Potassium: 3.4 mmol/L — ABNORMAL LOW (ref 3.5–5.1)
Sodium: 137 mmol/L (ref 135–145)

## 2019-06-26 NOTE — ED Notes (Signed)
Pt waiting in room on ride

## 2019-06-26 NOTE — Telephone Encounter (Signed)
VM from [place] [facility]/ [facility] Pt has been discharged from nursing with [facility] Would like PT for recent amputation, pt is about to get out to PT, would like referral to PT in from of our office Please advise

## 2019-06-26 NOTE — Discharge Instructions (Addendum)
Continue with your diabetes management at home.

## 2019-06-29 NOTE — Telephone Encounter (Signed)
Does it need to go to [facility] or transfer to outpatient center?

## 2019-06-30 ENCOUNTER — Other Ambulatory Visit: Payer: Self-pay | Admitting: Physician Assistant

## 2019-06-30 DIAGNOSIS — E11610 Type 2 diabetes mellitus with diabetic neuropathic arthropathy: Secondary | ICD-10-CM

## 2019-06-30 DIAGNOSIS — Z449 Encounter for fitting and adjustment of unspecified external prosthetic device: Secondary | ICD-10-CM

## 2019-06-30 DIAGNOSIS — S88111A Complete traumatic amputation at level between knee and ankle, right lower leg, initial encounter: Secondary | ICD-10-CM

## 2019-06-30 DIAGNOSIS — R2689 Other abnormalities of gait and mobility: Secondary | ICD-10-CM

## 2019-06-30 NOTE — Telephone Encounter (Signed)
Order placed

## 2019-06-30 NOTE — Telephone Encounter (Signed)
Patient is able to get out of house for PT, would like referral to PT to outpatient in front of our building

## 2019-07-01 ENCOUNTER — Other Ambulatory Visit: Payer: Self-pay | Admitting: Physician Assistant

## 2019-07-01 DIAGNOSIS — S88111A Complete traumatic amputation at level between knee and ankle, right lower leg, initial encounter: Secondary | ICD-10-CM

## 2019-07-01 DIAGNOSIS — E11610 Type 2 diabetes mellitus with diabetic neuropathic arthropathy: Secondary | ICD-10-CM

## 2019-07-02 ENCOUNTER — Other Ambulatory Visit: Payer: Self-pay | Admitting: Physician Assistant

## 2019-07-02 NOTE — Progress Notes (Signed)
Can you forward the letter to [facility]? It was sent there before.

## 2019-07-02 NOTE — Progress Notes (Signed)
Letter Faxed

## 2019-07-04 ENCOUNTER — Telehealth: Payer: Self-pay | Admitting: Physician Assistant

## 2019-07-04 NOTE — Telephone Encounter (Signed)
On 07/01/19 it was placed

## 2019-07-04 NOTE — Telephone Encounter (Signed)
Needs NEURO REHAB referral  Located in [place]  Phone [phone]

## 2019-07-04 NOTE — Telephone Encounter (Signed)
I placed the order earlier this week.

## 2019-07-04 NOTE — Telephone Encounter (Signed)
Sent to referral to check on

## 2019-07-15 ENCOUNTER — Other Ambulatory Visit: Payer: Self-pay | Admitting: Physician Assistant

## 2019-07-15 MED ORDER — DOXYCYCLINE HYCLATE 100 MG PO TABS: 100.0000 mg | ORAL_TABLET | Freq: Two times a day (BID) | ORAL | 0 refills | Status: AC

## 2019-07-21 ENCOUNTER — Encounter: Payer: Medicaid Other | Admitting: Physical Therapy

## 2019-07-21 ENCOUNTER — Ambulatory Visit: Payer: Medicaid Other | Attending: Physician Assistant | Admitting: Physical Therapy

## 2019-07-21 ENCOUNTER — Other Ambulatory Visit: Payer: Self-pay

## 2019-07-21 DIAGNOSIS — R2681 Unsteadiness on feet: Secondary | ICD-10-CM | POA: Diagnosis present

## 2019-07-21 DIAGNOSIS — R2689 Other abnormalities of gait and mobility: Secondary | ICD-10-CM | POA: Diagnosis present

## 2019-07-21 DIAGNOSIS — Z9181 History of falling: Secondary | ICD-10-CM | POA: Insufficient documentation

## 2019-07-21 DIAGNOSIS — R293 Abnormal posture: Secondary | ICD-10-CM | POA: Diagnosis present

## 2019-07-21 DIAGNOSIS — M6281 Muscle weakness (generalized): Secondary | ICD-10-CM

## 2019-07-21 NOTE — Patient Instructions (Signed)
[employer] Socks:  1-ply is thin no color at top,  3-ply is yellow at top,  5-ply is green at top How many ply you need depends on your limb size.  You should have even pressure on your limb when standing & walking.  Guidance points: 1. How ease it goes on? Should be some resistance. Too few it goes on too easily. Too many it takes a lot of work to get it on. 2. How many clicks you get. Especially clicks in sitting. 3. After standing or walking, check knee cap. Bottom should be just under the front lip.  Too few bottom of knee cap sits on indention. Too many bottom is above front lip. 4. Have your feet beside each other & hips over feet. Place hands on your waist. Pelvis Should be level. Too few prosthetic side will be low. Too many prosthetic side will be high.    Get ply socks correct before you leave the house. Take extra socks with you. Take one 3-ply and two 1-ply with you. This is in addition to what you are wearing.   
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
## 2019-07-29 ENCOUNTER — Other Ambulatory Visit: Payer: Self-pay | Admitting: Physician Assistant

## 2019-07-29 ENCOUNTER — Telehealth: Payer: Self-pay | Admitting: *Deleted

## 2019-07-29 MED ORDER — LANTUS SOLOSTAR 100 UNIT/ML ~~LOC~~ SOPN: 17.0000 [IU] | PEN_INJECTOR | Freq: Every day | SUBCUTANEOUS | 99 refills | Status: AC

## 2019-07-29 NOTE — Telephone Encounter (Signed)
Ordered

## 2019-07-29 NOTE — Telephone Encounter (Signed)
Fax from [facility] request for Lantus solostar inject 17 u BID Not on pt's current med list Please advise

## 2019-07-30 ENCOUNTER — Ambulatory Visit: Payer: Medicaid Other | Admitting: Physical Therapy

## 2019-07-30 ENCOUNTER — Encounter: Payer: Self-pay | Admitting: Physical Therapy

## 2019-07-30 ENCOUNTER — Other Ambulatory Visit: Payer: Self-pay

## 2019-07-30 DIAGNOSIS — Z9181 History of falling: Secondary | ICD-10-CM

## 2019-07-30 DIAGNOSIS — R2689 Other abnormalities of gait and mobility: Secondary | ICD-10-CM

## 2019-07-30 DIAGNOSIS — R293 Abnormal posture: Secondary | ICD-10-CM

## 2019-07-30 DIAGNOSIS — M6281 Muscle weakness (generalized): Secondary | ICD-10-CM

## 2019-07-30 DIAGNOSIS — R2681 Unsteadiness on feet: Secondary | ICD-10-CM
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
## 2019-07-30 NOTE — Patient Instructions (Addendum)
Do each exercise 1-2  times per day Do each exercise 5-10 repetitions Hold each exercise for 2 seconds to feel your location  AT [place].  USE TAPE ON FLOOR TO MARK THE MIDLINE POSITION. You also should try to feel with your limb pressure in socket.  You are trying to feel with limb what you used to feel with the bottom of your foot.  1. Side to Side Shift: Moving your hips only (not shoulders): move weight onto your left leg, HOLD/FEEL.  Move back to equal weight on each leg, HOLD/FEEL. Move weight onto your right leg, HOLD/FEEL. Move back to equal weight on each leg, HOLD/FEEL. Repeat. 2. Front to Back Shift: Moving your hips only (not shoulders): move your weight forward onto your toes, HOLD/FEEL. Move your weight back to equal Flat Foot on both legs, HOLD/FEEL. Move your weight back onto your heels, HOLD/FEEL. Move your weight back to equal on both legs, HOLD/FEEL. Repeat. 3. Moving Cones / Cups: With equal weight on each leg: Hold on with one hand the first time, then progress to no hand supports. Move cups from one side of sink to the other. Place cups ~2" out of your reach, progress to 10" beyond reach. 4. Overhead/Upward Reaching: alternated reaching up to top cabinets or ceiling if no cabinets present. Keep equal weight on each leg. Start with one hand support on counter while other hand reaches and progress to no hand support with reaching. 5.   Looking Over Shoulders: With equal weight on each leg: alternate turning to look          over your shoulders with one hand support on counter as needed. Shift weight to             side looking, pull hip then shoulder then head/eyes around to look behind you. Start       with one hand support & progress to no hand support. 6.   Slow toe taps into cabinet: Moving your hips only (not shoulders): move weight onto your right leg, hold slightly, slowly bring left leg up to toe tap into  the lower cabinet. Start with hand support on counter, when too easier, take one hand off, and if that becomes too easy, have hands hovering over counter top for safety.

## 2019-08-06 ENCOUNTER — Ambulatory Visit: Payer: Medicaid Other | Admitting: Physical Therapy

## 2019-08-06 ENCOUNTER — Other Ambulatory Visit: Payer: Self-pay

## 2019-08-06 ENCOUNTER — Encounter: Payer: Self-pay | Admitting: Physical Therapy

## 2019-08-06 DIAGNOSIS — R2681 Unsteadiness on feet: Secondary | ICD-10-CM

## 2019-08-06 DIAGNOSIS — R2689 Other abnormalities of gait and mobility: Secondary | ICD-10-CM | POA: Diagnosis not present

## 2019-08-06 DIAGNOSIS — M6281 Muscle weakness (generalized): Secondary | ICD-10-CM

## 2019-08-06 DIAGNOSIS — R293 Abnormal posture: Secondary | ICD-10-CM

## 2019-08-06 NOTE — Patient Instructions (Signed)
Driving options with Below Knee Prosthesis   Option 1:  Remove prosthesis and use left leg to crossover drive Option 2:  2 Foot driving - right foot on gas & left foot on brake.  Prosthetic motion is leg press motion not ankle pump.  Focus is on heel pressing out not on toes/forefoot.   Option 3: Use prosthesis on both gas & brake.  Continue to focus on heel motion & position.  Option 4: Left foot accelerator.  Consider portable left foot accelerator ( http://plfa.org ) as allows to switch between cars or remove for other drivers.  Left foot both gas & brake.  Using cruise control to accelerate & decelerate or resume speed after stopping can safe some leg work / motion.   Practice initially in large empty parking lot. Stay in middle not near curbs. Practice turning & backing into parking spot on right & left. Parallel park.  Back up.  Quick stops. More about learning foot work not staying in motion.   
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
## 2019-08-18 ENCOUNTER — Ambulatory Visit: Payer: Medicaid Other | Admitting: Physical Therapy

## 2019-08-18 ENCOUNTER — Other Ambulatory Visit: Payer: Self-pay

## 2019-08-18 ENCOUNTER — Encounter: Payer: Self-pay | Admitting: Physical Therapy

## 2019-08-18 DIAGNOSIS — R293 Abnormal posture: Secondary | ICD-10-CM

## 2019-08-18 DIAGNOSIS — R2689 Other abnormalities of gait and mobility: Secondary | ICD-10-CM

## 2019-08-18 DIAGNOSIS — R2681 Unsteadiness on feet: Secondary | ICD-10-CM

## 2019-08-18 DIAGNOSIS — M6281 Muscle weakness (generalized): Secondary | ICD-10-CM
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
## 2019-08-27 ENCOUNTER — Ambulatory Visit: Payer: Medicaid Other | Attending: Physician Assistant | Admitting: Physical Therapy

## 2019-08-27 ENCOUNTER — Encounter: Payer: Self-pay | Admitting: Physical Therapy

## 2019-08-27 ENCOUNTER — Other Ambulatory Visit: Payer: Self-pay

## 2019-08-27 DIAGNOSIS — M6281 Muscle weakness (generalized): Secondary | ICD-10-CM | POA: Insufficient documentation

## 2019-08-27 DIAGNOSIS — R293 Abnormal posture: Secondary | ICD-10-CM | POA: Diagnosis present

## 2019-08-27 DIAGNOSIS — R2681 Unsteadiness on feet: Secondary | ICD-10-CM

## 2019-08-27 DIAGNOSIS — R2689 Other abnormalities of gait and mobility: Secondary | ICD-10-CM | POA: Diagnosis not present
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
## 2019-08-29 ENCOUNTER — Other Ambulatory Visit: Payer: Self-pay | Admitting: Physician Assistant

## 2019-08-29 ENCOUNTER — Telehealth: Payer: Self-pay | Admitting: Physician Assistant

## 2019-08-29 DIAGNOSIS — K580 Irritable bowel syndrome with diarrhea: Secondary | ICD-10-CM

## 2019-08-29 NOTE — Telephone Encounter (Signed)
Referral order placed.

## 2019-08-29 NOTE — Telephone Encounter (Signed)
Pt aware.

## 2019-09-02 ENCOUNTER — Encounter: Payer: Self-pay | Admitting: Gastroenterology

## 2019-09-03 ENCOUNTER — Encounter: Payer: Self-pay | Admitting: Physical Therapy

## 2019-09-04 ENCOUNTER — Ambulatory Visit: Payer: Medicaid Other | Admitting: Physical Therapy

## 2019-09-04 ENCOUNTER — Ambulatory Visit: Payer: Medicaid Other | Admitting: Nurse Practitioner

## 2019-09-10 ENCOUNTER — Encounter: Payer: Self-pay | Admitting: Physical Therapy

## 2019-09-10 ENCOUNTER — Ambulatory Visit: Payer: Medicaid Other | Admitting: Physical Therapy

## 2019-09-10 ENCOUNTER — Other Ambulatory Visit: Payer: Self-pay

## 2019-09-10 ENCOUNTER — Ambulatory Visit: Payer: Medicaid Other | Admitting: Gastroenterology

## 2019-09-10 DIAGNOSIS — R2689 Other abnormalities of gait and mobility: Secondary | ICD-10-CM | POA: Diagnosis not present

## 2019-09-10 DIAGNOSIS — R2681 Unsteadiness on feet: Secondary | ICD-10-CM

## 2019-09-10 DIAGNOSIS — M6281 Muscle weakness (generalized): Secondary | ICD-10-CM

## 2019-09-10 DIAGNOSIS — R293 Abnormal posture: Secondary | ICD-10-CM
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
## 2019-09-10 NOTE — Patient Instructions (Signed)
Near Counter on right side: walk forward on line heel-toe then walk backwards (Not on line) In hall or open clear area: walk 3 steps with eyes open & 3 steps eyes closed Walk looking right & left. Goal is not weaving or slowing.  Walk looking up & down.

## 2019-09-16 ENCOUNTER — Ambulatory Visit: Payer: Medicaid Other | Admitting: Physical Therapy

## 2019-09-16 ENCOUNTER — Encounter: Payer: Self-pay | Admitting: Physical Therapy

## 2019-09-16 ENCOUNTER — Other Ambulatory Visit: Payer: Self-pay

## 2019-09-16 DIAGNOSIS — R2689 Other abnormalities of gait and mobility: Secondary | ICD-10-CM

## 2019-09-16 DIAGNOSIS — R2681 Unsteadiness on feet: Secondary | ICD-10-CM

## 2019-09-16 DIAGNOSIS — R293 Abnormal posture: Secondary | ICD-10-CM

## 2019-09-16 DIAGNOSIS — M6281 Muscle weakness (generalized): Secondary | ICD-10-CM
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
## 2019-09-16 NOTE — Patient Instructions (Signed)
Access Code: [email or web address]  URL: [email or web address]  Date: 09/16/2019  Prepared by: Willow Ora   Exercises Supine Bridge - 10 reps - 1 sets - 5 hold - 1x daily - 5x weekly Hooklying Clamshell with Resistance - 10 reps - 1 sets - 5 hold - 1x daily - 5x weekly Sit to Stand with Resistance Around Legs - 10 reps - 1 sets - 1x daily - 5x weekly Supine Hip Flexor Stretch with Weight - 3 reps - 1 sets - 60 hold - 1x daily - 5x weekly

## 2019-09-17 ENCOUNTER — Encounter: Payer: Medicaid Other | Admitting: Physical Therapy

## 2019-09-22 ENCOUNTER — Ambulatory Visit: Payer: Medicaid Other | Admitting: Gastroenterology

## 2019-09-24 ENCOUNTER — Ambulatory Visit: Payer: Medicaid Other | Attending: Physician Assistant | Admitting: Physical Therapy

## 2019-09-24 ENCOUNTER — Other Ambulatory Visit: Payer: Self-pay

## 2019-09-24 ENCOUNTER — Encounter: Payer: Self-pay | Admitting: Physical Therapy

## 2019-09-24 DIAGNOSIS — M6281 Muscle weakness (generalized): Secondary | ICD-10-CM | POA: Insufficient documentation

## 2019-09-24 DIAGNOSIS — R2689 Other abnormalities of gait and mobility: Secondary | ICD-10-CM

## 2019-09-24 DIAGNOSIS — R293 Abnormal posture: Secondary | ICD-10-CM

## 2019-09-24 DIAGNOSIS — R2681 Unsteadiness on feet: Secondary | ICD-10-CM | POA: Insufficient documentation

## 2019-09-24 NOTE — Patient Instructions (Signed)
Access Code: [email or web address]  URL: [email or web address]  Date: 09/24/2019  Prepared by: Vladimir Faster   Exercises Alternating Punch with Resistance - 10 reps - 1 sets - 5 seconds hold - 1x daily - 7x weekly Standing Scapular Protraction with Resistance - 10 reps - 1 sets - 5 seconds hold - 1x daily - 7x weekly Standing Row with Anchored Resistance - 10 reps - 1 sets - 5 seconds hold - 1x daily - 7x weekly Standing alternate rows with resistance - 10 reps - 1 sets - 5 seconds hold - 1x daily - 7x weekly  Standing Biceps Curls with resistance - 10 reps - 1 sets - 5 seconds hold - 1x daily - 7x weekly  Standing Hip Flexion with Resistance - 10 reps - 1 sets - 1x daily - 7x weekly Standing Hip Extension with Resistance - 10 reps - 1 sets - 1x daily - 7x weekly Standing Hip Adduction with Resistance - 10 reps - 1 sets - 1x daily - 7x weekly Standing Hip Abduction with Theraband Resistance - 10 reps - 1 sets - 1x daily - 7x weekly
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
## 2019-10-01 ENCOUNTER — Ambulatory Visit: Payer: Medicaid Other | Admitting: Physical Therapy

## 2019-10-08 ENCOUNTER — Other Ambulatory Visit: Payer: Self-pay

## 2019-10-08 ENCOUNTER — Ambulatory Visit: Payer: Medicaid Other | Admitting: Physical Therapy

## 2019-10-08 ENCOUNTER — Encounter: Payer: Self-pay | Admitting: Physical Therapy

## 2019-10-08 DIAGNOSIS — R2681 Unsteadiness on feet: Secondary | ICD-10-CM

## 2019-10-08 DIAGNOSIS — R293 Abnormal posture: Secondary | ICD-10-CM

## 2019-10-08 DIAGNOSIS — R2689 Other abnormalities of gait and mobility: Secondary | ICD-10-CM

## 2019-10-08 DIAGNOSIS — M6281 Muscle weakness (generalized): Secondary | ICD-10-CM

## 2019-10-08 NOTE — Patient Instructions (Signed)
At sink: 1.place one foot in front of other like standing on a line. Let go of sink and balance up to 30 seconds. Switch leg in front. 2.Place front half of one foot in lower cabinet. Let go of sink and balance as long as possible. Switch which foot is in counter, Do 3 reps on each foot. 3.Alternate tapping toes on lower cabinet shelf.
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
## 2019-10-20 ENCOUNTER — Other Ambulatory Visit: Payer: Self-pay

## 2019-10-20 ENCOUNTER — Encounter: Payer: Self-pay | Admitting: Physical Therapy

## 2019-10-20 ENCOUNTER — Ambulatory Visit: Payer: Medicaid Other | Attending: Physician Assistant | Admitting: Physical Therapy

## 2019-10-20 DIAGNOSIS — Z9181 History of falling: Secondary | ICD-10-CM

## 2019-10-20 DIAGNOSIS — R293 Abnormal posture: Secondary | ICD-10-CM

## 2019-10-20 DIAGNOSIS — R2689 Other abnormalities of gait and mobility: Secondary | ICD-10-CM | POA: Insufficient documentation

## 2019-10-20 DIAGNOSIS — M6281 Muscle weakness (generalized): Secondary | ICD-10-CM | POA: Diagnosis present

## 2019-10-20 DIAGNOSIS — R2681 Unsteadiness on feet: Secondary | ICD-10-CM | POA: Diagnosis present

## 2019-10-20 NOTE — Patient Instructions (Signed)
Access Code: [email or web address]  URL: [email or web address]  Date: 10/20/2019  Prepared by: Vladimir Faster   Exercises Standing Single Leg Ball Rolling in Circles - 10 reps - 1 sets - 5 seconds hold - 1x daily - 5x weekly Standing Single Leg Back and Forth Ball Rolling (BKA) - 10 reps - 1 sets - 5 seconds hold - 1x daily - 5x weekly Standing Single Leg Lateral Ball Rolling (BKA) - 10 reps - 1 sets - 5 seconds hold - 1x daily - 5x weekly Romberg Stance with Head Rotation - 10 reps - 1 sets - 5 seconds hold - 1x daily - 5x weekly
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
## 2019-10-29 ENCOUNTER — Other Ambulatory Visit: Payer: Self-pay

## 2019-10-29 ENCOUNTER — Encounter: Payer: Self-pay | Admitting: Physical Therapy

## 2019-10-29 ENCOUNTER — Ambulatory Visit: Payer: Medicaid Other | Admitting: Physical Therapy

## 2019-10-29 DIAGNOSIS — R2689 Other abnormalities of gait and mobility: Secondary | ICD-10-CM

## 2019-10-29 DIAGNOSIS — R293 Abnormal posture: Secondary | ICD-10-CM

## 2019-10-29 DIAGNOSIS — Z9181 History of falling: Secondary | ICD-10-CM

## 2019-10-29 DIAGNOSIS — R2681 Unsteadiness on feet: Secondary | ICD-10-CM

## 2019-10-29 DIAGNOSIS — M6281 Muscle weakness (generalized): Secondary | ICD-10-CM
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
## 2019-11-05 ENCOUNTER — Other Ambulatory Visit: Payer: Self-pay

## 2019-11-05 ENCOUNTER — Encounter: Payer: Self-pay | Admitting: Physical Therapy

## 2019-11-05 ENCOUNTER — Ambulatory Visit: Payer: Medicaid Other | Admitting: Physical Therapy

## 2019-11-05 DIAGNOSIS — R2689 Other abnormalities of gait and mobility: Secondary | ICD-10-CM | POA: Diagnosis not present

## 2019-11-05 DIAGNOSIS — Z9181 History of falling: Secondary | ICD-10-CM

## 2019-11-05 DIAGNOSIS — M6281 Muscle weakness (generalized): Secondary | ICD-10-CM

## 2019-11-05 DIAGNOSIS — R293 Abnormal posture: Secondary | ICD-10-CM

## 2019-11-05 DIAGNOSIS — R2681 Unsteadiness on feet: Secondary | ICD-10-CM
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
## 2020-02-21 IMAGING — CT CT RENAL STONE PROTOCOL
2 of 4 series · 16 of 46 positions shown, 18 images · non-contrast
Comparison: None.

CLINICAL DATA: Right flank pain with nausea

EXAM:
CT ABDOMEN AND PELVIS WITHOUT CONTRAST
TECHNIQUE: Multidetector CT imaging of the abdomen and pelvis was performed
following the standard protocol without oral or IV contrast.

[Series 3: renal stone 5.0 · axial · 0.72mm/px · z∈[+967,+1397]mm · 13 of 98 slices shown, 15 images]
[im 8/98  soft-tissue]
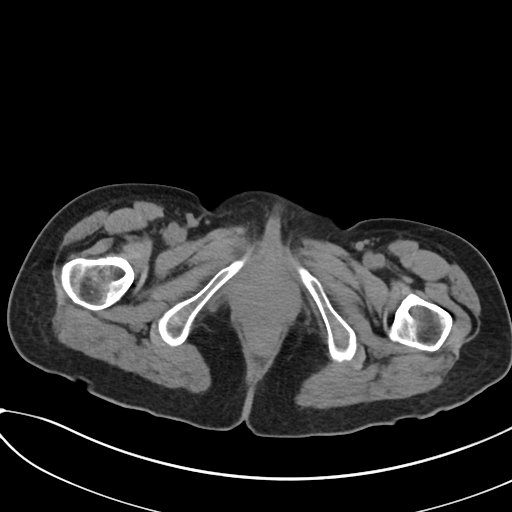
[im 8/98  bone]
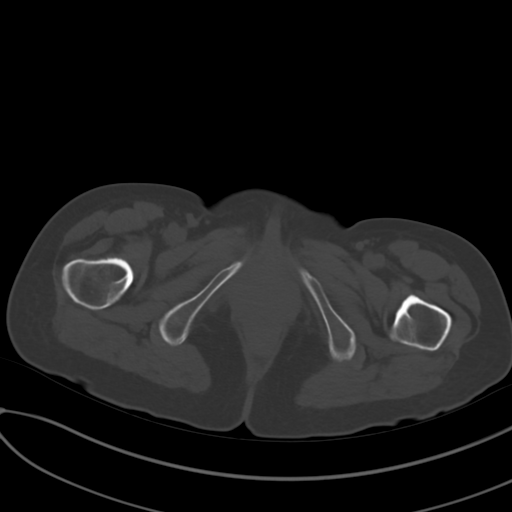
[im 15/98  soft-tissue]
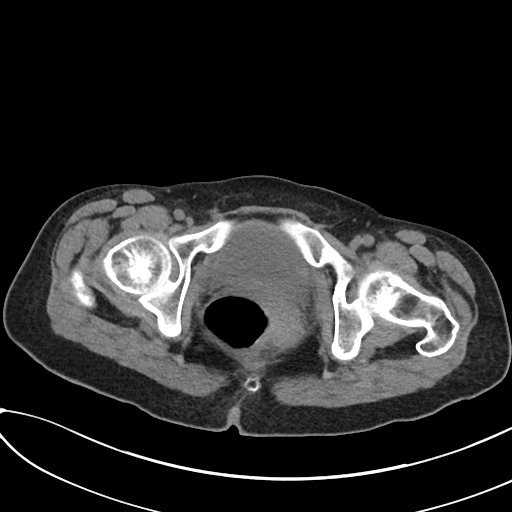
[im 22/98  soft-tissue]
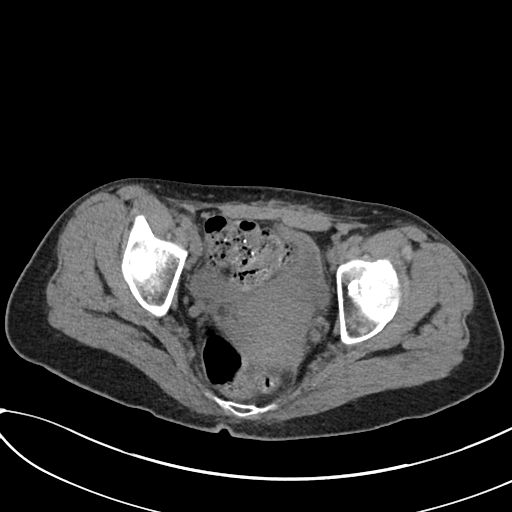
[im 29/98  soft-tissue]
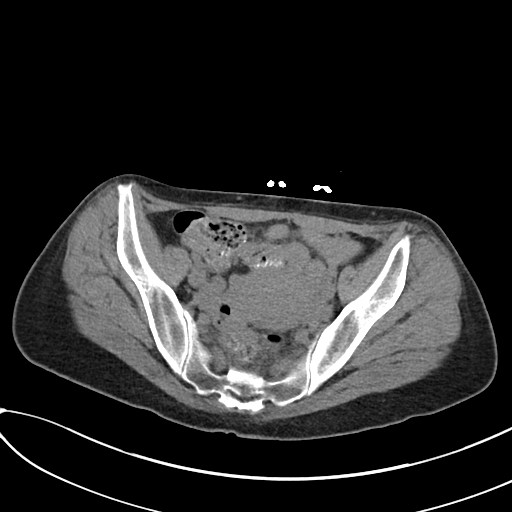
[im 36/98  soft-tissue]
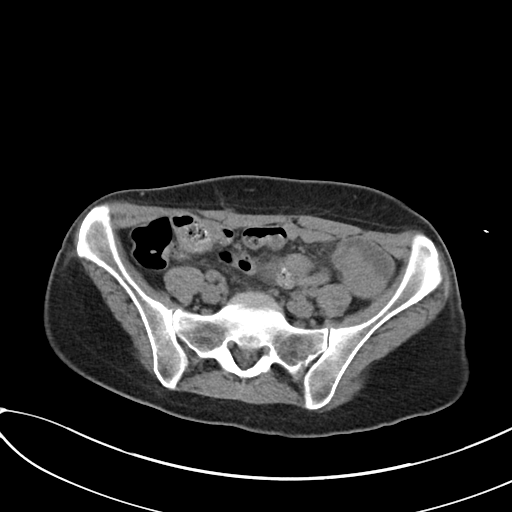
[im 44/98  soft-tissue]
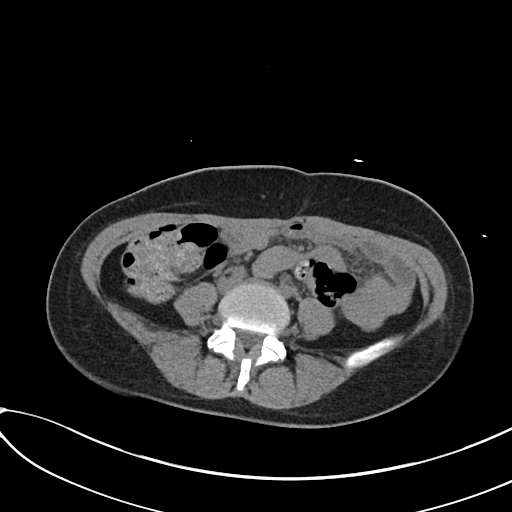
[im 51/98  soft-tissue]
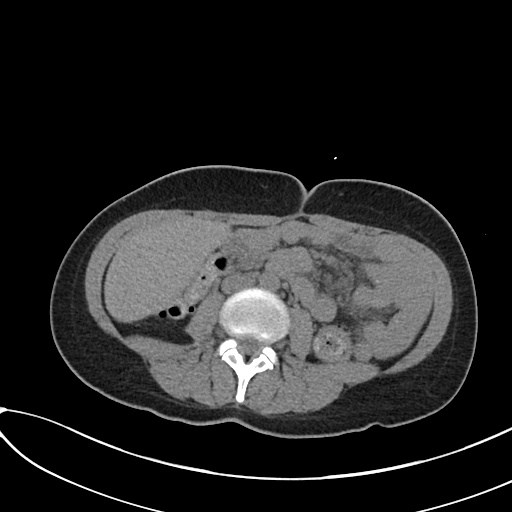
[im 58/98  soft-tissue]
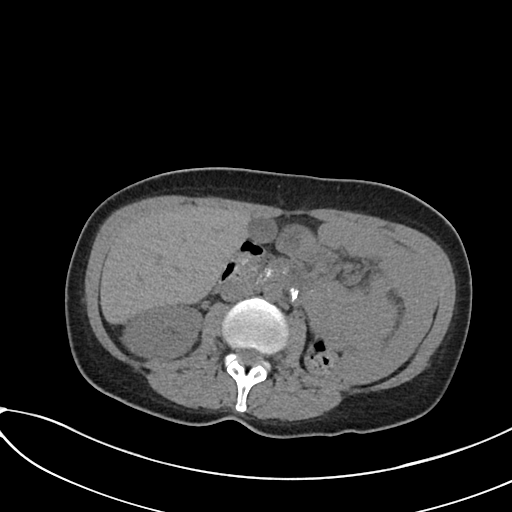
[im 65/98  soft-tissue]
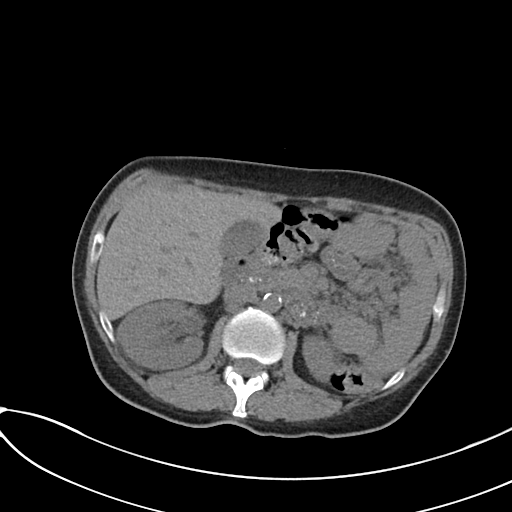
[im 65/98  bone]
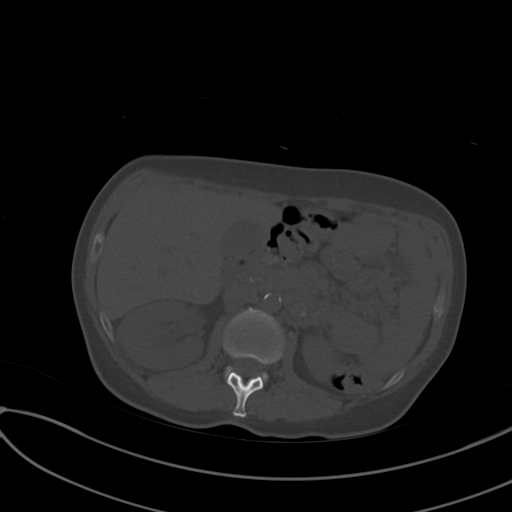
[im 72/98  soft-tissue]
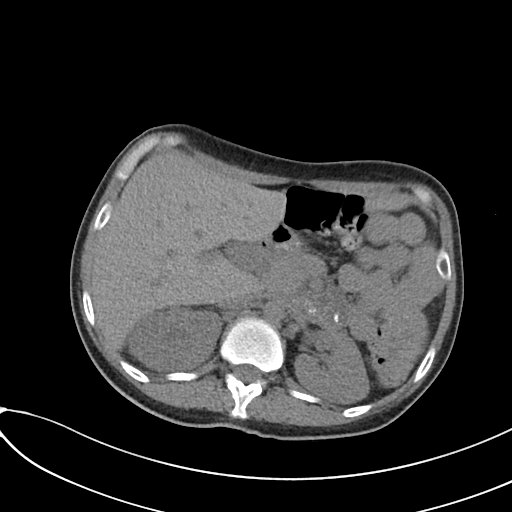
[im 80/98  soft-tissue]
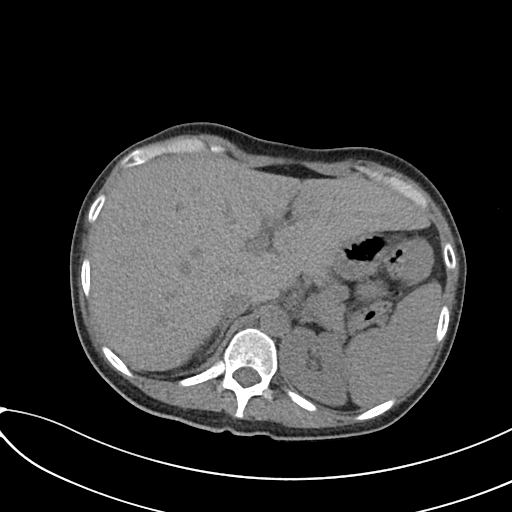
[im 87/98  soft-tissue]
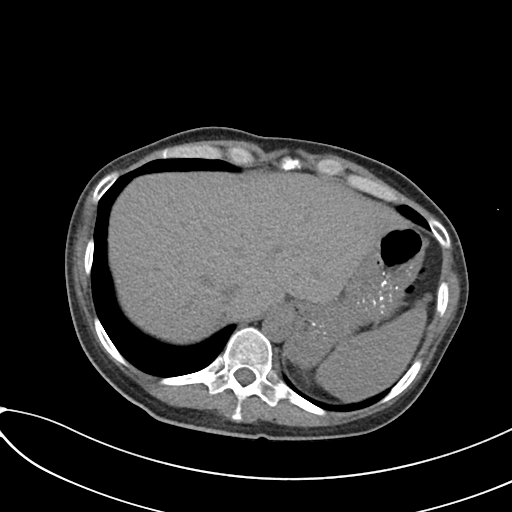
[im 94/98  soft-tissue]
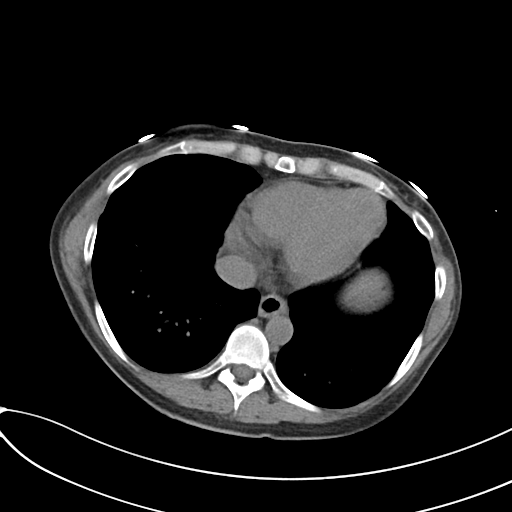

[Series 5: renal stone 3.0 cor · coronal · 1.01mm/px · 3 of 94 slices shown]
[im 32/94  soft-tissue]
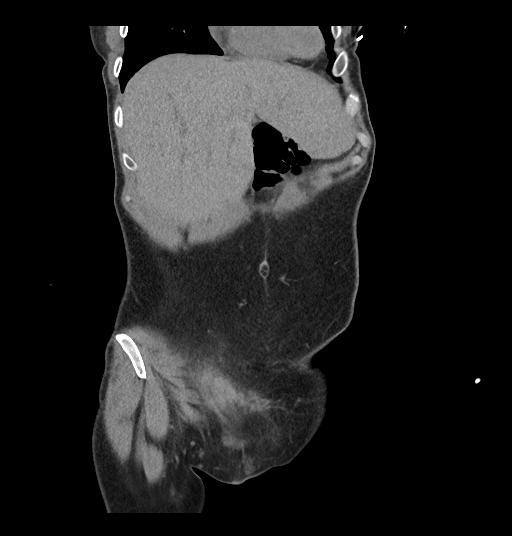
[im 42/94  soft-tissue]
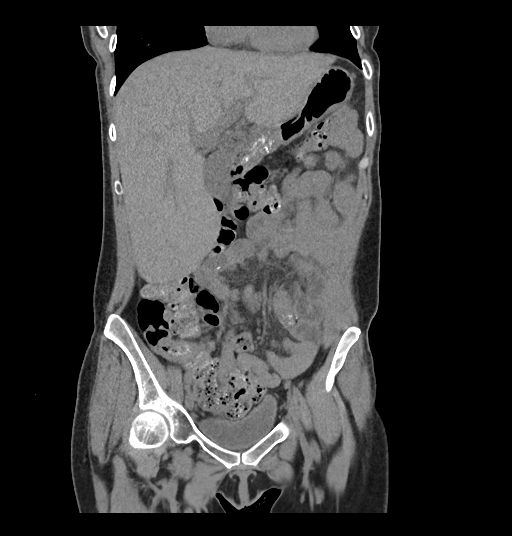
[im 52/94  soft-tissue]
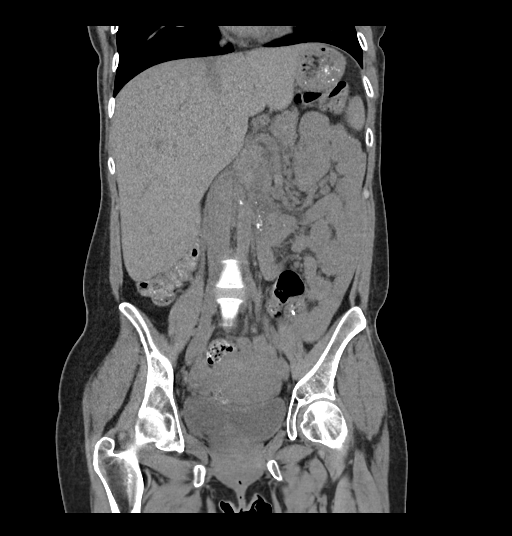

[16 of 46 positions shown; findings below may reference images not displayed]

FINDINGS: Lower chest: Lung bases are clear.

Hepatobiliary: Liver measures 22.3 cm in length. No focal liver
lesions are appreciable on this noncontrast enhanced study.
Gallbladder wall is not appreciably thickened. There is no evident
biliary duct dilatation.

Pancreas: There is no appreciable pancreatic mass or inflammatory
focus.

Spleen: No splenic lesions are evident.

Adrenals/Urinary Tract: Adrenals bilaterally appear unremarkable.
There is no renal mass on either side. There is mild hydronephrosis
on the right. There is no appreciable hydronephrosis on the left.
There is no intrarenal calculus on either side. There is no
appreciable ureteral calculus on either side. Urinary bladder is
midline with wall thickness within normal limits.

Stomach/Bowel: There is no appreciable bowel wall or mesenteric
thickening. There is no evident bowel obstruction. There is no free
air or portal venous air.

Vascular/Lymphatic: No abdominal aortic aneurysm. There are
scattered foci atherosclerotic calcification in the aorta. No
adenopathy is evident in the abdomen or pelvis.

Reproductive: The uterus is anteverted. There is no evident pelvic
mass.

Other: Appendix appears normal. No abscess or ascites is evident in
the abdomen or pelvis.

Musculoskeletal: No blastic or lytic bone lesions. Slight endplate
concavity along the superior T11 vertebral body. No intramuscular or
abdominal wall lesion.
IMPRESSION: 1. Slight hydronephrosis on the right without intrarenal or ureteral
calculus evident on the right. Question recent calculus passage. It
should be noted that pyelonephritis may present in this manner.
There is no perinephric stranding or suggestion of abscess in the
right kidney on this noncontrast enhanced study.

2. Appendix appears normal. No bowel obstruction. No abscess in the
abdomen or pelvis.

3.  Prominent liver without focal lesion demonstrable.

4.  Foci of aortic atherosclerosis.

## 2020-03-18 DIAGNOSIS — Z419 Encounter for procedure for purposes other than remedying health state, unspecified: Secondary | ICD-10-CM | POA: Diagnosis not present

## 2020-04-18 DIAGNOSIS — Z419 Encounter for procedure for purposes other than remedying health state, unspecified: Secondary | ICD-10-CM | POA: Diagnosis not present

## 2020-04-29 DIAGNOSIS — Z89511 Acquired absence of right leg below knee: Secondary | ICD-10-CM | POA: Diagnosis not present

## 2020-05-19 DIAGNOSIS — Z419 Encounter for procedure for purposes other than remedying health state, unspecified: Secondary | ICD-10-CM | POA: Diagnosis not present

## 2020-06-18 DIAGNOSIS — Z419 Encounter for procedure for purposes other than remedying health state, unspecified: Secondary | ICD-10-CM | POA: Diagnosis not present

## 2020-07-19 DIAGNOSIS — Z419 Encounter for procedure for purposes other than remedying health state, unspecified: Secondary | ICD-10-CM | POA: Diagnosis not present

## 2020-07-30 ENCOUNTER — Other Ambulatory Visit: Payer: Self-pay | Admitting: *Deleted

## 2020-08-18 DIAGNOSIS — Z419 Encounter for procedure for purposes other than remedying health state, unspecified: Secondary | ICD-10-CM | POA: Diagnosis not present

## 2020-09-18 DIAGNOSIS — Z419 Encounter for procedure for purposes other than remedying health state, unspecified: Secondary | ICD-10-CM | POA: Diagnosis not present

## 2020-10-19 DIAGNOSIS — Z419 Encounter for procedure for purposes other than remedying health state, unspecified: Secondary | ICD-10-CM | POA: Diagnosis not present

## 2020-11-16 DIAGNOSIS — Z419 Encounter for procedure for purposes other than remedying health state, unspecified: Secondary | ICD-10-CM | POA: Diagnosis not present

## 2020-12-17 DIAGNOSIS — Z419 Encounter for procedure for purposes other than remedying health state, unspecified: Secondary | ICD-10-CM | POA: Diagnosis not present

## 2021-01-16 DIAGNOSIS — Z419 Encounter for procedure for purposes other than remedying health state, unspecified: Secondary | ICD-10-CM | POA: Diagnosis not present

## 2021-02-16 DIAGNOSIS — Z419 Encounter for procedure for purposes other than remedying health state, unspecified: Secondary | ICD-10-CM | POA: Diagnosis not present

## 2021-03-18 ENCOUNTER — Ambulatory Visit: Payer: Medicaid Other | Attending: Nurse Practitioner

## 2021-03-18 DIAGNOSIS — R293 Abnormal posture: Secondary | ICD-10-CM | POA: Insufficient documentation

## 2021-03-18 DIAGNOSIS — M6281 Muscle weakness (generalized): Secondary | ICD-10-CM | POA: Diagnosis not present

## 2021-03-18 DIAGNOSIS — R2689 Other abnormalities of gait and mobility: Secondary | ICD-10-CM

## 2021-03-18 DIAGNOSIS — R2681 Unsteadiness on feet: Secondary | ICD-10-CM | POA: Insufficient documentation

## 2021-03-18 DIAGNOSIS — Z9181 History of falling: Secondary | ICD-10-CM | POA: Insufficient documentation

## 2021-03-18 DIAGNOSIS — Z419 Encounter for procedure for purposes other than remedying health state, unspecified: Secondary | ICD-10-CM | POA: Diagnosis not present
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
## 2021-03-22 DIAGNOSIS — Z01812 Encounter for preprocedural laboratory examination: Secondary | ICD-10-CM | POA: Diagnosis not present

## 2021-03-25 NOTE — Addendum Note (Signed)
Addended by: Ileana Ladd on: 03/25/2021 02:30 PM   Modules accepted: Orders

## 2021-03-29 ENCOUNTER — Ambulatory Visit: Payer: Medicaid Other

## 2021-04-04 ENCOUNTER — Ambulatory Visit: Payer: Medicaid Other

## 2021-04-04 ENCOUNTER — Other Ambulatory Visit: Payer: Self-pay

## 2021-04-04 DIAGNOSIS — M6281 Muscle weakness (generalized): Secondary | ICD-10-CM

## 2021-04-04 DIAGNOSIS — R2689 Other abnormalities of gait and mobility: Secondary | ICD-10-CM

## 2021-04-04 DIAGNOSIS — R2681 Unsteadiness on feet: Secondary | ICD-10-CM | POA: Diagnosis not present

## 2021-04-04 DIAGNOSIS — Z9181 History of falling: Secondary | ICD-10-CM | POA: Diagnosis not present

## 2021-04-04 DIAGNOSIS — R293 Abnormal posture: Secondary | ICD-10-CM | POA: Diagnosis not present
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
## 2021-04-18 DIAGNOSIS — Z419 Encounter for procedure for purposes other than remedying health state, unspecified: Secondary | ICD-10-CM | POA: Diagnosis not present

## 2021-04-27 DIAGNOSIS — E10649 Type 1 diabetes mellitus with hypoglycemia without coma: Secondary | ICD-10-CM | POA: Diagnosis not present

## 2021-05-19 DIAGNOSIS — Z419 Encounter for procedure for purposes other than remedying health state, unspecified: Secondary | ICD-10-CM | POA: Diagnosis not present

## 2021-06-18 DIAGNOSIS — Z419 Encounter for procedure for purposes other than remedying health state, unspecified: Secondary | ICD-10-CM | POA: Diagnosis not present

## 2021-07-17 DIAGNOSIS — H5213 Myopia, bilateral: Secondary | ICD-10-CM | POA: Diagnosis not present

## 2021-07-19 DIAGNOSIS — Z419 Encounter for procedure for purposes other than remedying health state, unspecified: Secondary | ICD-10-CM | POA: Diagnosis not present

## 2021-07-19 DIAGNOSIS — Z20828 Contact with and (suspected) exposure to other viral communicable diseases: Secondary | ICD-10-CM | POA: Diagnosis not present

## 2021-07-19 DIAGNOSIS — J029 Acute pharyngitis, unspecified: Secondary | ICD-10-CM | POA: Diagnosis not present

## 2021-08-18 DIAGNOSIS — Z419 Encounter for procedure for purposes other than remedying health state, unspecified: Secondary | ICD-10-CM | POA: Diagnosis not present

## 2021-08-30 DIAGNOSIS — H4311 Vitreous hemorrhage, right eye: Secondary | ICD-10-CM | POA: Diagnosis not present

## 2021-08-30 DIAGNOSIS — E1039 Type 1 diabetes mellitus with other diabetic ophthalmic complication: Secondary | ICD-10-CM | POA: Diagnosis not present

## 2021-09-07 DIAGNOSIS — H26493 Other secondary cataract, bilateral: Secondary | ICD-10-CM | POA: Diagnosis not present

## 2021-09-07 DIAGNOSIS — E103593 Type 1 diabetes mellitus with proliferative diabetic retinopathy without macular edema, bilateral: Secondary | ICD-10-CM | POA: Diagnosis not present

## 2021-09-07 DIAGNOSIS — H35371 Puckering of macula, right eye: Secondary | ICD-10-CM | POA: Diagnosis not present

## 2021-09-07 DIAGNOSIS — H3582 Retinal ischemia: Secondary | ICD-10-CM | POA: Diagnosis not present

## 2021-09-16 DIAGNOSIS — R300 Dysuria: Secondary | ICD-10-CM | POA: Diagnosis not present

## 2021-09-16 DIAGNOSIS — Z6824 Body mass index (BMI) 24.0-24.9, adult: Secondary | ICD-10-CM | POA: Diagnosis not present

## 2021-09-16 DIAGNOSIS — A64 Unspecified sexually transmitted disease: Secondary | ICD-10-CM | POA: Diagnosis not present

## 2021-09-18 DIAGNOSIS — Z419 Encounter for procedure for purposes other than remedying health state, unspecified: Secondary | ICD-10-CM | POA: Diagnosis not present

## 2021-09-21 DIAGNOSIS — B9620 Unspecified Escherichia coli [E. coli] as the cause of diseases classified elsewhere: Secondary | ICD-10-CM | POA: Diagnosis not present

## 2021-09-21 DIAGNOSIS — Z131 Encounter for screening for diabetes mellitus: Secondary | ICD-10-CM | POA: Diagnosis not present

## 2021-09-21 DIAGNOSIS — N390 Urinary tract infection, site not specified: Secondary | ICD-10-CM | POA: Diagnosis not present

## 2021-10-19 DIAGNOSIS — Z419 Encounter for procedure for purposes other than remedying health state, unspecified: Secondary | ICD-10-CM | POA: Diagnosis not present

## 2021-10-28 DIAGNOSIS — H524 Presbyopia: Secondary | ICD-10-CM | POA: Diagnosis not present

## 2021-10-28 DIAGNOSIS — H04123 Dry eye syndrome of bilateral lacrimal glands: Secondary | ICD-10-CM | POA: Diagnosis not present

## 2021-10-28 DIAGNOSIS — E113593 Type 2 diabetes mellitus with proliferative diabetic retinopathy without macular edema, bilateral: Secondary | ICD-10-CM | POA: Diagnosis not present

## 2021-10-28 DIAGNOSIS — H26492 Other secondary cataract, left eye: Secondary | ICD-10-CM | POA: Diagnosis not present

## 2021-11-16 DIAGNOSIS — Z419 Encounter for procedure for purposes other than remedying health state, unspecified: Secondary | ICD-10-CM | POA: Diagnosis not present

## 2021-11-22 DIAGNOSIS — H26492 Other secondary cataract, left eye: Secondary | ICD-10-CM | POA: Diagnosis not present

## 2021-12-05 DIAGNOSIS — Z79899 Other long term (current) drug therapy: Secondary | ICD-10-CM | POA: Diagnosis not present

## 2021-12-05 DIAGNOSIS — E10649 Type 1 diabetes mellitus with hypoglycemia without coma: Secondary | ICD-10-CM | POA: Diagnosis not present

## 2021-12-05 DIAGNOSIS — E559 Vitamin D deficiency, unspecified: Secondary | ICD-10-CM | POA: Diagnosis not present

## 2021-12-17 DIAGNOSIS — Z419 Encounter for procedure for purposes other than remedying health state, unspecified: Secondary | ICD-10-CM | POA: Diagnosis not present

## 2022-01-02 DIAGNOSIS — E1039 Type 1 diabetes mellitus with other diabetic ophthalmic complication: Secondary | ICD-10-CM | POA: Diagnosis not present

## 2022-01-02 DIAGNOSIS — H4311 Vitreous hemorrhage, right eye: Secondary | ICD-10-CM | POA: Diagnosis not present

## 2022-01-02 DIAGNOSIS — E10649 Type 1 diabetes mellitus with hypoglycemia without coma: Secondary | ICD-10-CM | POA: Diagnosis not present

## 2022-01-02 DIAGNOSIS — E103593 Type 1 diabetes mellitus with proliferative diabetic retinopathy without macular edema, bilateral: Secondary | ICD-10-CM | POA: Diagnosis not present

## 2022-01-05 DIAGNOSIS — Z1231 Encounter for screening mammogram for malignant neoplasm of breast: Secondary | ICD-10-CM | POA: Diagnosis not present

## 2022-01-16 DIAGNOSIS — Z419 Encounter for procedure for purposes other than remedying health state, unspecified: Secondary | ICD-10-CM | POA: Diagnosis not present

## 2022-01-30 DIAGNOSIS — Z89511 Acquired absence of right leg below knee: Secondary | ICD-10-CM | POA: Diagnosis not present

## 2022-02-03 DIAGNOSIS — E10649 Type 1 diabetes mellitus with hypoglycemia without coma: Secondary | ICD-10-CM | POA: Diagnosis not present

## 2022-02-03 DIAGNOSIS — Z0000 Encounter for general adult medical examination without abnormal findings: Secondary | ICD-10-CM | POA: Diagnosis not present

## 2022-02-16 DIAGNOSIS — Z419 Encounter for procedure for purposes other than remedying health state, unspecified: Secondary | ICD-10-CM | POA: Diagnosis not present

## 2022-03-18 DIAGNOSIS — Z419 Encounter for procedure for purposes other than remedying health state, unspecified: Secondary | ICD-10-CM | POA: Diagnosis not present

## 2022-04-07 DIAGNOSIS — E10319 Type 1 diabetes mellitus with unspecified diabetic retinopathy without macular edema: Secondary | ICD-10-CM | POA: Diagnosis not present

## 2022-04-07 DIAGNOSIS — E10649 Type 1 diabetes mellitus with hypoglycemia without coma: Secondary | ICD-10-CM | POA: Diagnosis not present

## 2022-04-18 DIAGNOSIS — Z419 Encounter for procedure for purposes other than remedying health state, unspecified: Secondary | ICD-10-CM | POA: Diagnosis not present

## 2022-05-19 DIAGNOSIS — Z419 Encounter for procedure for purposes other than remedying health state, unspecified: Secondary | ICD-10-CM | POA: Diagnosis not present

## 2022-05-24 DIAGNOSIS — G4710 Hypersomnia, unspecified: Secondary | ICD-10-CM | POA: Diagnosis not present

## 2022-05-29 DIAGNOSIS — Z975 Presence of (intrauterine) contraceptive device: Secondary | ICD-10-CM | POA: Diagnosis not present

## 2022-05-29 DIAGNOSIS — Z0000 Encounter for general adult medical examination without abnormal findings: Secondary | ICD-10-CM | POA: Diagnosis not present

## 2022-05-31 DIAGNOSIS — R55 Syncope and collapse: Secondary | ICD-10-CM | POA: Diagnosis not present

## 2022-05-31 DIAGNOSIS — Z743 Need for continuous supervision: Secondary | ICD-10-CM | POA: Diagnosis not present

## 2022-05-31 DIAGNOSIS — E162 Hypoglycemia, unspecified: Secondary | ICD-10-CM | POA: Diagnosis not present

## 2022-05-31 DIAGNOSIS — R6889 Other general symptoms and signs: Secondary | ICD-10-CM | POA: Diagnosis not present

## 2022-05-31 DIAGNOSIS — R404 Transient alteration of awareness: Secondary | ICD-10-CM | POA: Diagnosis not present

## 2022-06-18 DIAGNOSIS — Z419 Encounter for procedure for purposes other than remedying health state, unspecified: Secondary | ICD-10-CM | POA: Diagnosis not present

## 2022-07-04 DIAGNOSIS — G4710 Hypersomnia, unspecified: Secondary | ICD-10-CM | POA: Diagnosis not present

## 2022-07-04 DIAGNOSIS — Z111 Encounter for screening for respiratory tuberculosis: Secondary | ICD-10-CM | POA: Diagnosis not present

## 2022-07-04 DIAGNOSIS — E10319 Type 1 diabetes mellitus with unspecified diabetic retinopathy without macular edema: Secondary | ICD-10-CM | POA: Diagnosis not present

## 2022-07-04 DIAGNOSIS — G4733 Obstructive sleep apnea (adult) (pediatric): Secondary | ICD-10-CM | POA: Diagnosis not present

## 2022-07-07 DIAGNOSIS — G4733 Obstructive sleep apnea (adult) (pediatric): Secondary | ICD-10-CM | POA: Diagnosis not present

## 2022-07-19 DIAGNOSIS — Z419 Encounter for procedure for purposes other than remedying health state, unspecified: Secondary | ICD-10-CM | POA: Diagnosis not present

## 2022-08-18 DIAGNOSIS — Z419 Encounter for procedure for purposes other than remedying health state, unspecified: Secondary | ICD-10-CM | POA: Diagnosis not present

## 2022-08-24 DIAGNOSIS — R051 Acute cough: Secondary | ICD-10-CM | POA: Diagnosis not present

## 2022-08-24 DIAGNOSIS — J029 Acute pharyngitis, unspecified: Secondary | ICD-10-CM | POA: Diagnosis not present

## 2022-08-24 DIAGNOSIS — N3091 Cystitis, unspecified with hematuria: Secondary | ICD-10-CM | POA: Diagnosis not present

## 2022-08-24 DIAGNOSIS — R300 Dysuria: Secondary | ICD-10-CM | POA: Diagnosis not present

## 2022-09-01 DIAGNOSIS — E162 Hypoglycemia, unspecified: Secondary | ICD-10-CM | POA: Diagnosis not present

## 2022-09-01 DIAGNOSIS — Z743 Need for continuous supervision: Secondary | ICD-10-CM | POA: Diagnosis not present

## 2022-09-01 DIAGNOSIS — R404 Transient alteration of awareness: Secondary | ICD-10-CM | POA: Diagnosis not present

## 2022-09-01 DIAGNOSIS — R61 Generalized hyperhidrosis: Secondary | ICD-10-CM | POA: Diagnosis not present

## 2022-09-01 DIAGNOSIS — R6889 Other general symptoms and signs: Secondary | ICD-10-CM | POA: Diagnosis not present

## 2022-09-18 DIAGNOSIS — Z419 Encounter for procedure for purposes other than remedying health state, unspecified: Secondary | ICD-10-CM | POA: Diagnosis not present

## 2022-09-20 DIAGNOSIS — E10649 Type 1 diabetes mellitus with hypoglycemia without coma: Secondary | ICD-10-CM | POA: Diagnosis not present

## 2022-10-07 DIAGNOSIS — R61 Generalized hyperhidrosis: Secondary | ICD-10-CM | POA: Diagnosis not present

## 2022-10-07 DIAGNOSIS — R404 Transient alteration of awareness: Secondary | ICD-10-CM | POA: Diagnosis not present

## 2022-10-07 DIAGNOSIS — Z743 Need for continuous supervision: Secondary | ICD-10-CM | POA: Diagnosis not present

## 2022-10-07 DIAGNOSIS — E161 Other hypoglycemia: Secondary | ICD-10-CM | POA: Diagnosis not present

## 2022-10-08 ENCOUNTER — Emergency Department (HOSPITAL_COMMUNITY)
Admission: EM | Admit: 2022-10-08 | Discharge: 2022-10-08 | Disposition: A | Discharge status: Home / Self Care | Payer: BC Managed Care – PPO | Attending: Emergency Medicine | Admitting: Emergency Medicine

## 2022-10-08 ENCOUNTER — Encounter (HOSPITAL_COMMUNITY): Payer: Self-pay | Admitting: Emergency Medicine

## 2022-10-08 ENCOUNTER — Other Ambulatory Visit: Payer: Self-pay

## 2022-10-08 DIAGNOSIS — E162 Hypoglycemia, unspecified: Secondary | ICD-10-CM | POA: Diagnosis present

## 2022-10-08 DIAGNOSIS — Z794 Long term (current) use of insulin: Secondary | ICD-10-CM | POA: Insufficient documentation

## 2022-10-08 DIAGNOSIS — R9431 Abnormal electrocardiogram [ECG] [EKG]: Secondary | ICD-10-CM | POA: Diagnosis not present

## 2022-10-08 DIAGNOSIS — E11649 Type 2 diabetes mellitus with hypoglycemia without coma: Secondary | ICD-10-CM | POA: Diagnosis not present

## 2022-10-08 LAB — COMPREHENSIVE METABOLIC PANEL
ALT: 28 U/L (ref 0–44)
AST: 25 U/L (ref 15–41)
Albumin: 3.4 g/dL — ABNORMAL LOW (ref 3.5–5.0)
Alkaline Phosphatase: 82 U/L (ref 38–126)
Anion gap: 8 (ref 5–15)
BUN: 16 mg/dL (ref 6–20)
CO2: 27 mmol/L (ref 22–32)
Calcium: 8.5 mg/dL — ABNORMAL LOW (ref 8.9–10.3)
Chloride: 101 mmol/L (ref 98–111)
Creatinine, Ser: 1.04 mg/dL — ABNORMAL HIGH (ref 0.44–1.00)
GFR, Estimated: >60 mL/min (ref >60)
Glucose, Bld: 122 mg/dL — ABNORMAL HIGH (ref 70–99)
Potassium: 4.1 mmol/L (ref 3.5–5.1)
Sodium: 136 mmol/L (ref 135–145)
Total Bilirubin: 0.3 mg/dL (ref 0.3–1.2)
Total Protein: 7.2 g/dL (ref 6.5–8.1)

## 2022-10-08 LAB — CBC
HCT: 35.9 % — ABNORMAL LOW (ref 36.0–46.0)
Hemoglobin: 11.7 g/dL — ABNORMAL LOW (ref 12.0–15.0)
MCH: 29.5 pg (ref 26.0–34.0)
MCHC: 32.6 g/dL (ref 30.0–36.0)
MCV: 90.7 fL (ref 80.0–100.0)
Platelets: 272 10*3/uL (ref 150–400)
RBC: 3.96 MIL/uL (ref 3.87–5.11)
RDW: 13.5 % (ref 11.5–15.5)
WBC: 11 10*3/uL — ABNORMAL HIGH (ref 4.0–10.5)
nRBC: 0 % (ref 0.0–0.2)

## 2022-10-08 LAB — CBG MONITORING, ED
Glucose-Capillary: 122 mg/dL — ABNORMAL HIGH (ref 70–99)
Glucose-Capillary: 169 mg/dL — ABNORMAL HIGH (ref 70–99)

## 2022-10-08 NOTE — ED Triage Notes (Signed)
Pt called EMS after her Dexacom was reading low x 1 hr. Pt had 1 glucose tab, 2 peanut butter sandwiches, and 1.5 cans of Mt. Dew prior to EMS arrival. EMS checked blood glucose on scene at got a reading of 53. Pt was given 270ml D10 by EMS. EMS rechecked blood sugar PTA and got reading of 311. Upon arrival to ED, blood glucose checked by [place] was 122. Pt alert and oriented x 4. Denies any other symptoms.

## 2022-10-08 NOTE — ED Provider Notes (Signed)
[facility] Provider Note   CSN: [id number] Arrival date & time: 10/08/22  0000     History  Chief Complaint  Patient presents with   Hypoglycemia    [name] is a 42 y.o. female.  Patient presents to the emergency department for evaluation of low blood sugar.  Patient reports that her sugars do fluctuate and she sometimes has lows like this.  Tonight, however, she ate some peanut butter and jelly and drank [facility] but the blood sugar stayed low so she came to the ED.  EMS gave her D10 and after recheck it was 311.       Home Medications Prior to Admission medications   Medication Sig Start Date End Date Taking? Authorizing Provider  ARIPiprazole (ABILIFY) 5 MG tablet Take 1 tablet (5 mg total) by mouth daily. 05/21/19   Terald Sleeper, PA-C  cetirizine (ZYRTEC) 10 MG tablet Take 10 mg by mouth daily.    [provider]  Continuous Blood Gluc Sensor (DEXCOM G6 SENSOR) MISC 1 each by Does not apply route continuous. Please give any other equipment such as reader or transmitter. Patient has smart phone. 05/11/19   Terald Sleeper, PA-C  doxycycline (VIBRA-TABS) 100 MG tablet Take 1 tablet (100 mg total) by mouth 2 (two) times daily. 1 po bid 07/15/19   Terald Sleeper, PA-C  Eluxadoline (VIBERZI) 100 MG TABS Take 1 tablet (100 mg total) by mouth 2 (two) times daily. 05/21/19   Terald Sleeper, PA-C  escitalopram (LEXAPRO) 10 MG tablet Take 1 tablet (10 mg total) by mouth daily. 05/21/19   Terald Sleeper, PA-C  Ferrous Gluconate (IRON 27 PO) Take 1 tablet by mouth daily.     [provider]  Insulin Glargine (LANTUS SOLOSTAR) 100 UNIT/ML Solostar Pen Inject 17-25 Units into the skin daily. 07/29/19   Terald Sleeper, PA-C  Insulin Pen Needle 32G X 4 MM MISC 1 Units by Does not apply route 5 (five) times daily. 05/21/19   Terald Sleeper, PA-C  lansoprazole (PREVACID) 30 MG capsule Take 1 capsule (30 mg total) by mouth  daily at 12 noon. 05/21/19   Terald Sleeper, PA-C      Allergies    Morphine, Codeine, and Sulfa antibiotics    Review of Systems   Review of Systems  Physical Exam Updated Vital Signs BP 111/71   Pulse 88   Temp 98.1 F (36.7 C)   Resp (!) 23   Ht 5\' 4"  (1.626 m)   Wt 63.5 kg   SpO2 100%   BMI 24.03 kg/m  Physical Exam Vitals and nursing note reviewed.  Constitutional:      General: She is not in acute distress.    Appearance: She is well-developed.  HENT:     Head: Normocephalic and atraumatic.     Mouth/Throat:     Mouth: Mucous membranes are moist.  Eyes:     General: Vision grossly intact. Gaze aligned appropriately.     Extraocular Movements: Extraocular movements intact.     Conjunctiva/sclera: Conjunctivae normal.  Cardiovascular:     Rate and Rhythm: Normal rate and regular rhythm.     Pulses: Normal pulses.     Heart sounds: Normal heart sounds, S1 normal and S2 normal. No murmur heard.    No friction rub. No gallop.  Pulmonary:     Effort: Pulmonary effort is normal. No respiratory distress.     Breath sounds:  Normal breath sounds.  Abdominal:     General: Bowel sounds are normal.     Palpations: Abdomen is soft.     Tenderness: There is no abdominal tenderness. There is no guarding or rebound.     Hernia: No hernia is present.  Musculoskeletal:        General: No swelling.     Cervical back: Full passive range of motion without pain, normal range of motion and neck supple. No spinous process tenderness or muscular tenderness. Normal range of motion.     Right lower leg: No edema.     Left lower leg: No edema.  Skin:    General: Skin is warm and dry.     Capillary Refill: Capillary refill takes less than 2 seconds.     Findings: No ecchymosis, erythema, rash or wound.  Neurological:     General: No focal deficit present.     Mental Status: She is alert and oriented to person, place, and time.     GCS: GCS eye subscore is 4. GCS verbal subscore is 5.  GCS motor subscore is 6.     Cranial Nerves: Cranial nerves 2-12 are intact.     Sensory: Sensation is intact.     Motor: Motor function is intact.     Coordination: Coordination is intact.  Psychiatric:        Attention and Perception: Attention normal.        Mood and Affect: Mood normal.        Speech: Speech normal.        Behavior: Behavior normal.     ED Results / Procedures / Treatments   Labs (all labs ordered are listed, but only abnormal results are displayed) Labs Reviewed  CBC - Abnormal; Notable for the following components:      Result Value   WBC 11.0 (*)    Hemoglobin 11.7 (*)    HCT 35.9 (*)    All other components within normal limits  COMPREHENSIVE METABOLIC PANEL - Abnormal; Notable for the following components:   Glucose, Bld 122 (*)    Creatinine, Ser 1.04 (*)    Calcium 8.5 (*)    Albumin 3.4 (*)    All other components within normal limits  CBG MONITORING, ED - Abnormal; Notable for the following components:   Glucose-Capillary 122 (*)    All other components within normal limits  CBG MONITORING, ED - Abnormal; Notable for the following components:   Glucose-Capillary 169 (*)    All other components within normal limits    EKG EKG Interpretation  Date/Time:  Sunday October 08 2022 00:10:56 EST Ventricular Rate:  82 PR Interval:  172 QRS Duration: 86 QT Interval:  381 QTC Calculation: 445 R Axis:   22 Text Interpretation: Sinus rhythm Normal ECG Confirmed by Orpah Greek [id number]) on 10/08/2022 12:13:10 AM  Radiology No results found.  Procedures Procedures    Medications Ordered in ED Medications - No data to display  ED Course/ Medical Decision Making/ A&P                             Medical Decision Making  Patient presents to the emergency department for evaluation of hypoglycemia.  Patient is diabetic and does have fluctuations in her sugars.  She has not been ill recently, no obvious triggers for hypoglycemia.  She  did eat a fair amount of calories before coming to the ED and blood  sugar has maintained above 100.  She has done well, no symptoms.  Patient will be appropriate for discharge.        Final Clinical Impression(s) / ED Diagnoses Final diagnoses:  Hypoglycemia    Rx / DC Orders ED Discharge Orders     None         Revonda Menter, Canary Brim, MD 10/08/22 [phone]

## 2022-10-08 NOTE — ED Notes (Signed)
Pt requesting snack-given crackers and a drink

## 2022-10-19 DIAGNOSIS — Z419 Encounter for procedure for purposes other than remedying health state, unspecified: Secondary | ICD-10-CM | POA: Diagnosis not present

## 2022-11-10 DIAGNOSIS — J029 Acute pharyngitis, unspecified: Secondary | ICD-10-CM | POA: Diagnosis not present

## 2022-11-10 DIAGNOSIS — R197 Diarrhea, unspecified: Secondary | ICD-10-CM | POA: Diagnosis not present

## 2022-11-10 DIAGNOSIS — R0981 Nasal congestion: Secondary | ICD-10-CM | POA: Diagnosis not present

## 2022-11-10 DIAGNOSIS — R590 Localized enlarged lymph nodes: Secondary | ICD-10-CM | POA: Diagnosis not present

## 2022-11-17 DIAGNOSIS — Z419 Encounter for procedure for purposes other than remedying health state, unspecified: Secondary | ICD-10-CM | POA: Diagnosis not present

## 2022-12-18 DIAGNOSIS — Z419 Encounter for procedure for purposes other than remedying health state, unspecified: Secondary | ICD-10-CM | POA: Diagnosis not present

## 2023-01-09 DIAGNOSIS — Z79899 Other long term (current) drug therapy: Secondary | ICD-10-CM | POA: Diagnosis not present

## 2023-01-17 DIAGNOSIS — Z419 Encounter for procedure for purposes other than remedying health state, unspecified: Secondary | ICD-10-CM | POA: Diagnosis not present

## 2023-02-17 DIAGNOSIS — Z419 Encounter for procedure for purposes other than remedying health state, unspecified: Secondary | ICD-10-CM | POA: Diagnosis not present

## 2023-02-18 ENCOUNTER — Other Ambulatory Visit: Payer: Self-pay

## 2023-02-18 ENCOUNTER — Emergency Department (HOSPITAL_BASED_OUTPATIENT_CLINIC_OR_DEPARTMENT_OTHER)
Admission: EM | Admit: 2023-02-18 | Discharge: 2023-02-18 | Disposition: A | Discharge status: Home / Self Care | Payer: BC Managed Care – PPO | Attending: Emergency Medicine | Admitting: Emergency Medicine

## 2023-02-18 ENCOUNTER — Encounter (HOSPITAL_BASED_OUTPATIENT_CLINIC_OR_DEPARTMENT_OTHER): Payer: Self-pay

## 2023-02-18 DIAGNOSIS — E1065 Type 1 diabetes mellitus with hyperglycemia: Secondary | ICD-10-CM | POA: Diagnosis not present

## 2023-02-18 DIAGNOSIS — Z794 Long term (current) use of insulin: Secondary | ICD-10-CM | POA: Insufficient documentation

## 2023-02-18 DIAGNOSIS — R739 Hyperglycemia, unspecified: Secondary | ICD-10-CM

## 2023-02-18 LAB — URINALYSIS, ROUTINE W REFLEX MICROSCOPIC
Bacteria, UA: NONE SEEN
Bilirubin Urine: NEGATIVE
Glucose, UA: >1000 mg/dL — AB
Hgb urine dipstick: NEGATIVE
Ketones, ur: NEGATIVE mg/dL
Leukocytes,Ua: NEGATIVE
Nitrite: NEGATIVE
Protein, ur: NEGATIVE mg/dL
Specific Gravity, Urine: 1.031 — ABNORMAL HIGH (ref 1.005–1.030)
pH: 6.5 (ref 5.0–8.0)

## 2023-02-18 LAB — CBC
HCT: 32.7 % — ABNORMAL LOW (ref 36.0–46.0)
Hemoglobin: 11.2 g/dL — ABNORMAL LOW (ref 12.0–15.0)
MCH: 29.9 pg (ref 26.0–34.0)
MCHC: 34.3 g/dL (ref 30.0–36.0)
MCV: 87.4 fL (ref 80.0–100.0)
Platelets: 352 10*3/uL (ref 150–400)
RBC: 3.74 MIL/uL — ABNORMAL LOW (ref 3.87–5.11)
RDW: 13.2 % (ref 11.5–15.5)
WBC: 18 10*3/uL — ABNORMAL HIGH (ref 4.0–10.5)
nRBC: 0 % (ref 0.0–0.2)

## 2023-02-18 LAB — CBG MONITORING, ED
Glucose-Capillary: 502 mg/dL (ref 70–99)
Glucose-Capillary: 359 mg/dL — ABNORMAL HIGH (ref 70–99)

## 2023-02-18 LAB — BASIC METABOLIC PANEL
Anion gap: 9 (ref 5–15)
BUN: 16 mg/dL (ref 6–20)
CO2: 23 mmol/L (ref 22–32)
Calcium: 8.9 mg/dL (ref 8.9–10.3)
Chloride: 101 mmol/L (ref 98–111)
Creatinine, Ser: 1.09 mg/dL — ABNORMAL HIGH (ref 0.44–1.00)
GFR, Estimated: >60 mL/min (ref >60)
Glucose, Bld: 524 mg/dL (ref 70–99)
Potassium: 3.9 mmol/L (ref 3.5–5.1)
Sodium: 133 mmol/L — ABNORMAL LOW (ref 135–145)

## 2023-02-18 LAB — PREGNANCY, URINE: Preg Test, Ur: NEGATIVE

## 2023-02-18 MED ORDER — SODIUM CHLORIDE 0.9 % IV BOLUS
1000.0000 mL | Freq: Once | INTRAVENOUS | Status: AC
  Administered 2023-02-18: 1000 mL via INTRAVENOUS

## 2023-02-18 MED ORDER — INSULIN ASPART 100 UNIT/ML IV SOLN
10.0000 [IU] | Freq: Once | INTRAVENOUS | Status: AC
  Administered 2023-02-18: 10 [IU] via INTRAVENOUS

## 2023-02-18 NOTE — ED Notes (Signed)
Pt started taking prednisone yesterday and unable to get blood sugar below 500's. Water given and fluid started. No c/o pain. Vss, a/ox4.

## 2023-02-18 NOTE — ED Provider Notes (Signed)
[place] EMERGENCY DEPARTMENT AT [facility] Provider Note   CSN: [id number] Arrival date & time: 02/18/23  1741     History  Chief Complaint  Patient presents with   Hyperglycemia    [name] is a 42 y.o. female.  42 yo F with a chief complaints of hyperglycemia.  The patient is a type I diabetic and was seen at urgent care and was started on prednisone for sinusitis.  She has taken a couple days worth and realized that she was having a lot of trouble controlling her blood sugar at home.  Was going through a lot more insulin than she typically went through.  She feels a little short of breath.  Denies any nausea or vomiting denies abdominal pain.  Has had some cough and congestion with her sinus symptoms.  Does not feel like that significantly changed.   Hyperglycemia      Home Medications Prior to Admission medications   Medication Sig Start Date End Date Taking? Authorizing Provider  amoxicillin-clavulanate (AUGMENTIN) 875-125 MG tablet Take 1 tablet by mouth 2 (two) times daily. 02/17/23  Yes [provider]  injection device for insulin (INPEN 100-PINK-NOVOLOG-FIASP) DEVI by Other route in the morning, at noon, and at bedtime. 9 units at breakfast 9 at lunch  5 units at supper 3 units for snacks 09/07/21  Yes [provider]  insulin degludec (TRESIBA) 100 UNIT/ML FlexTouch Pen Inject 20 Units into the skin daily. Does 26 units at night while on prednisone 02/06/23  Yes [provider]  predniSONE (STERAPRED UNI-PAK 21 TAB) 10 MG (21) TBPK tablet Take 10 mg by mouth daily. Steroid taper pack 02/17/23  Yes [provider]  ARIPiprazole (ABILIFY) 5 MG tablet Take 1 tablet (5 mg total) by mouth daily. 05/21/19   Remus Loffler, PA-C  cetirizine (ZYRTEC) 10 MG tablet Take 10 mg by mouth daily.    [provider]  Continuous Blood Gluc Sensor (DEXCOM G6 SENSOR) MISC 1 each by Does not apply route continuous. Please  give any other equipment such as reader or transmitter. Patient has smart phone. 05/11/19   Remus Loffler, PA-C  doxycycline (VIBRA-TABS) 100 MG tablet Take 1 tablet (100 mg total) by mouth 2 (two) times daily. 1 po bid 07/15/19   Remus Loffler, PA-C  Eluxadoline (VIBERZI) 100 MG TABS Take 1 tablet (100 mg total) by mouth 2 (two) times daily. 05/21/19   Remus Loffler, PA-C  escitalopram (LEXAPRO) 10 MG tablet Take 1 tablet (10 mg total) by mouth daily. 05/21/19   Remus Loffler, PA-C  Ferrous Gluconate (IRON 27 PO) Take 1 tablet by mouth daily.     [provider]  Insulin Glargine (LANTUS SOLOSTAR) 100 UNIT/ML Solostar Pen Inject 17-25 Units into the skin daily. 07/29/19   Remus Loffler, PA-C  Insulin Pen Needle 32G X 4 MM MISC 1 Units by Does not apply route 5 (five) times daily. 05/21/19   Remus Loffler, PA-C  lansoprazole (PREVACID) 30 MG capsule Take 1 capsule (30 mg total) by mouth daily at 12 noon. 05/21/19   Remus Loffler, PA-C      Allergies    Morphine, Codeine, and Sulfa antibiotics    Review of Systems   Review of Systems  Physical Exam Updated Vital Signs BP (!) 157/97   Pulse 81   Temp (!) 97.1 F (36.2 C)   Resp 18   Ht 5\' 4"  (1.626 m)   Wt 72.6  kg   SpO2 100%   BMI 27.46 kg/m  Physical Exam Vitals and nursing note reviewed.  Constitutional:      General: She is not in acute distress.    Appearance: She is well-developed. She is not diaphoretic.  HENT:     Head: Normocephalic and atraumatic.  Eyes:     Pupils: Pupils are equal, round, and reactive to light.  Cardiovascular:     Rate and Rhythm: Normal rate and regular rhythm.     Heart sounds: Normal heart sounds. No murmur heard.    No friction rub. No gallop.  Pulmonary:     Effort: Pulmonary effort is normal.     Breath sounds: No wheezing or rales.  Abdominal:     General: There is no distension.     Palpations: Abdomen is soft.     Tenderness: There is no abdominal tenderness.   Musculoskeletal:        General: No tenderness.     Cervical back: Normal range of motion and neck supple.  Skin:    General: Skin is warm and dry.  Neurological:     Mental Status: She is alert and oriented to person, place, and time.  Psychiatric:        Behavior: Behavior normal.     ED Results / Procedures / Treatments   Labs (all labs ordered are listed, but only abnormal results are displayed) Labs Reviewed  BASIC METABOLIC PANEL - Abnormal; Notable for the following components:      Result Value   Sodium 133 (*)    Glucose, Bld 524 (*)    Creatinine, Ser 1.09 (*)    All other components within normal limits  CBC - Abnormal; Notable for the following components:   WBC 18.0 (*)    RBC 3.74 (*)    Hemoglobin 11.2 (*)    HCT 32.7 (*)    All other components within normal limits  URINALYSIS, ROUTINE W REFLEX MICROSCOPIC - Abnormal; Notable for the following components:   Specific Gravity, Urine 1.031 (*)    Glucose, UA >1,000 (*)    All other components within normal limits  CBG MONITORING, ED - Abnormal; Notable for the following components:   Glucose-Capillary 502 (*)    All other components within normal limits  CBG MONITORING, ED - Abnormal; Notable for the following components:   Glucose-Capillary 359 (*)    All other components within normal limits  PREGNANCY, URINE    EKG None  Radiology No results found.  Procedures Procedures    Medications Ordered in ED Medications  insulin aspart (novoLOG) injection 10 Units (has no administration in time range)  sodium chloride 0.9 % bolus 1,000 mL (1,000 mLs Intravenous New Bag/Given 02/18/23 1834)    ED Course/ Medical Decision Making/ A&P                             Medical Decision Making Amount and/or Complexity of Data Reviewed Labs: ordered.  Risk OTC drugs.   42 yo F with a chief complaints of hyperglycemia.  Patient is a type I diabetic that was started on prednisone for sinusitis.  Blood  sugar here is greater than 500.  Will give a bolus of IV fluids likely bolus dose of insulin.  No acidosis no anion gap elevation.  I think her symptoms are likely due to her starting steroids.  I discussed risk and benefits of continuing and I think stopping  her prednisone makes the most sense.  She is also on Augmentin and I encouraged her to take that until is complete.  Patient's blood sugar has trended downward with IV fluids.  Will give a bolus dose of insulin.  Will discharge.  PCP follow-up.  7:31 PM:  I have discussed the diagnosis/risks/treatment options with the patient and family.  Evaluation and diagnostic testing in the emergency department does not suggest an emergent condition requiring admission or immediate intervention beyond what has been performed at this time.  They will follow up with PCP. We also discussed returning to the ED immediately if new or worsening sx occur. We discussed the sx which are most concerning (e.g., sudden worsening pain, fever, inability to tolerate by mouth, confusion, vomiting, difficulty breathing) that necessitate immediate return. Medications administered to the patient during their visit and any new prescriptions provided to the patient are listed below.  Medications given during this visit Medications  insulin aspart (novoLOG) injection 10 Units (has no administration in time range)  sodium chloride 0.9 % bolus 1,000 mL (1,000 mLs Intravenous New Bag/Given 02/18/23 1834)     The patient appears reasonably screen and/or stabilized for discharge and I doubt any other medical condition or other [facility] requiring further screening, evaluation, or treatment in the ED at this time prior to discharge.          Final Clinical Impression(s) / ED Diagnoses Final diagnoses:  Hyperglycemia    Rx / DC Orders ED Discharge Orders     None         Melene Plan, DO 02/18/23 1931

## 2023-02-18 NOTE — ED Triage Notes (Signed)
Patient here POV from Home.  Endorses taking Prednisone recently (started yesterday) and since then has been having problems with Hyperglycemia.   No N/V/D. T1DM.   NAD Noted during Triage. A&Ox4. GCS 15. Ambulatory.

## 2023-02-18 NOTE — Discharge Instructions (Signed)
I would have you stop taking your prednisone.  Please follow-up with your family doctor in the office.

## 2023-03-11 ENCOUNTER — Other Ambulatory Visit: Payer: Self-pay

## 2023-03-11 ENCOUNTER — Emergency Department (HOSPITAL_BASED_OUTPATIENT_CLINIC_OR_DEPARTMENT_OTHER)
Admission: EM | Admit: 2023-03-11 | Discharge: 2023-03-11 | Disposition: A | Discharge status: Home / Self Care | Payer: BC Managed Care – PPO | Attending: Emergency Medicine | Admitting: Emergency Medicine

## 2023-03-11 ENCOUNTER — Encounter (HOSPITAL_BASED_OUTPATIENT_CLINIC_OR_DEPARTMENT_OTHER): Payer: Self-pay

## 2023-03-11 DIAGNOSIS — N3000 Acute cystitis without hematuria: Secondary | ICD-10-CM | POA: Diagnosis not present

## 2023-03-11 DIAGNOSIS — Z794 Long term (current) use of insulin: Secondary | ICD-10-CM | POA: Diagnosis not present

## 2023-03-11 DIAGNOSIS — E1065 Type 1 diabetes mellitus with hyperglycemia: Secondary | ICD-10-CM | POA: Insufficient documentation

## 2023-03-11 DIAGNOSIS — R739 Hyperglycemia, unspecified: Secondary | ICD-10-CM

## 2023-03-11 LAB — I-STAT VENOUS BLOOD GAS, ED
Acid-Base Excess: 0 mmol/L (ref 0.0–2.0)
Bicarbonate: 26.3 mmol/L (ref 20.0–28.0)
Calcium, Ion: 1.2 mmol/L (ref 1.15–1.40)
HCT: 36 % (ref 36.0–46.0)
Hemoglobin: 12.2 g/dL (ref 12.0–15.0)
O2 Saturation: 44 %
Patient temperature: 97.8
Potassium: 4.1 mmol/L (ref 3.5–5.1)
Sodium: 134 mmol/L — ABNORMAL LOW (ref 135–145)
TCO2: 28 mmol/L (ref 22–32)
pCO2, Ven: 46.9 mmHg (ref 44–60)
pH, Ven: 7.354 (ref 7.25–7.43)
pO2, Ven: 25 mmHg — CL (ref 32–45)

## 2023-03-11 LAB — BASIC METABOLIC PANEL
Anion gap: 6 (ref 5–15)
BUN: 13 mg/dL (ref 6–20)
CO2: 27 mmol/L (ref 22–32)
Calcium: 8.8 mg/dL — ABNORMAL LOW (ref 8.9–10.3)
Chloride: 98 mmol/L (ref 98–111)
Creatinine, Ser: 1 mg/dL (ref 0.44–1.00)
GFR, Estimated: >60 mL/min (ref >60)
Glucose, Bld: 406 mg/dL — ABNORMAL HIGH (ref 70–99)
Potassium: 3.9 mmol/L (ref 3.5–5.1)
Sodium: 131 mmol/L — ABNORMAL LOW (ref 135–145)

## 2023-03-11 LAB — CBC WITH DIFFERENTIAL/PLATELET
Abs Immature Granulocytes: 0.02 10*3/uL (ref 0.00–0.07)
Basophils Absolute: 0 10*3/uL (ref 0.0–0.1)
Basophils Relative: 1 %
Eosinophils Absolute: 0.6 10*3/uL — ABNORMAL HIGH (ref 0.0–0.5)
Eosinophils Relative: 10 %
HCT: 33.1 % — ABNORMAL LOW (ref 36.0–46.0)
Hemoglobin: 11.4 g/dL — ABNORMAL LOW (ref 12.0–15.0)
Immature Granulocytes: 0 %
Lymphocytes Relative: 20 %
Lymphs Abs: 1.2 10*3/uL (ref 0.7–4.0)
MCH: 30 pg (ref 26.0–34.0)
MCHC: 34.4 g/dL (ref 30.0–36.0)
MCV: 87.1 fL (ref 80.0–100.0)
Monocytes Absolute: 0.5 10*3/uL (ref 0.1–1.0)
Monocytes Relative: 9 %
Neutro Abs: 3.8 10*3/uL (ref 1.7–7.7)
Neutrophils Relative %: 60 %
Platelets: 290 10*3/uL (ref 150–400)
RBC: 3.8 MIL/uL — ABNORMAL LOW (ref 3.87–5.11)
RDW: 13.1 % (ref 11.5–15.5)
WBC: 6.2 10*3/uL (ref 4.0–10.5)
nRBC: 0 % (ref 0.0–0.2)

## 2023-03-11 LAB — URINALYSIS, ROUTINE W REFLEX MICROSCOPIC
Bilirubin Urine: NEGATIVE
Glucose, UA: >1000 mg/dL — AB
Hgb urine dipstick: NEGATIVE
Ketones, ur: NEGATIVE mg/dL
Nitrite: POSITIVE — AB
Protein, ur: NEGATIVE mg/dL
Specific Gravity, Urine: 1.022 (ref 1.005–1.030)
pH: 6.5 (ref 5.0–8.0)

## 2023-03-11 LAB — OSMOLALITY: Osmolality: 299 mOsm/kg — ABNORMAL HIGH (ref 275–295)

## 2023-03-11 LAB — BETA-HYDROXYBUTYRIC ACID: Beta-Hydroxybutyric Acid: 0.1 mmol/L (ref 0.05–0.27)

## 2023-03-11 LAB — CBG MONITORING, ED
Glucose-Capillary: 437 mg/dL — ABNORMAL HIGH (ref 70–99)
Glucose-Capillary: 248 mg/dL — ABNORMAL HIGH (ref 70–99)

## 2023-03-11 LAB — PREGNANCY, URINE: Preg Test, Ur: NEGATIVE

## 2023-03-11 MED ORDER — LACTATED RINGERS IV BOLUS
1000.0000 mL | Freq: Once | INTRAVENOUS | Status: AC
  Administered 2023-03-11: 1000 mL via INTRAVENOUS

## 2023-03-11 MED ORDER — DEXTROSE IN LACTATED RINGERS 5 % IV SOLN: INTRAVENOUS | Status: DC

## 2023-03-11 MED ORDER — LACTATED RINGERS IV SOLN: INTRAVENOUS | Status: DC

## 2023-03-11 MED ORDER — CEPHALEXIN 500 MG PO CAPS: 500.0000 mg | ORAL_CAPSULE | Freq: Four times a day (QID) | ORAL | 0 refills | Status: DC

## 2023-03-11 NOTE — ED Notes (Signed)
Discharge paperwork given and verbally understood. 

## 2023-03-11 NOTE — ED Notes (Signed)
Pt aware of the need for a urine... Unable to provide a sample/.Marland KitchenMarland Kitchen

## 2023-03-11 NOTE — ED Notes (Addendum)
Pts glucometer on her skin reads 312... Provider notified and was okay with not sticking the Pt again.Marland KitchenMarland Kitchen

## 2023-03-11 NOTE — ED Triage Notes (Signed)
In for eval of hyperglycemia, T1DM. New monitor Omnipod on Friday afternoon. Blood glucose starting to increase last pm around 1900. Changed out Omnipod at 0815. Self administered Novolog 12 units at 0900. Blood glucose currently 437. Possible sources of infection per patient: bilateral ear pain (recurrent ear infection - has taken 2 rounds of antibiotics and prednisone and currently taking Sudafed. Possible UTI - dysuria, urinary frequency. 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

## 2023-03-11 NOTE — Discharge Instructions (Addendum)
You were evaluated today for high blood sugar.  Your labs are reassuring and show no signs of DKA at this time.  It is unclear as to the cause of your hyperglycemia.  There may have been a malfunction with your OmniPod or it may be due to underlying infection.  You do have signs of a urinary tract infection.  I have sent a prescription for Keflex to the pharmacy.  Please take as directed until complete.  Urine culture has been sent as well.  Please contact your endocrinologist tomorrow for follow-up on your hypoglycemic episode.  If you develop any life-threatening symptoms such as hypoglycemia, altered mental status, significant hyperglycemia, please return immediately to the emergency department.

## 2023-03-11 NOTE — ED Notes (Signed)
PA verbal to cancel the D5 LR and only give the LR infusion at 120m/l Hr after the LR bolus.Marland KitchenMarland Kitchen

## 2023-03-11 NOTE — ED Notes (Signed)
I-Stat Venous done by RT.Marland KitchenMarland Kitchen

## 2023-03-11 NOTE — ED Provider Notes (Signed)
[place] EMERGENC[facility] DEPARTMENT AT [facility] Provider Note   CSN: [id number] Arrival date & time: 03/11/23  1026     History  Chief Complaint  Patient presents with   Hyperglycemia    [name] is a 42 y.o. female.  Patient presents to the emergency department for evaluation of hyperglycemia.  Patient is a type I diabetic.  Patient states that last night around 7:00 she noticed that her glucose had increasing difficulty upwards of 400+.  She was concerned that her OmniPod insulin delivery device may be malfunctioning.  She applied a new OmniPod this morning 815.  She also self administered 12 units of NovoLog at 9 AM this morning upon arrival at the emergency department her glucose was 437.  She states that she is concerned there may be sources of infection including bilateral ear discomfort (completed 2 courses of antibiotics and has clogged eustachian tube), currently taking Sudafed; dysuria and urinary frequency; red area on the medial left thigh just superior to the knee.  Patient with right BKA, uses prosthetic leg.  She denies nausea, vomiting, abdominal pain, shortness of breath at this time.  Past medical history is significant for type I DM with neuropathy, depression, right-sided BKA  HPI     Home Medications Prior to Admission medications   Medication Sig Start Date End Date Taking? Authorizing Provider  ARIPiprazole (ABILIFY) 5 MG tablet Take 1 tablet (5 mg total) by mouth daily. 05/21/19  Yes Remus Loffler, PA-C  cephALEXin (KEFLEX) 500 MG capsule Take 1 capsule (500 mg total) by mouth 4 (four) times daily. 03/11/23  Yes Darrick Grinder, PA-C  cetirizine (ZYRTEC) 10 MG tablet Take 10 mg by mouth daily.   Yes [provider]  Continuous Blood Gluc Sensor (DEXCOM G6 SENSOR) MISC 1 each by Does not apply route continuous. Please give any other equipment such as reader or transmitter. Patient has smart phone. 05/11/19  Yes Remus Loffler, PA-C   Eluxadoline (VIBERZI) 100 MG TABS Take 1 tablet (100 mg total) by mouth 2 (two) times daily. 05/21/19  Yes Remus Loffler, PA-C  escitalopram (LEXAPRO) 10 MG tablet Take 1 tablet (10 mg total) by mouth daily. 05/21/19  Yes Remus Loffler, PA-C  Ferrous Gluconate (IRON 27 PO) Take 1 tablet by mouth daily.    Yes [provider]  injection device for insulin (INPEN 100-PINK-NOVOLOG-FIASP) DEVI by Other route in the morning, at noon, and at bedtime. 9 units at breakfast 9 at lunch  5 units at supper 3 units for snacks 09/07/21  Yes [provider]  Insulin Pen Needle 32G X 4 MM MISC 1 Units by Does not apply route 5 (five) times daily. 05/21/19  Yes Remus Loffler, PA-C  amoxicillin-clavulanate (AUGMENTIN) 875-125 MG tablet Take 1 tablet by mouth 2 (two) times daily. 02/17/23   [provider]  doxycycline (VIBRA-TABS) 100 MG tablet Take 1 tablet (100 mg total) by mouth 2 (two) times daily. 1 po bid 07/15/19   Remus Loffler, PA-C  insulin degludec (TRESIBA) 100 UNIT/ML FlexTouch Pen Inject 20 Units into the skin daily. Does 26 units at night while on prednisone 02/06/23   [provider]  Insulin Glargine (LANTUS SOLOSTAR) 100 UNIT/ML Solostar Pen Inject 17-25 Units into the skin daily. 07/29/19   Remus Loffler, PA-C  lansoprazole (PREVACID) 30 MG capsule Take 1 capsule (30 mg total) by mouth daily at 12 noon. 05/21/19   Remus Loffler, PA-C  predniSONE [facility]  UNI-PAK 21 TAB) 10 MG (21) TBPK tablet Take 10 mg by mouth daily. Steroid taper pack 02/17/23   [provider]      Allergies    Morphine, Codeine, and Sulfa antibiotics    Review of Systems   Review of Systems  Physical Exam Updated Vital Signs BP (!) 124/90   Pulse 84   Temp 97.8 F (36.6 C)   Resp 12   Ht 5\' 4"  (1.626 m)   Wt 77.1 kg   SpO2 99%   BMI 29.18 kg/m  Physical Exam Vitals and nursing note reviewed.  Constitutional:      General: She is not in acute distress.     Appearance: She is well-developed.  HENT:     Head: Normocephalic and atraumatic.     Right Ear: Tympanic membrane normal.     Left Ear: Tympanic membrane normal.     Mouth/Throat:     Mouth: Mucous membranes are moist.  Eyes:     Conjunctiva/sclera: Conjunctivae normal.  Cardiovascular:     Rate and Rhythm: Normal rate and regular rhythm.     Heart sounds: No murmur heard. Pulmonary:     Effort: Pulmonary effort is normal. No respiratory distress.     Breath sounds: Normal breath sounds.  Abdominal:     Palpations: Abdomen is soft.     Tenderness: There is no abdominal tenderness.  Musculoskeletal:        General: No swelling.     Cervical back: Neck supple.  Skin:    General: Skin is warm and dry.     Capillary Refill: Capillary refill takes less than 2 seconds.     Findings: Erythema (Small erythematous area medial and superior to the right knee) present.  Neurological:     Mental Status: She is alert and oriented to person, place, and time.  Psychiatric:        Mood and Affect: Mood normal.     ED Results / Procedures / Treatments   Labs (all labs ordered are listed, but only abnormal results are displayed) Labs Reviewed  BASIC METABOLIC PANEL - Abnormal; Notable for the following components:      Result Value   Sodium 131 (*)    Glucose, Bld 406 (*)    Calcium 8.8 (*)    All other components within normal limits  CBC WITH DIFFERENTIAL/PLATELET - Abnormal; Notable for the following components:   RBC 3.80 (*)    Hemoglobin 11.4 (*)    HCT 33.1 (*)    Eosinophils Absolute 0.6 (*)    All other components within normal limits  URINALYSIS, ROUTINE W REFLEX MICROSCOPIC - Abnormal; Notable for the following components:   Glucose, UA >1,000 (*)    Nitrite POSITIVE (*)    Leukocytes,Ua SMALL (*)    Bacteria, UA FEW (*)    All other components within normal limits  CBG MONITORING, ED - Abnormal; Notable for the following components:   Glucose-Capillary 437 (*)    All  other components within normal limits  CBG MONITORING, ED - Abnormal; Notable for the following components:   Glucose-Capillary 248 (*)    All other components within normal limits  I-STAT VENOUS BLOOD GAS, ED - Abnormal; Notable for the following components:   pO2, Ven 25 (*)    Sodium 134 (*)    All other components within normal limits  URINE CULTURE  BETA-HYDROXYBUTYRIC ACID  PREGNANCY, URINE  OSMOLALITY    EKG None  Radiology No results found.  Procedures Procedures    Medications Ordered in ED Medications  lactated ringers bolus 1,000 mL (0 mLs Intravenous Stopped 03/11/23 1329)    ED Course/ Medical Decision Making/ A&P                             Medical Decision Making Amount and/or Complexity of Data Reviewed Labs: ordered.  Risk Prescription drug management.   This patient presents to the ED for concern of hyperglycemia, this involves an extensive number of treatment options, and is a complaint that carries with it a high risk of complications and morbidity.  The differential diagnosis includes hyperglycemia (not HHS or DKA)., DKA, HHS..   Co morbidities that complicate the patient evaluation  Type I DM   Additional history obtained:  Additional history obtained from family at bedside External records from outside source obtained and reviewed including family medicine notes   Lab Tests:  I Ordered, and personally interpreted labs.  The pertinent results include: Initial CBG greater than 400, repeat 248; grossly unremarkable venous blood gas showing no signs of DKA; BMP with sodium 131, glucose 406; UA showing signs of infection, positive for nitrites, leukocytes, bacteria, 21-50 WBC   Problem List / ED Course / Critical interventions / Medication management   I ordered medication including lactated Ringer's for fluid resuscitation Reevaluation of the patient after these medicines showed that the patient improved I have reviewed the patients  home medicines and have made adjustments as needed   Test / Admission - Considered:  Based on patient's presentation question if patient may have had a malfunctioning OmniPod.  Patient's Dexcom monitor appears to be grossly the same as our point-of-care glucose meters.  Her glucose is improved significantly since being here.  No signs of significant ear infection upon my examination.  There is a small area of redness on the medial left thigh, unclear if it could be a very mild cellulitis.  The patient does have signs of a UTI.  Plan to treat this with Keflex.  At this time feel comfortable discharging patient.  She feels comfortable with the plan and will plan to follow-up with endocrinology tomorrow.  Patient given strict return precautions including signs of hypoglycemia, hyperglycemia.  She will monitor her glucose with her Dexcom and if it appears her OmniPod is malfunctioning the patient plans to stop using the OmniPod and switch to injectable insulin.  There is no indication at this time for admission         Final Clinical Impression(s) / ED Diagnoses Final diagnoses:  Hyperglycemia  Acute cystitis without hematuria    Rx / DC Orders ED Discharge Orders          Ordered    cephALEXin (KEFLEX) 500 MG capsule  4 times daily        03/11/23 1353              Pamala Duffel 03/11/23 1354    Pricilla Loveless, MD 03/12/23 2317

## 2023-03-12 LAB — URINE CULTURE: Culture: >=100000 — AB

## 2023-03-13 LAB — URINE CULTURE

## 2023-03-14 ENCOUNTER — Telehealth (HOSPITAL_BASED_OUTPATIENT_CLINIC_OR_DEPARTMENT_OTHER): Payer: Self-pay

## 2023-03-14 NOTE — Telephone Encounter (Signed)
Post ED Visit - Positive Culture Follow-up  Culture report reviewed by antimicrobial stewardship pharmacist: [facility] []  Enzo Bi, Pharm.D. []  Celedonio Miyamoto, Pharm.D., BCPS AQ-ID [x]  Wilburn Cornelia, Pharm.D., BCPS []  Georgina Pillion, Pharm.D., BCPS []  [place], [place].D., BCPS, AAHIVP []  Estella Husk, Pharm.D., BCPS, AAHIVP []  Lysle Pearl, PharmD, BCPS []  Phillips Climes, PharmD, BCPS []  Agapito Games, PharmD, BCPS []  Verlan Friends, PharmD []  Mervyn Gay, PharmD, BCPS []  Vinnie Level, PharmD  [facility] []  Len Childs, PharmD []  Greer Pickerel, PharmD []  Adalberto Cole, PharmD []  Perlie Gold, Rph []  Lonell Face) Jean Rosenthal, PharmD []  Earl Many, PharmD []  Junita Push, PharmD []  Dorna Leitz, PharmD []  Terrilee Files, PharmD []  Lynann Beaver, PharmD []  Keturah Barre, PharmD []  Loralee Pacas, PharmD []  Bernadene Person, PharmD   Positive urine culture Treated with Cephalexin, organism sensitive to the same and no further patient follow-up is required at this time.  Sandria Senter 03/14/2023, 4:40 PM

## 2023-03-19 DIAGNOSIS — Z419 Encounter for procedure for purposes other than remedying health state, unspecified: Secondary | ICD-10-CM | POA: Diagnosis not present

## 2023-04-19 DIAGNOSIS — Z419 Encounter for procedure for purposes other than remedying health state, unspecified: Secondary | ICD-10-CM | POA: Diagnosis not present

## 2023-05-20 DIAGNOSIS — Z419 Encounter for procedure for purposes other than remedying health state, unspecified: Secondary | ICD-10-CM | POA: Diagnosis not present

## 2023-06-20 ENCOUNTER — Emergency Department (HOSPITAL_BASED_OUTPATIENT_CLINIC_OR_DEPARTMENT_OTHER)
Admission: EM | Admit: 2023-06-20 | Discharge: 2023-06-20 | Disposition: A | Discharge status: Home / Self Care | Payer: BC Managed Care – PPO | Attending: Emergency Medicine | Admitting: Emergency Medicine

## 2023-06-20 ENCOUNTER — Other Ambulatory Visit: Payer: Self-pay

## 2023-06-20 ENCOUNTER — Encounter (HOSPITAL_BASED_OUTPATIENT_CLINIC_OR_DEPARTMENT_OTHER): Payer: Self-pay | Admitting: Emergency Medicine

## 2023-06-20 DIAGNOSIS — N3000 Acute cystitis without hematuria: Secondary | ICD-10-CM | POA: Diagnosis not present

## 2023-06-20 DIAGNOSIS — R631 Polydipsia: Secondary | ICD-10-CM

## 2023-06-20 DIAGNOSIS — Z794 Long term (current) use of insulin: Secondary | ICD-10-CM | POA: Insufficient documentation

## 2023-06-20 DIAGNOSIS — E1065 Type 1 diabetes mellitus with hyperglycemia: Secondary | ICD-10-CM | POA: Diagnosis not present

## 2023-06-20 DIAGNOSIS — R739 Hyperglycemia, unspecified: Secondary | ICD-10-CM | POA: Diagnosis present

## 2023-06-20 DIAGNOSIS — E1165 Type 2 diabetes mellitus with hyperglycemia: Secondary | ICD-10-CM

## 2023-06-20 LAB — COMPREHENSIVE METABOLIC PANEL
ALT: 24 U/L (ref 0–44)
AST: 14 U/L — ABNORMAL LOW (ref 15–41)
Albumin: 4 g/dL (ref 3.5–5.0)
Alkaline Phosphatase: 73 U/L (ref 38–126)
Anion gap: 10 (ref 5–15)
BUN: 17 mg/dL (ref 6–20)
CO2: 23 mmol/L (ref 22–32)
Calcium: 9 mg/dL (ref 8.9–10.3)
Chloride: 93 mmol/L — ABNORMAL LOW (ref 98–111)
Creatinine, Ser: 1.25 mg/dL — ABNORMAL HIGH (ref 0.44–1.00)
GFR, Estimated: 55 mL/min — ABNORMAL LOW (ref >60)
Glucose, Bld: 760 mg/dL (ref 70–99)
Potassium: 3.9 mmol/L (ref 3.5–5.1)
Sodium: 126 mmol/L — ABNORMAL LOW (ref 135–145)
Total Bilirubin: 1 mg/dL (ref 0.3–1.2)
Total Protein: 7.3 g/dL (ref 6.5–8.1)

## 2023-06-20 LAB — URINALYSIS, ROUTINE W REFLEX MICROSCOPIC
Bilirubin Urine: NEGATIVE
Glucose, UA: >1000 mg/dL — AB
Hgb urine dipstick: NEGATIVE
Ketones, ur: 15 mg/dL — AB
Leukocytes,Ua: NEGATIVE
Nitrite: POSITIVE — AB
Protein, ur: NEGATIVE mg/dL
Specific Gravity, Urine: 1.026 (ref 1.005–1.030)
pH: 6 (ref 5.0–8.0)

## 2023-06-20 LAB — CBG MONITORING, ED
Glucose-Capillary: >600 mg/dL (ref 70–99)
Glucose-Capillary: 567 mg/dL (ref 70–99)
Glucose-Capillary: >600 mg/dL (ref 70–99)
Glucose-Capillary: 427 mg/dL — ABNORMAL HIGH (ref 70–99)

## 2023-06-20 LAB — CBC WITH DIFFERENTIAL/PLATELET
Abs Immature Granulocytes: 0.04 10*3/uL (ref 0.00–0.07)
Basophils Absolute: 0 10*3/uL (ref 0.0–0.1)
Basophils Relative: 1 %
Eosinophils Absolute: 0.8 10*3/uL — ABNORMAL HIGH (ref 0.0–0.5)
Eosinophils Relative: 11 %
HCT: 34.4 % — ABNORMAL LOW (ref 36.0–46.0)
Hemoglobin: 11.9 g/dL — ABNORMAL LOW (ref 12.0–15.0)
Immature Granulocytes: 1 %
Lymphocytes Relative: 22 %
Lymphs Abs: 1.6 10*3/uL (ref 0.7–4.0)
MCH: 30.4 pg (ref 26.0–34.0)
MCHC: 34.6 g/dL (ref 30.0–36.0)
MCV: 88 fL (ref 80.0–100.0)
Monocytes Absolute: 0.6 10*3/uL (ref 0.1–1.0)
Monocytes Relative: 8 %
Neutro Abs: 4.3 10*3/uL (ref 1.7–7.7)
Neutrophils Relative %: 57 %
Platelets: 295 10*3/uL (ref 150–400)
RBC: 3.91 MIL/uL (ref 3.87–5.11)
RDW: 13.1 % (ref 11.5–15.5)
WBC: 7.4 10*3/uL (ref 4.0–10.5)
nRBC: 0 % (ref 0.0–0.2)

## 2023-06-20 LAB — BETA-HYDROXYBUTYRIC ACID: Beta-Hydroxybutyric Acid: 1.28 mmol/L — ABNORMAL HIGH (ref 0.05–0.27)

## 2023-06-20 LAB — PREGNANCY, URINE: Preg Test, Ur: NEGATIVE

## 2023-06-20 MED ORDER — CEPHALEXIN 500 MG PO CAPS: 500.0000 mg | ORAL_CAPSULE | Freq: Two times a day (BID) | ORAL | 0 refills | Status: AC

## 2023-06-20 MED ORDER — POTASSIUM CHLORIDE 10 MEQ/100ML IV SOLN: 10.0000 meq | INTRAVENOUS | Status: DC

## 2023-06-20 MED ORDER — LACTATED RINGERS IV BOLUS: 1500.0000 mL | Freq: Once | INTRAVENOUS | Status: DC

## 2023-06-20 MED ORDER — SODIUM CHLORIDE 0.9 % IV SOLN
1.0000 g | Freq: Once | INTRAVENOUS | Status: AC
  Administered 2023-06-20: 1 g via INTRAVENOUS
  Filled 2023-06-20: qty 10

## 2023-06-20 MED ORDER — SODIUM CHLORIDE 0.9 % IV BOLUS
1000.0000 mL | Freq: Once | INTRAVENOUS | Status: AC
  Administered 2023-06-20: 1000 mL via INTRAVENOUS

## 2023-06-20 MED ORDER — INSULIN ASPART 100 UNIT/ML IV SOLN
6.0000 [IU] | Freq: Once | INTRAVENOUS | Status: AC
  Administered 2023-06-20: 6 [IU] via INTRAVENOUS

## 2023-06-20 MED ORDER — INSULIN ASPART 100 UNIT/ML IJ SOLN: 15.0000 [IU] | Freq: Once | INTRAMUSCULAR | Status: DC

## 2023-06-20 MED ORDER — POTASSIUM CHLORIDE 20 MEQ PO PACK
40.0000 meq | PACK | Freq: Once | ORAL | Status: AC
  Administered 2023-06-20: 40 meq via ORAL
  Filled 2023-06-20: qty 2

## 2023-06-20 MED ORDER — DEXTROSE 50 % IV SOLN: 0.0000 mL | INTRAVENOUS | Status: DC | PRN

## 2023-06-20 MED ORDER — LACTATED RINGERS IV SOLN: INTRAVENOUS | Status: DC

## 2023-06-20 MED ORDER — DEXTROSE IN LACTATED RINGERS 5 % IV SOLN: INTRAVENOUS | Status: DC

## 2023-06-20 NOTE — ED Notes (Signed)
Reviewed discharge instructions, medications, and home care with pt. Pt verbalized understanding and had no further questions. Pt exited ED without complications.

## 2023-06-20 NOTE — ED Triage Notes (Signed)
Pts dexcom stopped working 30 minutes ago,cbgis greater tahn 600 on our cbg

## 2023-06-20 NOTE — ED Triage Notes (Signed)
Pt's cbg has been over 400 since yesterday, she has tx with shots/insulin pump ,and is not responding.

## 2023-06-20 NOTE — ED Provider Notes (Signed)
[place] EMERGENCY DEPARTMENT AT [facility] Provider Note   CSN: [id number] Arrival date & time: 06/20/23  1724     History  No chief complaint on file.   [name] is a 42 y.o. female, DM I, who presents to the ED 2/2 elevated blood sugar.  She states that since 1 PM yesterday, her Dexcom has been reading that her blood sugar has been over 400.  She states it will not read anything since it is over 400.  She denies any runny nose, cough, sore throat, abdominal pain, nausea, vomiting.  Has been in good health as of lately.  Notes that since then she has given herself 40 units of NovoLog collectively in the last 24 hours, as well is using her pump for her regular insulin.  She notes that her sugar is just not going down, and now she has developed a sweet taste in her mouth, polyuria, polydipsia and fatigue.  She denies any abdominal pain, shortness of breath, nausea, vomiting, headache, or confusion.     Home Medications Prior to Admission medications   Medication Sig Start Date End Date Taking? Authorizing Provider  cephALEXin (KEFLEX) 500 MG capsule Take 1 capsule (500 mg total) by mouth 2 (two) times daily. 06/20/23  Yes Jeananne Bedwell L, PA  amoxicillin-clavulanate (AUGMENTIN) 875-125 MG tablet Take 1 tablet by mouth 2 (two) times daily. 02/17/23   [provider]  ARIPiprazole (ABILIFY) 5 MG tablet Take 1 tablet (5 mg total) by mouth daily. 05/21/19   Remus Loffler, PA-C  cetirizine (ZYRTEC) 10 MG tablet Take 10 mg by mouth daily.    [provider]  Continuous Blood Gluc Sensor (DEXCOM G6 SENSOR) MISC 1 each by Does not apply route continuous. Please give any other equipment such as reader or transmitter. Patient has smart phone. 05/11/19   Remus Loffler, PA-C  doxycycline (VIBRA-TABS) 100 MG tablet Take 1 tablet (100 mg total) by mouth 2 (two) times daily. 1 po bid 07/15/19   Remus Loffler, PA-C  Eluxadoline (VIBERZI) 100 MG TABS Take 1 tablet  (100 mg total) by mouth 2 (two) times daily. 05/21/19   Remus Loffler, PA-C  escitalopram (LEXAPRO) 10 MG tablet Take 1 tablet (10 mg total) by mouth daily. 05/21/19   Remus Loffler, PA-C  Ferrous Gluconate (IRON 27 PO) Take 1 tablet by mouth daily.     [provider]  injection device for insulin (INPEN 100-PINK-NOVOLOG-FIASP) DEVI by Other route in the morning, at noon, and at bedtime. 9 units at breakfast 9 at lunch  5 units at supper 3 units for snacks 09/07/21   [provider]  insulin degludec (TRESIBA) 100 UNIT/ML FlexTouch Pen Inject 20 Units into the skin daily. Does 26 units at night while on prednisone 02/06/23   [provider]  Insulin Glargine (LANTUS SOLOSTAR) 100 UNIT/ML Solostar Pen Inject 17-25 Units into the skin daily. 07/29/19   Remus Loffler, PA-C  Insulin Pen Needle 32G X 4 MM MISC 1 Units by Does not apply route 5 (five) times daily. 05/21/19   Remus Loffler, PA-C  lansoprazole (PREVACID) 30 MG capsule Take 1 capsule (30 mg total) by mouth daily at 12 noon. 05/21/19   Remus Loffler, PA-C  predniSONE (STERAPRED UNI-PAK 21 TAB) 10 MG (21) TBPK tablet Take 10 mg by mouth daily. Steroid taper pack 02/17/23   [provider]      Allergies    Morphine, Codeine, and Sulfa  antibiotics    Review of Systems   Review of Systems  Constitutional:  Negative for fever.  Respiratory:  Negative for shortness of breath.   Gastrointestinal:  Negative for abdominal pain.  Endocrine: Positive for polydipsia and polyuria.    Physical Exam Updated Vital Signs BP 100/79   Pulse 87   Temp 98.5 F (36.9 C) (Oral)   Resp 18   SpO2 100%  Physical Exam Vitals and nursing note reviewed.  Constitutional:      General: She is not in acute distress.    Appearance: She is well-developed.  HENT:     Head: Normocephalic and atraumatic.     Mouth/Throat:     Mouth: Mucous membranes are dry.  Eyes:     Conjunctiva/sclera: Conjunctivae normal.   Cardiovascular:     Rate and Rhythm: Normal rate and regular rhythm.     Heart sounds: No murmur heard. Pulmonary:     Effort: Pulmonary effort is normal. No respiratory distress.     Breath sounds: Normal breath sounds.  Abdominal:     Palpations: Abdomen is soft.     Tenderness: There is no abdominal tenderness.  Musculoskeletal:        General: No swelling.     Cervical back: Neck supple.  Skin:    General: Skin is warm and dry.     Capillary Refill: Capillary refill takes less than 2 seconds.  Neurological:     Mental Status: She is alert.  Psychiatric:        Mood and Affect: Mood normal.     ED Results / Procedures / Treatments   Labs (all labs ordered are listed, but only abnormal results are displayed) Labs Reviewed  CBC WITH DIFFERENTIAL/PLATELET - Abnormal; Notable for the following components:      Result Value   Hemoglobin 11.9 (*)    HCT 34.4 (*)    Eosinophils Absolute 0.8 (*)    All other components within normal limits  COMPREHENSIVE METABOLIC PANEL - Abnormal; Notable for the following components:   Sodium 126 (*)    Chloride 93 (*)    Glucose, Bld 760 (*)    Creatinine, Ser 1.25 (*)    AST 14 (*)    GFR, Estimated 55 (*)    All other components within normal limits  BETA-HYDROXYBUTYRIC ACID - Abnormal; Notable for the following components:   Beta-Hydroxybutyric Acid 1.28 (*)    All other components within normal limits  URINALYSIS, ROUTINE W REFLEX MICROSCOPIC - Abnormal; Notable for the following components:   Color, Urine COLORLESS (*)    Glucose, UA >1,000 (*)    Ketones, ur 15 (*)    Nitrite POSITIVE (*)    Bacteria, UA RARE (*)    All other components within normal limits  CBG MONITORING, ED - Abnormal; Notable for the following components:   Glucose-Capillary >600 (*)    All other components within normal limits  CBG MONITORING, ED - Abnormal; Notable for the following components:   Glucose-Capillary >600 (*)    All other components  within normal limits  CBG MONITORING, ED - Abnormal; Notable for the following components:   Glucose-Capillary 567 (*)    All other components within normal limits  CBG MONITORING, ED - Abnormal; Notable for the following components:   Glucose-Capillary 427 (*)    All other components within normal limits  PREGNANCY, URINE    EKG None  Radiology No results found.  Procedures Procedures    Medications Ordered in  ED Medications  lactated ringers bolus 1,500 mL (has no administration in time range)  lactated ringers infusion (has no administration in time range)  dextrose 5 % in lactated ringers infusion (has no administration in time range)  dextrose 50 % solution 0-50 mL (has no administration in time range)  sodium chloride 0.9 % bolus 1,000 mL (0 mLs Intravenous Stopped 06/20/23 2109)  cefTRIAXone (ROCEPHIN) 1 g in sodium chloride 0.9 % 100 mL IVPB (0 g Intravenous Stopped 06/20/23 2109)  insulin aspart (novoLOG) injection 6 Units (6 Units Intravenous Given 06/20/23 2049)  potassium chloride (KLOR-CON) packet 40 mEq (40 mEq Oral Given 06/20/23 2050)    ED Course/ Medical Decision Making/ A&P                                 Medical Decision Making Patient is a 42 year old female, here for polydipsia, polyuria, has been going on for the last day, she states her glucose has been ranging over 400, per Dexcom.  And she is concerned because she has been admitted for DKA before.  We will obtain beta hydroxybutyrate, blood work including CBC, CMP, for further evaluations and urinalysis.  She denies any chest pain, shortness of breath.  No invoking illnesses.  Will start some IV fluids.  Glucose currently is reading to 600+  Amount and/or Complexity of Data Reviewed Labs: ordered.    Details: No anion gap, per blood work, she does appear to be hyponatremic, but once corrected, secondary to her hyperglycemia, it is within normal limits.  Additionally she hyperglycemic, 760.  Mild ketones  in the urine.  Found to have a nitrate positive UTI. Discussion of management or test interpretation with external provider(s): We will treat her UTI with ceftriaxone and Keflex.  She was given 2 L of IV fluids, and 6 units of IV insulin, with improvement of her glucose to 400.  She is feeling much better, is requesting to be discharged home as she is a single mother, and states she cannot be hospitalized.  I believe that her symptoms are likely secondary to her hyperglycemia, she does not tend to meet criteria for HHS, or DKA at this time.  I discussed this with Dr. Wilkie Aye, who is in agreement.  She has new Dexcom, that she can replace, as well as she just replaced her insulin pump today.  She also has a home glucometer and is able available to meet with her primary care doctor this week.  We discussed return precautions and she voiced understanding.  Risk OTC drugs. Prescription drug management.    Final Clinical Impression(s) / ED Diagnoses Final diagnoses:  Hyperglycemia due to diabetes mellitus (HCC)  Polydipsia  Acute cystitis without hematuria    Rx / DC Orders ED Discharge Orders          Ordered    cephALEXin (KEFLEX) 500 MG capsule  2 times daily        06/20/23 2234              Surya Schroeter L, PA 06/20/23 2344    Rozelle Logan, DO 06/21/23 0002

## 2023-06-20 NOTE — Discharge Instructions (Addendum)
Please follow-up with your primary care doctor, I recommend changing your Dexcom, and using sliding scale insulin, for your hyperglycemia.  We are treating your urinary tract infection with Keflex, this may have caused your glucose to be elevated.  Watch this closely.  If you feel like your symptoms are worsening return to the ER immediately.  I also recommend using your at home glucometer, to double check your readings.  If it is over 400+ and you are having symptoms return to the ER immediately

## 2023-07-20 DIAGNOSIS — Z419 Encounter for procedure for purposes other than remedying health state, unspecified: Secondary | ICD-10-CM | POA: Diagnosis not present

## 2023-08-02 ENCOUNTER — Other Ambulatory Visit: Payer: Self-pay

## 2023-08-02 ENCOUNTER — Emergency Department (HOSPITAL_BASED_OUTPATIENT_CLINIC_OR_DEPARTMENT_OTHER)
Admission: EM | Admit: 2023-08-02 | Discharge: 2023-08-02 | Disposition: A | Discharge status: Home / Self Care | Payer: BC Managed Care – PPO | Attending: Emergency Medicine | Admitting: Emergency Medicine

## 2023-08-02 ENCOUNTER — Encounter (HOSPITAL_BASED_OUTPATIENT_CLINIC_OR_DEPARTMENT_OTHER): Payer: Self-pay | Admitting: Emergency Medicine

## 2023-08-02 ENCOUNTER — Emergency Department (HOSPITAL_BASED_OUTPATIENT_CLINIC_OR_DEPARTMENT_OTHER): Payer: BC Managed Care – PPO | Admitting: Radiology

## 2023-08-02 DIAGNOSIS — Z794 Long term (current) use of insulin: Secondary | ICD-10-CM | POA: Diagnosis not present

## 2023-08-02 DIAGNOSIS — Z7984 Long term (current) use of oral hypoglycemic drugs: Secondary | ICD-10-CM | POA: Diagnosis not present

## 2023-08-02 DIAGNOSIS — E119 Type 2 diabetes mellitus without complications: Secondary | ICD-10-CM | POA: Insufficient documentation

## 2023-08-02 DIAGNOSIS — R002 Palpitations: Secondary | ICD-10-CM | POA: Insufficient documentation

## 2023-08-02 LAB — CBC WITH DIFFERENTIAL/PLATELET
Abs Immature Granulocytes: 0.02 10*3/uL (ref 0.00–0.07)
Basophils Absolute: 0 10*3/uL (ref 0.0–0.1)
Basophils Relative: 1 %
Eosinophils Absolute: 0.7 10*3/uL — ABNORMAL HIGH (ref 0.0–0.5)
Eosinophils Relative: 11 %
HCT: 35.9 % — ABNORMAL LOW (ref 36.0–46.0)
Hemoglobin: 12.1 g/dL (ref 12.0–15.0)
Immature Granulocytes: 0 %
Lymphocytes Relative: 25 %
Lymphs Abs: 1.6 10*3/uL (ref 0.7–4.0)
MCH: 30.6 pg (ref 26.0–34.0)
MCHC: 33.7 g/dL (ref 30.0–36.0)
MCV: 90.7 fL (ref 80.0–100.0)
Monocytes Absolute: 0.5 10*3/uL (ref 0.1–1.0)
Monocytes Relative: 8 %
Neutro Abs: 3.7 10*3/uL (ref 1.7–7.7)
Neutrophils Relative %: 55 %
Platelets: 326 10*3/uL (ref 150–400)
RBC: 3.96 MIL/uL (ref 3.87–5.11)
RDW: 13.2 % (ref 11.5–15.5)
WBC: 6.6 10*3/uL (ref 4.0–10.5)
nRBC: 0 % (ref 0.0–0.2)

## 2023-08-02 LAB — BASIC METABOLIC PANEL
Anion gap: 7 (ref 5–15)
BUN: 20 mg/dL (ref 6–20)
CO2: 30 mmol/L (ref 22–32)
Calcium: 9.8 mg/dL (ref 8.9–10.3)
Chloride: 101 mmol/L (ref 98–111)
Creatinine, Ser: 1.32 mg/dL — ABNORMAL HIGH (ref 0.44–1.00)
GFR, Estimated: 51 mL/min — ABNORMAL LOW (ref >60)
Glucose, Bld: 293 mg/dL — ABNORMAL HIGH (ref 70–99)
Potassium: 4.1 mmol/L (ref 3.5–5.1)
Sodium: 138 mmol/L (ref 135–145)

## 2023-08-02 LAB — TROPONIN I (HIGH SENSITIVITY)
Troponin I (High Sensitivity): <2 ng/L (ref <18)
Troponin I (High Sensitivity): <2 ng/L (ref <18)

## 2023-08-02 NOTE — ED Triage Notes (Signed)
Heart palpitaion  and dizzy feeling this AM at work. Lasted 30 minutes. Has had multiple episodes like this. Schedule with cards next month

## 2023-08-02 NOTE — ED Provider Notes (Signed)
[place] EMERGENCY DEPARTMENT AT [facility] Provider Note   CSN: [id number] Arrival date & time: 08/02/23  1258     History Chief Complaint  Patient presents with   Palpitations    [name] is a 42 y.o. 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  She does state that she has had some worse episodes in the past that have resolved spontaneously.  This particular episode lasted about 30 minutes and did resolve spontaneously.  She visited the school nurse who took her blood pressure and heart rate and stated that she was hypotensive and tachycardic.  Patient did report associated dizziness with this but she did not lose consciousness.  She was also short of breath.  She currently denies chest pain, palpitations, shortness of breath and is currently asymptomatic.  She does have an appointment scheduled with cardiology next month with [facility].   Palpitations      Home Medications Prior to Admission medications   Medication Sig Start Date End Date Taking? Authorizing Provider  amoxicillin-clavulanate (AUGMENTIN) 875-125 MG tablet Take 1 tablet by mouth 2 (two) times daily. 02/17/23   [provider]  ARIPiprazole (ABILIFY) 5 MG tablet Take 1 tablet (5 mg total) by mouth daily. 05/21/19   Remus Loffler, PA-C  cephALEXin (KEFLEX) 500 MG capsule Take 1 capsule (500 mg total) by mouth 2 (two) times daily. 06/20/23   Small, Brooke L, PA  cetirizine (ZYRTEC) 10 MG tablet Take 10 mg by mouth daily.    [provider]  Continuous Blood Gluc Sensor (DEXCOM G6 SENSOR) MISC 1 each by Does not apply route continuous. Please give any other equipment such as reader or transmitter. Patient has smart phone. 05/11/19   Remus Loffler, PA-C  doxycycline (VIBRA-TABS) 100 MG tablet Take 1 tablet (100 mg  total) by mouth 2 (two) times daily. 1 po bid 07/15/19   Remus Loffler, PA-C  Eluxadoline (VIBERZI) 100 MG TABS Take 1 tablet (100 mg total) by mouth 2 (two) times daily. 05/21/19   Remus Loffler, PA-C  escitalopram (LEXAPRO) 10 MG tablet Take 1 tablet (10 mg total) by mouth daily. 05/21/19   Remus Loffler, PA-C  Ferrous Gluconate (IRON 27 PO) Take 1 tablet by mouth daily.     [provider]  injection device for insulin (INPEN 100-PINK-NOVOLOG-FIASP) DEVI by Other route in the morning, at noon, and at bedtime. 9 units at breakfast 9 at lunch  5 units at supper 3 units for snacks 09/07/21   [provider]  insulin degludec (TRESIBA) 100 UNIT/ML FlexTouch Pen Inject 20 Units into the skin daily. Does 26 units at night while on prednisone 02/06/23   [provider]  Insulin Glargine (LANTUS SOLOSTAR) 100 UNIT/ML Solostar Pen Inject 17-25 Units into the skin daily. 07/29/19   Remus Loffler, PA-C  Insulin Pen Needle 32G X 4 MM MISC 1 Units by Does not apply route 5 (five) times daily. 05/21/19   Remus Loffler, PA-C  lansoprazole (PREVACID) 30 MG capsule Take 1 capsule (30 mg total) by mouth daily at 12 noon. 05/21/19   Remus Loffler, PA-C  predniSONE (STERAPRED UNI-PAK 21 TAB) 10 MG (21) TBPK tablet Take 10 mg by mouth daily. Steroid taper pack 02/17/23   [provider]      Allergies    Morphine, Codeine,  and Sulfa antibiotics    Review of Systems   Review of Systems  Cardiovascular:  Positive for palpitations.  All other systems reviewed and are negative.   Physical Exam Updated Vital Signs BP 102/68   Pulse 81   Temp 98.4 F (36.9 C)   Resp 14   SpO2 97%  Physical Exam Vitals and nursing note reviewed.  Constitutional:      General: She is not in acute distress.    Appearance: Normal appearance.  HENT:     Head: Normocephalic and atraumatic.  Eyes:     General:        Right eye: No discharge.        Left eye: No discharge.  Cardiovascular:      Comments: Regular rate and rhythm.  S1/S2 are distinct without any evidence of murmur, rubs, or gallops.  Radial pulses are 2+ bilaterally.  Dorsalis pedis pulses are 2+ bilaterally.  No evidence of pedal edema. Pulmonary:     Comments: Clear to auscultation bilaterally.  Normal effort.  No respiratory distress.  No evidence of wheezes, rales, or rhonchi heard throughout. Abdominal:     General: Abdomen is flat. Bowel sounds are normal. There is no distension.     Tenderness: There is no abdominal tenderness. There is no guarding or rebound.  Musculoskeletal:        General: Normal range of motion.     Cervical back: Neck supple.  Skin:    General: Skin is warm and dry.     Findings: No rash.  Neurological:     General: No focal deficit present.     Mental Status: She is alert.  Psychiatric:        Mood and Affect: Mood normal.        Behavior: Behavior normal.     ED Results / Procedures / Treatments   Labs (all labs ordered are listed, but only abnormal results are displayed) Labs Reviewed  CBC WITH DIFFERENTIAL/PLATELET - Abnormal; Notable for the following components:      Result Value   HCT 35.9 (*)    Eosinophils Absolute 0.7 (*)    All other components within normal limits  BASIC METABOLIC PANEL - Abnormal; Notable for the following components:   Glucose, Bld 293 (*)    Creatinine, Ser 1.32 (*)    GFR, Estimated 51 (*)    All other components within normal limits  TROPONIN I (HIGH SENSITIVITY)  TROPONIN I (HIGH SENSITIVITY)    EKG None  Radiology No results found.  Procedures Procedures    Medications Ordered in ED Medications - No data to display  ED Course/ Medical Decision Making/ A&P Clinical Course as of 08/02/23 1741  Thu Aug 02, 2023  1530 CBC with Differential(!) Normal.  [CF]  1530 Troponin I (High Sensitivity) Initial troponin is negative. [CF]  1530 Basic metabolic panel(!) Normal electrolytes. Slight increase in creatinine in  comparison to baseline.  [CF]  1734 Troponin I (High Sensitivity) Delta troponin is normal.  [CF]  1737 On reevaluation, patient continues to be asymptomatic.  I went over all labs with her at the bedside.  Patient tolerating p.o.  She has an appointment with cardiology which I told her to keep.  She likely needs a ZIO patch.  Strict turn precautions were discussed.  She is safer discharge. [CF]    Clinical Course User Index [CF] Teressa Lower, PA-C   {   Click here for ABCD2, HEART and other calculators  Medical Decision Making [name] is a 41 y.o. female patient who presents to the emergency department today for further evaluation of palpitations.  Given the patient's clinical picture I will plan to get some basic labs including a troponin level to further assess.  We also get EKG and chest x-ray.  Currently vitals are normal and she is currently asymptomatic.  She is resting comfortable in the emergency department.  As highlighted in the ED course, all of her lab work is reassuring.  I personally interpreted the chest x-ray I have not see any evidence of cardiomegaly or pneumonia.  Radiology read is still pending but due to wait time I will likely discharge patient as this will likely did not change my management today.  She can follow-up with the results on MyChart.  She is safe for discharge at this time.  Amount and/or Complexity of Data Reviewed Labs: ordered. Decision-making details documented in ED Course. Radiology: ordered.   Final Clinical Impression(s) / ED Diagnoses Final diagnoses:  Palpitations    Rx / DC Orders ED Discharge Orders     None         Teressa Lower, PA-C 08/02/23 1741    Rozelle Logan, DO 08/07/23 1215

## 2023-08-02 NOTE — Discharge Instructions (Signed)
Everything look good today.  I would continue to hydrate with fluids as you are creatinine function was just a little bit above what is normal for you.  Keep your appointment with cardiology.  You may return to the emergency department for any worsening symptoms.

## 2023-08-19 DIAGNOSIS — Z419 Encounter for procedure for purposes other than remedying health state, unspecified: Secondary | ICD-10-CM | POA: Diagnosis not present

## 2023-09-19 DIAGNOSIS — Z419 Encounter for procedure for purposes other than remedying health state, unspecified: Secondary | ICD-10-CM | POA: Diagnosis not present

## 2023-10-20 DIAGNOSIS — Z419 Encounter for procedure for purposes other than remedying health state, unspecified: Secondary | ICD-10-CM | POA: Diagnosis not present

## 2023-11-17 DIAGNOSIS — Z419 Encounter for procedure for purposes other than remedying health state, unspecified: Secondary | ICD-10-CM | POA: Diagnosis not present

## 2023-12-29 DIAGNOSIS — Z419 Encounter for procedure for purposes other than remedying health state, unspecified: Secondary | ICD-10-CM | POA: Diagnosis not present

## 2024-01-28 DIAGNOSIS — Z419 Encounter for procedure for purposes other than remedying health state, unspecified: Secondary | ICD-10-CM | POA: Diagnosis not present

## 2024-02-28 DIAGNOSIS — Z419 Encounter for procedure for purposes other than remedying health state, unspecified: Secondary | ICD-10-CM | POA: Diagnosis not present

## 2024-03-29 DIAGNOSIS — Z419 Encounter for procedure for purposes other than remedying health state, unspecified: Secondary | ICD-10-CM | POA: Diagnosis not present

## 2024-04-29 DIAGNOSIS — Z419 Encounter for procedure for purposes other than remedying health state, unspecified: Secondary | ICD-10-CM | POA: Diagnosis not present

## 2024-05-30 DIAGNOSIS — Z419 Encounter for procedure for purposes other than remedying health state, unspecified: Secondary | ICD-10-CM | POA: Diagnosis not present

## 2024-06-26 ENCOUNTER — Encounter (HOSPITAL_BASED_OUTPATIENT_CLINIC_OR_DEPARTMENT_OTHER): Payer: Self-pay

## 2024-06-26 ENCOUNTER — Emergency Department (HOSPITAL_BASED_OUTPATIENT_CLINIC_OR_DEPARTMENT_OTHER)
Admission: EM | Admit: 2024-06-26 | Discharge: 2024-06-26 | Disposition: A | Discharge status: Home / Self Care | Attending: Emergency Medicine | Admitting: Emergency Medicine

## 2024-06-26 ENCOUNTER — Emergency Department (HOSPITAL_BASED_OUTPATIENT_CLINIC_OR_DEPARTMENT_OTHER)

## 2024-06-26 ENCOUNTER — Other Ambulatory Visit: Payer: Self-pay

## 2024-06-26 DIAGNOSIS — R739 Hyperglycemia, unspecified: Secondary | ICD-10-CM | POA: Diagnosis present

## 2024-06-26 DIAGNOSIS — R197 Diarrhea, unspecified: Secondary | ICD-10-CM | POA: Diagnosis not present

## 2024-06-26 DIAGNOSIS — Z794 Long term (current) use of insulin: Secondary | ICD-10-CM | POA: Insufficient documentation

## 2024-06-26 DIAGNOSIS — Z7984 Long term (current) use of oral hypoglycemic drugs: Secondary | ICD-10-CM | POA: Insufficient documentation

## 2024-06-26 DIAGNOSIS — E1165 Type 2 diabetes mellitus with hyperglycemia: Secondary | ICD-10-CM | POA: Insufficient documentation

## 2024-06-26 LAB — COMPREHENSIVE METABOLIC PANEL WITH GFR
ALT: 48 U/L — ABNORMAL HIGH (ref 0–44)
AST: 33 U/L (ref 15–41)
Albumin: 4.2 g/dL (ref 3.5–5.0)
Alkaline Phosphatase: 125 U/L (ref 38–126)
Anion gap: 14 (ref 5–15)
BUN: 22 mg/dL — ABNORMAL HIGH (ref 6–20)
CO2: 22 mmol/L (ref 22–32)
Calcium: 9.2 mg/dL (ref 8.9–10.3)
Chloride: 90 mmol/L — ABNORMAL LOW (ref 98–111)
Creatinine, Ser: 1.45 mg/dL — ABNORMAL HIGH (ref 0.44–1.00)
GFR, Estimated: 45 mL/min — ABNORMAL LOW (ref >60)
Glucose, Bld: 676 mg/dL (ref 70–99)
Potassium: 4 mmol/L (ref 3.5–5.1)
Sodium: 127 mmol/L — ABNORMAL LOW (ref 135–145)
Total Bilirubin: 0.9 mg/dL (ref 0.0–1.2)
Total Protein: 7.4 g/dL (ref 6.5–8.1)

## 2024-06-26 LAB — CBC
HCT: 35.4 % — ABNORMAL LOW (ref 36.0–46.0)
Hemoglobin: 12.2 g/dL (ref 12.0–15.0)
MCH: 30.7 pg (ref 26.0–34.0)
MCHC: 34.5 g/dL (ref 30.0–36.0)
MCV: 89.2 fL (ref 80.0–100.0)
Platelets: 290 K/uL (ref 150–400)
RBC: 3.97 MIL/uL (ref 3.87–5.11)
RDW: 12.7 % (ref 11.5–15.5)
WBC: 8.9 K/uL (ref 4.0–10.5)
nRBC: 0 % (ref 0.0–0.2)

## 2024-06-26 LAB — URINALYSIS, ROUTINE W REFLEX MICROSCOPIC
Bilirubin Urine: NEGATIVE
Glucose, UA: >1000 mg/dL — AB
Hgb urine dipstick: NEGATIVE
Ketones, ur: 15 mg/dL — AB
Leukocytes,Ua: NEGATIVE
Nitrite: NEGATIVE
Protein, ur: NEGATIVE mg/dL
Specific Gravity, Urine: 1.026 (ref 1.005–1.030)
pH: 5.5 (ref 5.0–8.0)

## 2024-06-26 LAB — I-STAT VENOUS BLOOD GAS, ED
Acid-base deficit: 2 mmol/L (ref 0.0–2.0)
Bicarbonate: 23.5 mmol/L (ref 20.0–28.0)
Calcium, Ion: 1.15 mmol/L (ref 1.15–1.40)
HCT: 34 % — ABNORMAL LOW (ref 36.0–46.0)
Hemoglobin: 11.6 g/dL — ABNORMAL LOW (ref 12.0–15.0)
O2 Saturation: 52 %
Potassium: 4 mmol/L (ref 3.5–5.1)
Sodium: 130 mmol/L — ABNORMAL LOW (ref 135–145)
TCO2: 25 mmol/L (ref 22–32)
pCO2, Ven: 40.6 mmHg — ABNORMAL LOW (ref 44–60)
pH, Ven: 7.371 (ref 7.25–7.43)
pO2, Ven: 29 mmHg — CL (ref 32–45)

## 2024-06-26 LAB — CBG MONITORING, ED
Glucose-Capillary: 244 mg/dL — ABNORMAL HIGH (ref 70–99)
Glucose-Capillary: 315 mg/dL — ABNORMAL HIGH (ref 70–99)
Glucose-Capillary: 427 mg/dL — ABNORMAL HIGH (ref 70–99)
Glucose-Capillary: 467 mg/dL — ABNORMAL HIGH (ref 70–99)
Glucose-Capillary: 481 mg/dL — ABNORMAL HIGH (ref 70–99)
Glucose-Capillary: >600 mg/dL (ref 70–99)

## 2024-06-26 LAB — BETA-HYDROXYBUTYRIC ACID: Beta-Hydroxybutyric Acid: 0.25 mmol/L (ref 0.05–0.27)

## 2024-06-26 LAB — LIPASE, BLOOD: Lipase: 19 U/L (ref 11–51)

## 2024-06-26 MED ORDER — DEXTROSE 50 % IV SOLN: 0.0000 mL | INTRAVENOUS | Status: DC | PRN

## 2024-06-26 MED ORDER — ONDANSETRON HCL 4 MG/2ML IJ SOLN
4.0000 mg | Freq: Once | INTRAMUSCULAR | Status: AC
  Administered 2024-06-26: 4 mg via INTRAVENOUS
  Filled 2024-06-26: qty 2

## 2024-06-26 MED ORDER — ONDANSETRON 8 MG PO TBDP: 8.0000 mg | ORAL_TABLET | Freq: Three times a day (TID) | ORAL | 0 refills | Status: AC | PRN

## 2024-06-26 MED ORDER — LACTATED RINGERS IV BOLUS
1000.0000 mL | Freq: Once | INTRAVENOUS | Status: AC
  Administered 2024-06-26: 1000 mL via INTRAVENOUS

## 2024-06-26 MED ORDER — INSULIN REGULAR(HUMAN) IN NACL 100-0.9 UT/100ML-% IV SOLN
INTRAVENOUS | Status: DC
  Administered 2024-06-26: 8 [IU]/h via INTRAVENOUS
  Filled 2024-06-26: qty 100

## 2024-06-26 MED ORDER — LACTATED RINGERS IV SOLN: INTRAVENOUS | Status: DC

## 2024-06-26 NOTE — ED Notes (Signed)
POC Glucose 481.

## 2024-06-26 NOTE — ED Triage Notes (Signed)
 Pt c/o diarrhea since Sunday, today I can't get my sugar to read & can't keep anything down. Advises endocrinologist told her to come get eval. Also c/o nausea. Denies abd pain

## 2024-06-26 NOTE — Discharge Instructions (Signed)
 Continue to monitor your blood sugar.  Follow-up with your endocrinologist to be rechecked.  Return to the emergency room for recurrent hyperglycemia vomiting or other concerning symptoms

## 2024-06-26 NOTE — ED Notes (Signed)
POC Glucose 244

## 2024-06-26 NOTE — ED Provider Notes (Signed)
 [place] EMERGENCY DEPARTMENT AT [facility] Provider Note   CSN: [id number] Arrival date & time: 06/26/24  1732     Patient presents with: Diarrhea   [name] is a 43 y.o. female.    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  Patient states she started having some trouble with diarrhea on Sunday.  She had several episodes.  That eventually resolved and she has not had any since then.  Patient started having trouble with nausea vomiting today.  She has not been able to keep anything down.  She also started to experience some mild abdominal discomfort.  She called her endocrinologist and was instructed to come to the ED as she has had elevated blood sugars.  She is also not sure if her insulin  pod is working correctly  Prior to Admission medications   Medication Sig Start Date End Date Taking? Authorizing Provider  ondansetron  (ZOFRAN -ODT) 8 MG disintegrating tablet Take 1 tablet (8 mg total) by mouth every 8 (eight) hours as needed for nausea or vomiting. 06/26/24  Yes Randol Simmonds, MD  amoxicillin-clavulanate (AUGMENTIN) 875-125 MG tablet Take 1 tablet by mouth 2 (two) times daily. 02/17/23   [provider]  ARIPiprazole  (ABILIFY ) 5 MG tablet Take 1 tablet (5 mg total) by mouth daily. 05/21/19   Joshua Clayborne RAMAN, PA-C  cephALEXin  (KEFLEX ) 500 MG capsule Take 1 capsule (500 mg total) by mouth 2 (two) times daily. 06/20/23   Small, Brooke L, PA  cetirizine (ZYRTEC) 10 MG tablet Take 10 mg by mouth daily.    [provider]  Continuous Blood Gluc Sensor (DEXCOM G6 SENSOR) MISC 1 each by Does not apply route continuous. Please give any other equipment such as reader or transmitter. Patient has smart phone. 05/11/19   Joshua Clayborne RAMAN, PA-C  doxycycline  (VIBRA -TABS) 100 MG tablet Take 1 tablet (100 mg total) by mouth 2 (two) times daily. 1 po bid 07/15/19    Joshua Clayborne RAMAN, PA-C  Eluxadoline  (VIBERZI ) 100 MG TABS Take 1 tablet (100 mg total) by mouth 2 (two) times daily. 05/21/19   Joshua Clayborne RAMAN, PA-C  escitalopram  (LEXAPRO ) 10 MG tablet Take 1 tablet (10 mg total) by mouth daily. 05/21/19   Joshua Clayborne RAMAN, PA-C  Ferrous Gluconate (IRON 27 PO) Take 1 tablet by mouth daily.     [provider]  injection device for insulin  (INPEN 100-PINK-NOVOLOG -FIASP ) DEVI by Other route in the morning, at noon, and at bedtime. 9 units at breakfast 9 at lunch  5 units at supper 3 units for snacks 09/07/21   [provider]  insulin  degludec (TRESIBA) 100 UNIT/ML FlexTouch Pen Inject 20 Units into the skin daily. Does 26 units at night while on prednisone 02/06/23   [provider]  Insulin  Glargine (LANTUS  SOLOSTAR) 100 UNIT/ML Solostar Pen Inject 17-25 Units into the skin daily. 07/29/19   Joshua Clayborne RAMAN, PA-C  Insulin  Pen Needle 32G X 4 MM MISC 1 Units by Does not apply route 5 (five) times daily. 05/21/19   Joshua Clayborne RAMAN, PA-C  lansoprazole  (PREVACID ) 30 MG capsule Take 1 capsule (30 mg total) by mouth daily at 12 noon. 05/21/19   Joshua Clayborne RAMAN, PA-C  predniSONE (STERAPRED UNI-PAK 21 TAB) 10 MG (21) TBPK tablet Take 10 mg by mouth daily. Steroid taper pack 02/17/23   [provider]    Allergies: Morphine , Codeine, and  Sulfa antibiotics    Review of Systems  Gastrointestinal:  Positive for diarrhea.    Updated Vital Signs BP 110/77 (BP Location: Right Arm)   Pulse 80   Temp 98.7 F (37.1 C) (Oral)   Resp 17   SpO2 99%   Physical Exam Vitals and nursing note reviewed.  Constitutional:      General: She is not in acute distress.    Appearance: She is well-developed.  HENT:     Head: Normocephalic and atraumatic.     Right Ear: External ear normal.     Left Ear: External ear normal.  Eyes:     General: No scleral icterus.       Right eye: No discharge.        Left eye: No discharge.     Conjunctiva/sclera:  Conjunctivae normal.  Neck:     Trachea: No tracheal deviation.  Cardiovascular:     Rate and Rhythm: Normal rate and regular rhythm.  Pulmonary:     Effort: Pulmonary effort is normal. No respiratory distress.     Breath sounds: Normal breath sounds. No stridor. No wheezing or rales.  Abdominal:     General: Bowel sounds are normal. There is no distension.     Palpations: Abdomen is soft.     Tenderness: There is abdominal tenderness. There is no guarding or rebound.     Comments: Mild tenderness upper abdomen  Musculoskeletal:        General: No tenderness or deformity.     Cervical back: Neck supple.  Skin:    General: Skin is warm and dry.     Findings: No rash.  Neurological:     General: No focal deficit present.     Mental Status: She is alert.     Cranial Nerves: No cranial nerve deficit, dysarthria or facial asymmetry.     Sensory: No sensory deficit.     Motor: No abnormal muscle tone or seizure activity.     Coordination: Coordination normal.  Psychiatric:        Mood and Affect: Mood normal.     (all labs ordered are listed, but only abnormal results are displayed) Labs Reviewed  COMPREHENSIVE METABOLIC PANEL WITH GFR - Abnormal; Notable for the following components:      Result Value   Sodium 127 (*)    Chloride 90 (*)    Glucose, Bld 676 (*)    BUN 22 (*)    Creatinine, Ser 1.45 (*)    ALT 48 (*)    GFR, Estimated 45 (*)    All other components within normal limits  CBC - Abnormal; Notable for the following components:   HCT 35.4 (*)    All other components within normal limits  URINALYSIS, ROUTINE W REFLEX MICROSCOPIC - Abnormal; Notable for the following components:   Color, Urine COLORLESS (*)    Glucose, UA >1,000 (*)    Ketones, ur 15 (*)    Bacteria, UA RARE (*)    All other components within normal limits  CBG MONITORING, ED - Abnormal; Notable for the following components:   Glucose-Capillary >600 (*)    All other components within normal  limits  I-STAT VENOUS BLOOD GAS, ED - Abnormal; Notable for the following components:   pCO2, Ven 40.6 (*)    pO2, Ven 29 (*)    Sodium 130 (*)    HCT 34.0 (*)    Hemoglobin 11.6 (*)    All other components within normal limits  CBG MONITORING, ED - Abnormal; Notable for the following components:   Glucose-Capillary 481 (*)    All other components within normal limits  CBG MONITORING, ED - Abnormal; Notable for the following components:   Glucose-Capillary 467 (*)    All other components within normal limits  CBG MONITORING, ED - Abnormal; Notable for the following components:   Glucose-Capillary 427 (*)    All other components within normal limits  CBG MONITORING, ED - Abnormal; Notable for the following components:   Glucose-Capillary 315 (*)    All other components within normal limits  LIPASE, BLOOD  BETA-HYDROXYBUTYRIC ACID    EKG: None  Radiology: US  Abdomen Limited RUQ (LIVER/GB) Result Date: 06/26/2024 EXAM: Right Upper Quadrant Abdominal Ultrasound TECHNIQUE: Real-time ultrasonography of the right upper quadrant of the abdomen was performed. COMPARISON: None. CLINICAL HISTORY: Abdominal pain. FINDINGS: LIVER: The liver demonstrates normal echogenicity. No intrahepatic biliary ductal dilatation. No mass. BILIARY SYSTEM: No evidence of pericholecystic fluid or wall thickening. No cholelithiasis. Negative sonographic Murphy's sign. Common bile duct is within normal limits measuring 3 mm in diameter proximally. RIGHT KIDNEY: The right kidney is grossly unremarkable in appearances without evidence of hydronephrosis, echogenic calculi or worrisome mass lesions. PANCREAS: Visualized portions of the pancreas are unremarkable. OTHER: No right upper quadrant ascites. IMPRESSION: 1. No acute findings. Electronically signed by: Dorethia Molt MD 06/26/2024 08:33 PM EDT RP Workstation: [id number]     Procedures   Medications Ordered in the ED  insulin  regular, human (MYXREDLIN) 100  units/ 100 mL infusion (6 Units/hr Intravenous Rate/Dose Change 06/26/24 2257)  lactated ringers  infusion ( Intravenous New Bag/Given 06/26/24 2102)  dextrose  50 % solution 0-50 mL (has no administration in time range)  lactated ringers  bolus 1,000 mL (0 mLs Intravenous Stopped 06/26/24 1944)  ondansetron  (ZOFRAN ) injection 4 mg (4 mg Intravenous Given 06/26/24 1839)    Clinical Course as of 06/26/24 2332  Thu Jun 26, 2024  1948 CBC normal.  Lipase normal.  Metabolic panel shows hyperglycemia with elevated BUN and creatinine but no elevated anion gap [JK]  2058 Ultrasound negative for acute findings [JK]    Clinical Course User Index [JK] Randol Simmonds, MD                                 Medical Decision Making Differential diagnosis includes but not limited to hyperglycemia DKA, electrolyte abnormality  Problems Addressed: Hyperglycemia: acute illness or injury that poses a threat to life or bodily functions  Amount and/or Complexity of Data Reviewed Labs: ordered. Decision-making details documented in ED Course. Radiology: ordered and independent interpretation performed.  Risk Prescription drug management.   Patient presented to ED with complaints of hyperglycemia.  Patient has had some GI symptoms as well recently.  She recently changed her insulin  pump and started having difficulty after that.  Laboratory tests notable for hyperglycemia but no signs of DKA.  Normal venous pH and no anion gap acidosis.    Patient does not have any signs of hepatitis or pancreatitis.  Low suspicion for colitis diverticulitis.  Patient was treated with IV fluids and insulin .  Her blood sugar has improved.  Patient does not have any vomiting and she is feeling well.  Suspect it is possible her insulin  pump was malfunctioning.  Patient is going to use a new pod.  Will have her continue to monitor her sugar closely and follow-up with her endocrinologist     Final  diagnoses:  Hyperglycemia    ED  Discharge Orders          Ordered    ondansetron  (ZOFRAN -ODT) 8 MG disintegrating tablet  Every 8 hours PRN        06/26/24 2329               Randol Simmonds, MD 06/26/24 2332

## 2024-06-28 ENCOUNTER — Other Ambulatory Visit: Payer: Self-pay

## 2024-06-28 ENCOUNTER — Emergency Department (HOSPITAL_BASED_OUTPATIENT_CLINIC_OR_DEPARTMENT_OTHER)
Admission: EM | Admit: 2024-06-28 | Discharge: 2024-06-28 | Disposition: A | Discharge status: Home / Self Care | Attending: Emergency Medicine | Admitting: Emergency Medicine

## 2024-06-28 DIAGNOSIS — Z794 Long term (current) use of insulin: Secondary | ICD-10-CM | POA: Insufficient documentation

## 2024-06-28 DIAGNOSIS — T50901A Poisoning by unspecified drugs, medicaments and biological substances, accidental (unintentional), initial encounter: Secondary | ICD-10-CM | POA: Diagnosis present

## 2024-06-28 DIAGNOSIS — T383X1A Poisoning by insulin and oral hypoglycemic [antidiabetic] drugs, accidental (unintentional), initial encounter: Secondary | ICD-10-CM | POA: Insufficient documentation

## 2024-06-28 DIAGNOSIS — E162 Hypoglycemia, unspecified: Secondary | ICD-10-CM | POA: Diagnosis not present

## 2024-06-28 DIAGNOSIS — X58XXXA Exposure to other specified factors, initial encounter: Secondary | ICD-10-CM | POA: Diagnosis not present

## 2024-06-28 LAB — BASIC METABOLIC PANEL WITH GFR
Anion gap: 13 (ref 5–15)
BUN: 11 mg/dL (ref 6–20)
CO2: 21 mmol/L — ABNORMAL LOW (ref 22–32)
Calcium: 8.3 mg/dL — ABNORMAL LOW (ref 8.9–10.3)
Chloride: 100 mmol/L (ref 98–111)
Creatinine, Ser: 1.04 mg/dL — ABNORMAL HIGH (ref 0.44–1.00)
GFR, Estimated: >60 mL/min (ref >60)
Glucose, Bld: 470 mg/dL — ABNORMAL HIGH (ref 70–99)
Potassium: 3.6 mmol/L (ref 3.5–5.1)
Sodium: 133 mmol/L — ABNORMAL LOW (ref 135–145)

## 2024-06-28 LAB — CBC WITH DIFFERENTIAL/PLATELET
Abs Immature Granulocytes: 0.02 K/uL (ref 0.00–0.07)
Basophils Absolute: 0 K/uL (ref 0.0–0.1)
Basophils Relative: 0 %
Eosinophils Absolute: 0.3 K/uL (ref 0.0–0.5)
Eosinophils Relative: 4 %
HCT: 34.7 % — ABNORMAL LOW (ref 36.0–46.0)
Hemoglobin: 12 g/dL (ref 12.0–15.0)
Immature Granulocytes: 0 %
Lymphocytes Relative: 13 %
Lymphs Abs: 1.1 K/uL (ref 0.7–4.0)
MCH: 30.9 pg (ref 26.0–34.0)
MCHC: 34.6 g/dL (ref 30.0–36.0)
MCV: 89.4 fL (ref 80.0–100.0)
Monocytes Absolute: 0.5 K/uL (ref 0.1–1.0)
Monocytes Relative: 6 %
Neutro Abs: 6.7 K/uL (ref 1.7–7.7)
Neutrophils Relative %: 77 %
Platelets: 278 K/uL (ref 150–400)
RBC: 3.88 MIL/uL (ref 3.87–5.11)
RDW: 13.1 % (ref 11.5–15.5)
WBC: 8.7 K/uL (ref 4.0–10.5)
nRBC: 0 % (ref 0.0–0.2)

## 2024-06-28 LAB — I-STAT VENOUS BLOOD GAS, ED
Acid-base deficit: 6 mmol/L — ABNORMAL HIGH (ref 0.0–2.0)
Bicarbonate: 20.5 mmol/L (ref 20.0–28.0)
Calcium, Ion: 1.14 mmol/L — ABNORMAL LOW (ref 1.15–1.40)
HCT: 35 % — ABNORMAL LOW (ref 36.0–46.0)
Hemoglobin: 11.9 g/dL — ABNORMAL LOW (ref 12.0–15.0)
O2 Saturation: 85 %
Potassium: 3.5 mmol/L (ref 3.5–5.1)
Sodium: 136 mmol/L (ref 135–145)
TCO2: 22 mmol/L (ref 22–32)
pCO2, Ven: 41.7 mmHg — ABNORMAL LOW (ref 44–60)
pH, Ven: 7.3 (ref 7.25–7.43)
pO2, Ven: 55 mmHg — ABNORMAL HIGH (ref 32–45)

## 2024-06-28 LAB — RESP PANEL BY RT-PCR (RSV, FLU A&B, COVID)  RVPGX2
Influenza A by PCR: NEGATIVE
Influenza B by PCR: NEGATIVE
Resp Syncytial Virus by PCR: NEGATIVE
SARS Coronavirus 2 by RT PCR: NEGATIVE

## 2024-06-28 LAB — CBG MONITORING, ED
Glucose-Capillary: 321 mg/dL — ABNORMAL HIGH (ref 70–99)
Glucose-Capillary: 456 mg/dL — ABNORMAL HIGH (ref 70–99)
Glucose-Capillary: 342 mg/dL — ABNORMAL HIGH (ref 70–99)
Glucose-Capillary: 266 mg/dL — ABNORMAL HIGH (ref 70–99)
Glucose-Capillary: 151 mg/dL — ABNORMAL HIGH (ref 70–99)

## 2024-06-28 LAB — BETA-HYDROXYBUTYRIC ACID: Beta-Hydroxybutyric Acid: 0.12 mmol/L (ref 0.05–0.27)

## 2024-06-28 MED ORDER — LACTATED RINGERS IV BOLUS
1500.0000 mL | Freq: Once | INTRAVENOUS | Status: AC
  Administered 2024-06-28: 1500 mL via INTRAVENOUS

## 2024-06-28 NOTE — ED Notes (Signed)
 VBG results given to Dr. Jerral.

## 2024-06-28 NOTE — ED Triage Notes (Signed)
 Pt's blood sugar was reading low and her mom was alerted. When her mom arrived at her house, pt was unresponsive and EMS was called. Pt had mountain dew, peanut butter sandwich and EMS administered glucose gel. Pt's CBG upon arrival here in triage is 321. Pt was seen here recently for hyperglycemia.

## 2024-06-28 NOTE — ED Provider Notes (Signed)
 Signout from Dr. Jerral.  43 year old female here with low blood sugar event.  She was recently here for elevated blood sugars.  She uses an insulin  pump.  She has been administering bolus insulin  due to elevated blood sugars and was found to be critically low overnight.  Lab work so far not consistent with DKA.  Plan is to follow-up on labs and response to fluids.  Possible discharge if sugars normalizing Physical Exam  BP 134/65   Pulse (!) 102   Temp 97.7 F (36.5 C) (Oral)   Resp 15   SpO2 100%   Physical Exam  Procedures  Procedures  ED Course / MDM    Medical Decision Making Amount and/or Complexity of Data Reviewed Labs: ordered.   9 AM.  Patient's IV fluid infusing.  She said she is feeling better and blood sugars are slowly coming down.  COVID and flu negative.  Will continue to monitor.  11 AM.  Patient received IV fluids.  Sugars normalizing.  She is comfortable plan for discharge.  Return instructions discussed     Towana Ozell BROCKS, MD 06/28/24 1055

## 2024-06-28 NOTE — ED Notes (Signed)
 Unsuccessful IV attempt RT hand, pt tolerated well. RN Tim notified

## 2024-06-28 NOTE — ED Notes (Signed)
~   25 min. Ago, pt. Ate graham crackers with peanut butter. She tells me she feels much better.

## 2024-06-28 NOTE — Discharge Instructions (Signed)
 Please keep hydrated and well fed.  Follow your blood sugars.  Follow-up with your endocrinologist.  Return to the emergency department if any worsening or concerning symptoms.

## 2024-06-28 NOTE — ED Provider Notes (Signed)
 [place] [facility] AT [facility] Provider Note   CSN: [id number] Arrival date & time: 06/28/24  0449     History Chief Complaint  Patient presents with  . Hypoglycemia  . Blood Sugar Problem    HPI [name] is a 43 y.o. female presenting for hypoglycemia.  Just seen yesterday for hyperglycemia thought to be secondary to failed insulin  pump.  Tonight, patient found to be unresponsive at home by family member who was triggered by an alert that patient's blood sugar was reading is low.  Patient states that she found her sugar to be high throughout the day today.  Bolused her insulin  today. I interrogated her device and her baseline insulin  is between 40 and 60 units in a day.  Yesterday she administered over 100 units Being out the maximum amount of the device would let her.  She she was reading as high blood sugar up until she went to bed.  (This device reads high anything above 400)  Patient's recorded medical, surgical, social, medication list and allergies were reviewed in the Snapshot window as part of the initial history.   Review of Systems   Review of Systems  Constitutional:  Negative for chills and fever.  HENT:  Negative for ear pain and sore throat.   Eyes:  Negative for pain and visual disturbance.  Respiratory:  Negative for cough and shortness of breath.   Cardiovascular:  Negative for chest pain and palpitations.  Gastrointestinal:  Negative for abdominal pain and vomiting.  Genitourinary:  Negative for dysuria and hematuria.  Musculoskeletal:  Negative for arthralgias and back pain.  Skin:  Negative for color change and rash.  Neurological:  Negative for seizures and syncope.  All other systems reviewed and are negative.   Physical Exam Updated Vital Signs BP 134/65   Pulse (!) 102   Temp 97.7 F (36.5 C) (Oral)   Resp 15   SpO2 100%  Physical Exam Vitals and nursing note reviewed.  Constitutional:      General: She  is not in acute distress.    Appearance: She is well-developed.  HENT:     Head: Normocephalic and atraumatic.  Eyes:     Conjunctiva/sclera: Conjunctivae normal.  Cardiovascular:     Rate and Rhythm: Normal rate and regular rhythm.     Heart sounds: No murmur heard. Pulmonary:     Effort: Pulmonary effort is normal. No respiratory distress.     Breath sounds: Normal breath sounds.  Abdominal:     General: There is no distension.     Palpations: Abdomen is soft.     Tenderness: There is no abdominal tenderness. There is no right CVA tenderness or left CVA tenderness.  Musculoskeletal:        General: No swelling or tenderness. Normal range of motion.     Cervical back: Neck supple.  Skin:    General: Skin is warm and dry.  Neurological:     General: No focal deficit present.     Mental Status: She is alert and oriented to person, place, and time. Mental status is at baseline.     Cranial Nerves: No cranial nerve deficit.      ED Course/ Medical Decision Making/ A&P     Procedures Procedures   Medications Ordered in ED Medications - No data to display  Medical Decision Making:   43 year old female presenting with hypoglycemic episode.  Enough to cause syncope last night.  This is secondary to an insulin  overdose  of nearly double her daily dose (baseline is 50, administered to 100 units yesterday.) it was accidental.  Patient did not know how to see how much she had administered throughout the day and was unaware as to the doses that the machine had been giving her (0.4 units every 15 minutes in addition to 2 separate 20 unit boluses administered by herself.  She then went to sleep and her blood sugar rapidly dropped. She is endorsing nausea vomiting diarrhea with likely GI symptoms causing her elevated blood sugar.  Denies any systemic illness, fevers, severe abdominal pain.  Patient will need prolonged observation window given degree of hypoglycemia (undetectably low) and  ensure not recurrent events.  Every hour blood glucose is checked, lab work ordered to ensure that patient does not have DKA as etiology of her reported GI symptoms and patient will be observed into the morning to ensure restoration of normal function. Notably she states she has her daughter's home coming tonight and she wants to be discharged rather than be admitted for observation. Care of patient handed off to oncoming team for serial reassessments and observation of her glucose..  Clinical Impression:  1. Hypoglycemia   2. Insulin  overdose, accidental or unintentional, initial encounter      Data Unavailable   Final Clinical Impression(s) / ED Diagnoses Final diagnoses:  Hypoglycemia  Insulin  overdose, accidental or unintentional, initial encounter    Rx / DC Orders ED Discharge Orders     None         Jerral Meth, MD 06/28/24 [id number]
